# Patient Record
Sex: Female | Born: 1935 | Hispanic: No | Smoking: Former smoker
Health system: Southern US, Community
[De-identification: ages and names within clinical notes are randomized; demographics above are authoritative.]

## PROBLEM LIST (undated history)

## (undated) DIAGNOSIS — R109 Unspecified abdominal pain: Secondary | ICD-10-CM

## (undated) DIAGNOSIS — R51 Headache: Secondary | ICD-10-CM

## (undated) DIAGNOSIS — J301 Allergic rhinitis due to pollen: Secondary | ICD-10-CM

## (undated) DIAGNOSIS — I714 Abdominal aortic aneurysm, without rupture, unspecified: Secondary | ICD-10-CM

## (undated) DIAGNOSIS — G4733 Obstructive sleep apnea (adult) (pediatric): Secondary | ICD-10-CM

## (undated) DIAGNOSIS — I2510 Atherosclerotic heart disease of native coronary artery without angina pectoris: Secondary | ICD-10-CM

## (undated) DIAGNOSIS — K219 Gastro-esophageal reflux disease without esophagitis: Secondary | ICD-10-CM

## (undated) DIAGNOSIS — J209 Acute bronchitis, unspecified: Secondary | ICD-10-CM

## (undated) DIAGNOSIS — I10 Essential (primary) hypertension: Secondary | ICD-10-CM

## (undated) DIAGNOSIS — M1990 Unspecified osteoarthritis, unspecified site: Secondary | ICD-10-CM

## (undated) DIAGNOSIS — E7800 Pure hypercholesterolemia, unspecified: Secondary | ICD-10-CM

## (undated) DIAGNOSIS — N182 Chronic kidney disease, stage 2 (mild): Secondary | ICD-10-CM

## (undated) DIAGNOSIS — I2699 Other pulmonary embolism without acute cor pulmonale: Secondary | ICD-10-CM

## (undated) DIAGNOSIS — R413 Other amnesia: Secondary | ICD-10-CM

## (undated) HISTORY — DX: Abdominal aortic aneurysm, without rupture: I71.4

## (undated) HISTORY — DX: Unspecified osteoarthritis, unspecified site: M19.90

## (undated) HISTORY — DX: Essential (primary) hypertension: I10

## (undated) HISTORY — DX: Pure hypercholesterolemia, unspecified: E78.00

## (undated) HISTORY — DX: Allergic rhinitis due to pollen: J30.1

## (undated) HISTORY — DX: Other pulmonary embolism without acute cor pulmonale: I26.99

## (undated) HISTORY — DX: Other amnesia: R41.3

## (undated) HISTORY — DX: Obstructive sleep apnea (adult) (pediatric): G47.33

## (undated) HISTORY — DX: Acute bronchitis, unspecified: J20.9

## (undated) HISTORY — DX: Unspecified abdominal pain: R10.9

## (undated) HISTORY — DX: Atherosclerotic heart disease of native coronary artery without angina pectoris: I25.10

## (undated) HISTORY — DX: Abdominal aortic aneurysm, without rupture, unspecified: I71.40

## (undated) HISTORY — DX: Chronic kidney disease, stage 2 (mild): N18.2

## (undated) HISTORY — PX: VASCULAR SURGERY: SHX849

---

## 1998-08-04 ENCOUNTER — Ambulatory Visit (HOSPITAL_COMMUNITY): Admission: RE | Admit: 1998-08-04 | Discharge: 1998-08-04 | Payer: Self-pay | Admitting: Internal Medicine

## 1999-03-15 ENCOUNTER — Ambulatory Visit (HOSPITAL_COMMUNITY): Admission: RE | Admit: 1999-03-15 | Discharge: 1999-03-15 | Payer: Self-pay | Admitting: Gastroenterology

## 1999-12-02 ENCOUNTER — Other Ambulatory Visit: Admission: RE | Admit: 1999-12-02 | Discharge: 1999-12-02 | Payer: Self-pay | Admitting: Internal Medicine

## 2001-01-31 ENCOUNTER — Encounter: Payer: Self-pay | Admitting: Emergency Medicine

## 2001-01-31 ENCOUNTER — Emergency Department (HOSPITAL_COMMUNITY): Admission: EM | Admit: 2001-01-31 | Discharge: 2001-01-31 | Payer: Self-pay | Admitting: Emergency Medicine

## 2001-06-01 ENCOUNTER — Encounter: Payer: Self-pay | Admitting: Internal Medicine

## 2001-06-01 ENCOUNTER — Encounter: Admission: RE | Admit: 2001-06-01 | Discharge: 2001-06-01 | Payer: Self-pay | Admitting: Internal Medicine

## 2002-01-08 ENCOUNTER — Encounter: Admission: RE | Admit: 2002-01-08 | Discharge: 2002-01-08 | Payer: Self-pay | Admitting: Internal Medicine

## 2002-01-08 ENCOUNTER — Encounter: Payer: Self-pay | Admitting: Internal Medicine

## 2003-05-05 ENCOUNTER — Ambulatory Visit (HOSPITAL_COMMUNITY): Admission: RE | Admit: 2003-05-05 | Discharge: 2003-05-05 | Payer: Self-pay | Admitting: Cardiology

## 2003-05-05 ENCOUNTER — Encounter: Payer: Self-pay | Admitting: Cardiology

## 2003-11-05 ENCOUNTER — Encounter: Admission: RE | Admit: 2003-11-05 | Discharge: 2003-11-05 | Payer: Self-pay | Admitting: Internal Medicine

## 2004-02-13 ENCOUNTER — Inpatient Hospital Stay (HOSPITAL_COMMUNITY): Admission: EM | Admit: 2004-02-13 | Discharge: 2004-02-23 | Payer: Self-pay | Admitting: Emergency Medicine

## 2004-03-31 ENCOUNTER — Encounter: Admission: RE | Admit: 2004-03-31 | Discharge: 2004-03-31 | Payer: Self-pay | Admitting: Internal Medicine

## 2004-05-03 ENCOUNTER — Encounter: Admission: RE | Admit: 2004-05-03 | Discharge: 2004-05-03 | Payer: Self-pay | Admitting: Internal Medicine

## 2004-10-06 ENCOUNTER — Ambulatory Visit (HOSPITAL_COMMUNITY): Admission: RE | Admit: 2004-10-06 | Discharge: 2004-10-06 | Payer: Self-pay | Admitting: Cardiology

## 2005-02-28 ENCOUNTER — Ambulatory Visit (HOSPITAL_COMMUNITY): Admission: RE | Admit: 2005-02-28 | Discharge: 2005-02-28 | Payer: Self-pay | Admitting: Gastroenterology

## 2005-09-03 ENCOUNTER — Emergency Department (HOSPITAL_COMMUNITY): Admission: EM | Admit: 2005-09-03 | Discharge: 2005-09-03 | Payer: Self-pay | Admitting: Emergency Medicine

## 2006-02-16 ENCOUNTER — Encounter: Admission: RE | Admit: 2006-02-16 | Discharge: 2006-02-16 | Payer: Self-pay | Admitting: Internal Medicine

## 2006-10-10 ENCOUNTER — Ambulatory Visit (HOSPITAL_BASED_OUTPATIENT_CLINIC_OR_DEPARTMENT_OTHER): Admission: RE | Admit: 2006-10-10 | Discharge: 2006-10-10 | Payer: Self-pay | Admitting: Internal Medicine

## 2006-10-15 ENCOUNTER — Ambulatory Visit: Payer: Self-pay | Admitting: Internal Medicine

## 2007-09-03 ENCOUNTER — Encounter: Admission: RE | Admit: 2007-09-03 | Discharge: 2007-09-03 | Payer: Self-pay | Admitting: Internal Medicine

## 2008-01-16 ENCOUNTER — Ambulatory Visit (HOSPITAL_COMMUNITY): Admission: RE | Admit: 2008-01-16 | Discharge: 2008-01-16 | Payer: Self-pay | Admitting: Cardiology

## 2008-01-17 ENCOUNTER — Emergency Department (HOSPITAL_COMMUNITY): Admission: EM | Admit: 2008-01-17 | Discharge: 2008-01-17 | Payer: Self-pay | Admitting: Emergency Medicine

## 2008-03-21 ENCOUNTER — Other Ambulatory Visit: Admission: RE | Admit: 2008-03-21 | Discharge: 2008-03-21 | Payer: Self-pay | Admitting: Internal Medicine

## 2008-11-07 HISTORY — PX: EYE SURGERY: SHX253

## 2009-05-05 ENCOUNTER — Encounter: Admission: RE | Admit: 2009-05-05 | Discharge: 2009-05-05 | Payer: Self-pay | Admitting: Internal Medicine

## 2009-06-01 ENCOUNTER — Encounter: Admission: RE | Admit: 2009-06-01 | Discharge: 2009-06-01 | Payer: Self-pay | Admitting: Internal Medicine

## 2009-07-03 ENCOUNTER — Ambulatory Visit (HOSPITAL_COMMUNITY): Admission: RE | Admit: 2009-07-03 | Discharge: 2009-07-03 | Payer: Self-pay | Admitting: Cardiology

## 2009-09-15 ENCOUNTER — Encounter: Admission: RE | Admit: 2009-09-15 | Discharge: 2009-09-15 | Payer: Self-pay | Admitting: Internal Medicine

## 2009-12-04 ENCOUNTER — Encounter: Admission: RE | Admit: 2009-12-04 | Discharge: 2009-12-04 | Payer: Self-pay | Admitting: Internal Medicine

## 2010-04-08 ENCOUNTER — Ambulatory Visit: Payer: Self-pay | Admitting: Cardiovascular Disease

## 2010-04-08 ENCOUNTER — Inpatient Hospital Stay (HOSPITAL_COMMUNITY): Admission: EM | Admit: 2010-04-08 | Discharge: 2010-04-14 | Payer: Self-pay | Admitting: Emergency Medicine

## 2010-04-09 ENCOUNTER — Encounter (INDEPENDENT_AMBULATORY_CARE_PROVIDER_SITE_OTHER): Payer: Self-pay | Admitting: Internal Medicine

## 2010-04-13 ENCOUNTER — Ambulatory Visit: Payer: Self-pay | Admitting: Thoracic Surgery (Cardiothoracic Vascular Surgery)

## 2010-06-25 ENCOUNTER — Encounter: Admission: RE | Admit: 2010-06-25 | Discharge: 2010-06-25 | Payer: Self-pay | Admitting: Internal Medicine

## 2010-11-04 ENCOUNTER — Encounter
Admission: RE | Admit: 2010-11-04 | Discharge: 2010-11-04 | Payer: Self-pay | Source: Home / Self Care | Attending: Internal Medicine | Admitting: Internal Medicine

## 2010-11-28 ENCOUNTER — Encounter: Payer: Self-pay | Admitting: Internal Medicine

## 2010-11-28 ENCOUNTER — Encounter: Payer: Self-pay | Admitting: Cardiology

## 2011-01-24 LAB — POCT I-STAT, CHEM 8
BUN: 26 mg/dL — ABNORMAL HIGH (ref 6–23)
Calcium, Ion: 1.25 mmol/L (ref 1.12–1.32)
Creatinine, Ser: 1.4 mg/dL — ABNORMAL HIGH (ref 0.4–1.2)
Glucose, Bld: 84 mg/dL (ref 70–99)
Hemoglobin: 12.6 g/dL (ref 12.0–15.0)
Sodium: 140 mEq/L (ref 135–145)
TCO2: 28 mmol/L (ref 0–100)

## 2011-01-24 LAB — LIPID PANEL
Cholesterol: 153 mg/dL (ref 0–200)
HDL: 75 mg/dL (ref >39)
LDL Cholesterol: 67 mg/dL (ref 0–99)
Triglycerides: 54 mg/dL (ref <150)

## 2011-01-24 LAB — DIFFERENTIAL
Basophils Relative: 0 % (ref 0–1)
Lymphocytes Relative: 14 % (ref 12–46)
Lymphs Abs: 0.9 10*3/uL (ref 0.7–4.0)
Monocytes Absolute: 0.2 10*3/uL (ref 0.1–1.0)
Monocytes Relative: 3 % (ref 3–12)
Neutro Abs: 5.4 10*3/uL (ref 1.7–7.7)
Neutrophils Relative %: 83 % — ABNORMAL HIGH (ref 43–77)

## 2011-01-24 LAB — CBC
Hemoglobin: 12 g/dL (ref 12.0–15.0)
MCHC: 34.4 g/dL (ref 30.0–36.0)
Platelets: 137 10*3/uL — ABNORMAL LOW (ref 150–400)
Platelets: 143 10*3/uL — ABNORMAL LOW (ref 150–400)
RBC: 3.67 MIL/uL — ABNORMAL LOW (ref 3.87–5.11)
RBC: 3.74 MIL/uL — ABNORMAL LOW (ref 3.87–5.11)
WBC: 4.6 10*3/uL (ref 4.0–10.5)
WBC: 5.1 10*3/uL (ref 4.0–10.5)
WBC: 6.5 10*3/uL (ref 4.0–10.5)

## 2011-01-24 LAB — PROTIME-INR
INR: 1.81 — ABNORMAL HIGH (ref 0.00–1.49)
INR: 2.4 — ABNORMAL HIGH (ref 0.00–1.49)
INR: 2.74 — ABNORMAL HIGH (ref 0.00–1.49)
INR: 2.86 — ABNORMAL HIGH (ref 0.00–1.49)
Prothrombin Time: 20.8 seconds — ABNORMAL HIGH (ref 11.6–15.2)
Prothrombin Time: 21.3 seconds — ABNORMAL HIGH (ref 11.6–15.2)
Prothrombin Time: 28.8 seconds — ABNORMAL HIGH (ref 11.6–15.2)

## 2011-01-24 LAB — COMPREHENSIVE METABOLIC PANEL
AST: 28 U/L (ref 0–37)
Albumin: 3.4 g/dL — ABNORMAL LOW (ref 3.5–5.2)
Alkaline Phosphatase: 42 U/L (ref 39–117)
Chloride: 108 mEq/L (ref 96–112)
GFR calc Af Amer: 49 mL/min — ABNORMAL LOW (ref >60)
Potassium: 3.8 mEq/L (ref 3.5–5.1)
Sodium: 138 mEq/L (ref 135–145)
Total Bilirubin: 0.6 mg/dL (ref 0.3–1.2)

## 2011-01-24 LAB — POCT CARDIAC MARKERS: Myoglobin, poc: 140 ng/mL (ref 12–200)

## 2011-01-24 LAB — HEPATIC FUNCTION PANEL
ALT: 14 U/L (ref 0–35)
AST: 20 U/L (ref 0–37)
Albumin: 3.7 g/dL (ref 3.5–5.2)
Alkaline Phosphatase: 44 U/L (ref 39–117)
Bilirubin, Direct: 0.1 mg/dL (ref 0.0–0.3)
Total Bilirubin: 0.7 mg/dL (ref 0.3–1.2)

## 2011-01-24 LAB — URINALYSIS, ROUTINE W REFLEX MICROSCOPIC
Glucose, UA: NEGATIVE mg/dL
Ketones, ur: NEGATIVE mg/dL
Nitrite: NEGATIVE
Protein, ur: NEGATIVE mg/dL
pH: 6 (ref 5.0–8.0)

## 2011-01-24 LAB — CULTURE, BLOOD (ROUTINE X 2): Culture: NO GROWTH

## 2011-01-24 LAB — D-DIMER, QUANTITATIVE: D-Dimer, Quant: 0.72 ug/mL-FEU — ABNORMAL HIGH (ref 0.00–0.48)

## 2011-01-24 LAB — BASIC METABOLIC PANEL
CO2: 28 mEq/L (ref 19–32)
Chloride: 106 mEq/L (ref 96–112)
GFR calc Af Amer: 53 mL/min — ABNORMAL LOW (ref >60)
Potassium: 4 mEq/L (ref 3.5–5.1)

## 2011-01-24 LAB — URINE MICROSCOPIC-ADD ON

## 2011-01-24 LAB — URINE CULTURE: Colony Count: 50000

## 2011-01-24 LAB — CARDIAC PANEL(CRET KIN+CKTOT+MB+TROPI)
CK, MB: 1.2 ng/mL (ref 0.3–4.0)
Relative Index: 1.1 (ref 0.0–2.5)
Total CK: 105 U/L (ref 7–177)
Troponin I: 0.06 ng/mL (ref 0.00–0.06)

## 2011-01-24 LAB — CK TOTAL AND CKMB (NOT AT ARMC)
CK, MB: 1.2 ng/mL (ref 0.3–4.0)
Total CK: 97 U/L (ref 7–177)

## 2011-01-24 LAB — TSH: TSH: 0.59 u[IU]/mL (ref 0.350–4.500)

## 2011-02-18 ENCOUNTER — Encounter: Payer: Self-pay | Admitting: Internal Medicine

## 2011-03-25 NOTE — Discharge Summary (Signed)
Alexandria Walton, Alexandria Walton                          ACCOUNT NO.:  [id number]   MEDICAL RECORD NO.:  [id number]                   PATIENT TYPE:  INP   LOCATION:  3727                                 FACILITY:  [facility]   PHYSICIAN:  Mohan N. Sharyn Lull, M.D.              DATE OF BIRTH:  [birth year]   DATE OF ADMISSION:  02/13/2004  DATE OF DISCHARGE:  02/23/2004                                 DISCHARGE SUMMARY   ADMISSION DIAGNOSES:  1. New onset angina, rule out myocardial infarction.  2. Hypertension.  3. Hypercholesterolemia.  4. Positive family history of coronary artery disease.   DISCHARGE DIAGNOSES:  1. Mild coronary artery disease.  2. Bilateral pulmonary embolism.  3. Hypertension.  4. Diffuse thoracic aortic aneurysm.  5. Hypercholesterolemia.  6. Hyperhomocysteinemia.  7. History of allergic rhinitis.   DISCHARGE MEDICATIONS:  1. Baby aspirin 81 mg one tablet daily.  2. Coumadin 4 mg one tablet daily.  3. Toprol-XL 50 mg one tablet daily.  4. Crestor 10 mg one tablet daily.  5. Protonix 40 mg one tablet daily.  6. Feosol 325 mg one tablet daily.  7. Allegra 60 mg p.o. b.i.d. as needed as before.   ACTIVITY:  As tolerated.   DIET:  Low salt, low cholesterol.   FOLLOW UP:  Blood work-CBC and protime in one week. Follow up with me in one  week and Dr. Willey Blade as scheduled.   CONDITION ON DISCHARGE:  Stable.   BRIEF HISTORY AND HOSPITAL COURSE:  [name] is a 77 year old black female  with past medical history significant for hypertension,  hypercholesterolemia, history of allergic rhinitis, positive family history  of coronary artery disease who came to the ER complaining of retrosternal  chest heaviness associated with diaphoresis, __________ 2/10, radiating to  both arms. She took baby aspirin and Norvasc with relief of chest heaviness.  States she had heaviness twice, each time lasting a few minutes. Denies any  nausea or vomiting, shortness of breath. Denies  palpitations,  lightheadedness, or syncope. Denies PND, orthopnea, leg swelling. The  patient was noted to have mildly elevated troponin I. EKG showed normal  sinus rhythm with no acute ST-T wave changes. The patient presently denies  any chest pain when seen in her ER.   PAST MEDICAL HISTORY:  As above.   PAST SURGICAL HISTORY:  1. She had left inguinal hernia repair many years ago.  2. Had right wrist surgery secondary to fracture 25 years ago.  3. Also had sternal fracture approximately 10 years ago.   ALLERGIES:  She has intolerance to CODEINE and LIPITOR.   MEDICATIONS:  1. Crestor 10 mg p.o. q.d.  2. Baby aspirin 81 mg p.o. q.d.  3. Norvasc 5 mg p.o. q.d.  4. Allegra 60 mg p.o. b.i.d. p.r.n.   SOCIAL HISTORY:  She is widowed. Has six children. Smokes half pack per day  for 35 years, quit  in 1993. No history of alcohol abuse. She is retired.  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   FAMILY HISTORY:  Father died at the age of 47, cause not known. Mother died  of complications of congestive heart failure at the age of 63. One sister is  hypertensive. Grandparents, her mother's side had coronary artery disease at  young age.   PHYSICAL EXAMINATION:  GENERAL:  She was alert, awake, oriented x3 in no  acute distress.  VITAL SIGNS:  Blood pressure was 120/82, pulse was 90.  HEENT:  Conjunctivae was pink.  NECK:  Supple. No JVD. No bruit.  LUNGS:  Clear to auscultation without rhonchi or rales.  CARDIOVASCULAR:  S1 and S2 was normal. There was soft S4 gallop and soft  systolic murmur at the apex.  ABDOMEN:  Soft, bowel sounds are present, nontender.  EXTREMITIES:  There is no clubbing, cyanosis, or edema.   LABORATORY DATA:  EKG showed normal sinus rhythm with no acute ischemic  changes. Her point of care myoglobins were normal, 86, 112, and 95. CPK-MBs  were normal also, 1.0, 1.3, 2.6; however, troponin I was slightly elevated  0.05, 0.13, and 0.29. C-reactive protein was  slightly elevated. It was 0.8.  Cholesterol was 248. LDL was 178 which was very elevated. HDL was 60. Three  sets of cardiac enzymes:  CK was 125, MB 4.1; second set CK 111, MB 3.3;  third set CK 91, MB 2.0 which were all negative; however, troponin I was  elevated 1.28. Her homocysteine level was also elevated, 15.5. Sodium was  141, potassium 4.2, chloride 112, CO2 23, glucose 67, BUN 12, creatinine  1.4. Her hemoglobin was 11.8, hematocrit 34.8, white count of 6.2.   BRIEF HOSPITAL COURSE:  The patient was admitted to telemetry unit.  Discussed with patient regarding left catheterization, possible PTCA  stenting, as patient had mildly elevated troponin I and typical anginal  chest pain. The patient consented for the procedure. The patient  subsequently underwent left catheterization on April 11 which showed mild  coronary artery disease as per procedure report. Aortography was also done  which showed diffuse enlargement of the aorta from ascending aorta through  the descending thoracic aorta and also focal abdominal aortic aneurysm.  Bilateral renal arteries were patent. The patient subsequently underwent CT  of the chest to evaluate the significance of thoracic and abdominal aneurysm  which suggested the aneurysm size was approximately 4.5 cm. CT of the chest  also suggested patient had bilateral pulmonary emboli with small multiple  nodules in the lung which were felt to be due to granulomatous disease.  Critical care medicine consultation was also obtained by Dr. Sung Amabile who  felt that no further workup except for repeat follow-up chest x-ray in three  to six months and then every year is warranted. The patient was started on  heparin post catheterization and Coumadin. Her INR today is 2.2 in the  therapeutic range. The patient also had Duplex ultrasound of the lower  extremities which showed no evidence of deep vein thrombosis. The patient will be discharged home on the above  medications and will be followed in my  office in one week and Dr. Willey Blade as scheduled as outpatient.  Eduardo Osier. Sharyn Lull, M.D.    MNH/MEDQ  D:  02/23/2004  T:  02/24/2004  Job:  [id number]   cc:   Minerva Areola L. August Saucer, M.D.  [address] [address]  [place]  [place] [address]  Fax: [phone]

## 2011-03-25 NOTE — Procedures (Signed)
NAMESHERAE, Alexandria Walton                ACCOUNT NO.:  [id number]   MEDICAL RECORD NO.:  [id number]          PATIENT TYPE:  OUT   LOCATION:  SLEEP CENTER                 FACILITY:  [facility]   PHYSICIAN:  Clinton D. Maple Hudson, MD, FCCP, FACPDATE OF BIRTH:  June 20, 1931   DATE OF STUDY:  10/10/2006                            NOCTURNAL POLYSOMNOGRAM   REFERRING PHYSICIAN:  Eric L. August Saucer, M.D.   INDICATIONS FOR STUDY:  Hypersomnia with sleep apnea.  Epworth  sleepiness score 11/24.  BMI 24.1.  Weight 169 pounds.   HOME MEDICATIONS:  Crestor, aspirin, metoprolol, warfarin, Aricept.   SLEEP ARCHITECTURE:  Total sleep time 293 minutes with a sleep  efficiency of 69%. Stage I was 4%, stage II 79%, stages III and IV  absent. REM 17% of total sleep time. Sleep latency 19 minutes. REM  latency 120 minutes. Awake after sleep onset 112 minutes reflecting  frequent awakenings.  Arousal index 8.2.  Sleep onset at 10:18 p.m.,  awakening 5:05 a.m.  There was prolonged waking between 1:45 and 3:00  a.m.   RESPIRATORY DATA:  Apnea/hypopnea index (AHI, RDI): 25.8 obstructive  events per hour indicating moderate obstructive sleep apnea/hypopnea  syndrome. There were 113 obstructive apneas, and 13 hypopneas. Events  were strongly positional with very few recorded once she shifted off of  her back. There were insufficient early events to meet criteria for CPAP  titration by split protocol on this study night.   OXYGEN DATA:  Mild to moderate snoring with oxygen desaturation to a  nadir of 82%. Mean oxygen saturation through the study was 92% on room  air.   CARDIAC DATA:  Normal sinus rhythm.   MOVEMENT/PARASOMNIA:  None significant.   IMPRESSION/RECOMMENDATIONS:  1. Moderate obstructive sleep apnea/hypopnea syndrome, AHI 25.8 per      hour with strongly positional events, almost exclusively while      sleeping supine.  Mild to moderate snoring with oxygen desaturation      to a nadir of 82%.  2. CPAP can be a  therapeutic option, and it may be appropriate for her      to return for CPAP titration.  Respecting      her age and the clear positional association of her events, it      would be appropriate first to try encouraging her to sleep off the      flat of her back at home.      Clinton D. Maple Hudson, MD, [facility], FACP  Diplomate, Biomedical engineer of Sleep Medicine  Electronically Signed     CDY/MEDQ  D:  10/15/2006 11:40:43  T:  10/15/2006 18:41:18  Job:  [id number]   cc:   Minerva Areola L. August Saucer, M.D.  Fax: [phone]

## 2011-03-25 NOTE — Op Note (Signed)
Alexandria Walton, Alexandria Walton                ACCOUNT NO.:  [id number]   MEDICAL RECORD NO.:  [id number]          PATIENT TYPE:  AMB   LOCATION:  ENDO                         FACILITY:  [facility]   PHYSICIAN:  Bernette Redbird, M.D.   DATE OF BIRTH:  December 09, 1928   DATE OF PROCEDURE:  02/28/2005  DATE OF DISCHARGE:                                 OPERATIVE REPORT   PROCEDURE:  Upper endoscopy.   INDICATIONS FOR PROCEDURE:  Reflux symptoms and mild anemia (hemoglobin  11.4) in this 77 year old female.   FINDINGS:  Moderate sized hiatal hernia with esophageal ring.   PROCEDURE:  The nature, purpose and risks of the procedure had been  discussed with the patient who provided written consent.  Sedation for this  procedure and the colonoscopy which followed it, total Fentanyl 50 mcg and  Versed 6 mg IV (for this procedure she received Fentanyl 30 mcg and Versed 4  mg IV).   The Olympus video endoscope was passed under direct vision.  The vocal cords  looked normal and the esophagus was readily entered.   Note that this procedure was done with the patient off Coumadin for  approximately four days prior to the procedure with a normal INR of 1.2 at  the time of the procedure.  Dr. Sharyn Lull had okayed her going off the  Coumadin, which she takes because of a prior history of a pulmonary  embolism.   The esophagus was endoscopically normal.  There was no evidence of reflux  esophagitis, Barrett's esophagus, varices, infection, or neoplasia and no  free reflux was seen.   However, there was a prominent esophageal mucosal ring at the squamocolumnar  junction at 35 cm and below this was a 5 cm hiatal hernia with a  diaphragmatic hiatus at 40 cm.   The stomach contained no significant residual and had normal mucosa without  evidence of gastritis, erosions, ulcers, polyps or masses, despite the fact  that the patient takes a daily 81 mg aspirin (note that she is also on  Protonix, however).   Retroflexed  viewing of the proximal stomach showed that the diaphragmatic  hiatus was fairly patulous, probably about 6 cm across.   The pylorus, duodenal bulb and second duodenum looked normal.   The scope was removed from the patient.  No biopsies were obtained.  She  tolerated the procedure well and there were no apparent complications.   IMPRESSION:  1.  Reflux symptoms without esophagitis on current examination (530.81).  2.  Moderately large hiatal hernia which might account for the patient's      anemia, and which probably contributes to her reflux symptomatology.  3.  Prominent esophageal ring.  Retrospectively, the patient does note some      intermittent dysphagia symptoms which are probably resulting from this      ring.   PLAN:  Continue Protonix.  Consider esophageal dilatation if the dysphagia  symptoms progress.  No endoscopic contraindication continued use of aspirin  and Coumadin based on the appearance of her stomach on this exam.   Proceed to colonoscopic evaluation.  RB/MEDQ  D:  02/28/2005  T:  02/28/2005  Job:  [id number]   cc:   Minerva Areola L. August Saucer, M.D.  [address] [address]  [place]  [place] [address]  Fax: [phone]   Eduardo Osier. Harwani, M.D.  [address]     [address]  [place]  [place] [address]  Fax: [phone]

## 2011-03-25 NOTE — Op Note (Signed)
Alexandria Walton, Alexandria Walton                ACCOUNT NO.:  [id number]   MEDICAL RECORD NO.:  [id number]          PATIENT TYPE:  AMB   LOCATION:  ENDO                         FACILITY:  [facility]   PHYSICIAN:  Bernette Redbird, M.D.   DATE OF BIRTH:  13-Dec-1928   DATE OF PROCEDURE:  02/28/2005  DATE OF DISCHARGE:                                 OPERATIVE REPORT   PROCEDURE:  Colonoscopy.   INDICATION:  Prior history of apparent colon polyps in a 77 year old female  with surveillance colonoscopy by me five years ago having been negative.  Also,  mild anemia (hemoglobin 11.4).  Stools were heme negative as an  outpatient on home Hemoccults.   FINDINGS:  Scattered diverticulosis.   DESCRIPTION OF PROCEDURE:  The nature, purpose and risks of the procedure  were familiar to the patient, who provided written consent.  Total sedation  for this procedure and the upper endoscopy which preceded it was fentanyl 50  mcg and Versed 6 mg IV without arrhythmias or desaturation.   The Olympus adult adjustable tension video colonoscope was readily advanced  to the cecum and, I believe, for a short distance into a normal-appearing  terminal ileum after which pullback was performed.  The quality of the prep  was excellent and it was felt that all areas were well seen.   There was some scattered diverticulosis on the exam in both the proximal and  the distal colon, but no polyps, cancer, colitis or vascular malformations  were observed and retroflexion in the rectum and reinspection of the rectum  were unremarkable.   No biopsies were obtained.  Note that this procedure was done with the  patient off Coumadin, which she takes because of a prior history of a  pulmonary embolism.  The patient's cardiologist had said it was okay for her  to go off for several days prior to the procedure.   IMPRESSION:  1.  Prior history of colon polyps without worrisome findings on current exam      (B12.72).  2.  Mild anemia  without evident source on current examination (hemoglobin      11.4).   PLAN:  Followup colonoscopy in five years because of the apparent prior  history of colon polyps (details not available).      RB/MEDQ  D:  02/28/2005  T:  02/28/2005  Job:  [id number]   cc:   Minerva Areola L. August Saucer, M.D.  [address] [address]  [place]  [place] [address]  Fax: [phone]   Eduardo Osier. Harwani, M.D.  [address]     [address]  [place]  [place] [address]  Fax: [phone]

## 2011-03-25 NOTE — Cardiovascular Report (Signed)
Alexandria Walton, Alexandria Walton                          ACCOUNT NO.:  [id number]   MEDICAL RECORD NO.:  [id number]                   PATIENT TYPE:  INP   LOCATION:  3727                                 FACILITY:  [facility]   PHYSICIAN:  Mohan N. Sharyn Lull, M.D.              DATE OF BIRTH:  1929-10-16   DATE OF PROCEDURE:  02/17/2004  DATE OF DISCHARGE:                              CARDIAC CATHETERIZATION   PROCEDURE:  Left cardiac catheterization with selective left and right  coronary angiography and left ventriculography, aortography via right groin  using Judkins technique.   INDICATIONS:  [name] is a 77 year old black female with past medical  history significant for hypertension, hypercholesterolemia, history of  allergic rhinitis, positive family history of coronary artery disease.  She  came to the emergency room complaining of retrosternal chest heaviness  associated with diaphoresis grade 8/10 radiating to both arms.  She took  baby aspirin and Norvasc with relief.  States chest heaviness x2, each time  lasted a few minutes.  Denies any nausea, vomiting, diaphoresis.  Denies  palpitation, lightheadedness or syncope.  Denies PND, orthopnea, leg  swelling.  The patient was noted to have mildly elevated troponin I.  EKG  showed no acute ischemic changes.  The patient was presently chest pain free  when seen in the emergency room.   PAST MEDICAL HISTORY:  She had left inguinal repair in the past, had right  wrist surgery secondary to fracture about 25 years ago.  Also, history of  sternal fracture approximately 10 years ago.   ALLERGIES:  She has intolerance to LIPITOR AND CODEINE.   MEDICATIONS AT HOME:  1. Crestor 10 mg p.o. daily.  2. Baby aspirin 81 mg p.o. daily.  3. Norvasc 5 mg p.o. daily.  4. Allegra 60 mg p.o. b.i.d. on p.r.n. basis.   SOCIAL HISTORY:  She is widowed and has six children.  Smoked half pack per  day for 35 years, quit in 1993.  No history of alcohol abuse.   She is  retired.  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   FAMILY HISTORY:  Mother died of complications of congestive heart failure at  the age of 80.  Father died at the age of 62 the cause not known.  One  sister is hypertensive.  Grandparents had coronary artery disease from  mother's side.   PHYSICAL EXAMINATION:  GENERAL:  She was alert, awake, oriented x3 in no  acute distress.  VITAL SIGNS:  Blood pressure 120/82, pulse 90 and regular.  HEENT:  Conjunctivae pink.  NECK:  Supple.  No jugular venous distention.  No bruit.  LUNGS:  Clear to auscultation without rhonchi or rales.  CARDIOVASCULAR:  S1, S2 was normal.  There was soft S4 gallop and systolic  murmur at the apex.  ABDOMEN:  Soft.  Bowel sounds are present.  Nontender.  EXTREMITIES:  No clubbing,  cyanosis or edema.   LABORATORY DATA:  EKG showed normal sinus rhythm with no acute ischemic  changes.  Her troponin I was slightly elevated at 0.12 and 0.29.  Her three  sets of CPK-MBs were negative.  Due to typical anginal chest pain, multiple  risk factors and minimally elevated troponin I, discussed with patient  regarding left cath, possible percutaneous transluminal coronary  angioplasty, stenting, its risks, i.e., death, myocardial infarction,  stroke, need for emergency coronary artery bypass graft, risk of restenosis,  local vascular complications, etc., and consented for the procedure.   PROCEDURE:  After obtaining informed consent, the patient was brought to the  cath lab and was placed on fluoroscopy table. Right groin was prepped and  draped in usual fashion.  2% Xylocaine was used for local anesthesia in the  right groin.  With the help of thin-wall needle, a 6-French arterial sheath  was placed.  The sheath was aspirated and flushed.  Next, 6-French left  Judkins catheter was advanced over the wire under fluoroscopic guidance up  to the ascending aorta.  Wire was pulled out and the catheter was  aspirated  and connected to the manifold. Catheter was further advanced and engaged  into left coronary ostium.  Multiple views of the left system were taken.  Next, the catheter was disengaged and was pulled out over the wire and was  replaced with 6-French right Judkins catheter which was advanced over the  wire under fluoroscopic guidance to the ascending aorta.  Wire was pulled  out, the catheter was aspirated and connected to the manifold.  Catheter was  further advanced and engaged into right coronary ostium.  Multiple views of  the right system were taken.  Next, the catheter was disengaged and was  pulled out over the wire and was replaced with 6-French pigtail catheter  which was advanced over wire under fluoroscopic guidance up to the ascending  aorta.  Catheter was further advanced across the aortic valve into the left  ventricle.  Left ventricular pressures were recorded.  Next, left  ventriculography was done in 30-degree RAO position.  Post angiographic  pressures were recorded from LV and then pullback pressures were recorded  from the aorta.  There was no gradient across the aortic valve.  Next, the  pigtail catheter was pulled down into descending thoracic aorta.  Aortography was done in PA position.  Next, the pigtail catheter was pulled  out over the wire, sheaths aspirated and flushed.   FINDINGS:  The LV showed good LV systolic function, EF of 55-60%.  Left main  was patent.  LAD has 10-20% proximal and mid stenosis.  Diagonal one has 40-  50% ostial stenosis.  Ramus was small which was patent.  Left circumflex was  large which was patent.  OM-1 to OM-3 were very small which were patent.  RCA has 10-15% proximal stenosis.  Aortography showed diffuse enlargement of  aorta from ascending aorta to descending thoracic aorta and there was focal  abdominal aortic aneurysm.  Bilateral renal arteries are patent.  The patient tolerated the procedure well.  There were no  complications.  Plan is  to increase the beta blockers.  Will get CVTS consult and MRA of the  thoracic and abdominal aorta to further evaluate the size of the aneurysm  and further interventions.  The patient was transferred to recovery room in  stable condition.  Eduardo Osier. Sharyn Lull, M.D.    MNH/MEDQ  D:  02/17/2004  T:  02/18/2004  Job:  [id number]   cc:   Minerva Areola L. August Saucer, M.D.  [address] [address]  [place]  [place] [address]  Fax: [phone]

## 2011-03-25 NOTE — Consult Note (Signed)
Alexandria Walton, Alexandria Walton                          ACCOUNT NO.:  [id number]   MEDICAL RECORD NO.:  [id number]                   PATIENT TYPE:  INP   LOCATION:  3727                                 FACILITY:  [facility]   PHYSICIAN:  Oley Balm. Sung Amabile, M.D. [facility]          DATE OF BIRTH:  Mar 10, 1929   DATE OF CONSULTATION:  02/18/2004  DATE OF DISCHARGE:                                   CONSULTATION   PROBLEM LIST:  1. Pulmonary embolism.  2. Pulmonary nodules.   HISTORY OF PRESENT ILLNESS:  [name] is a 77 year old woman  with a past  medical history of hypertension and remote smoking. She presented on February 13, 2004 with a four hour history of substernal chest discomfort radiating to  both arms. She described it as burning. She was admitted with the  diagnosis of unstable angina, rule out myocardial infarction. She did rule  out by cardiac enzymes. She underwent cardiac catheterization which  demonstrated non-obstructive coronary disease. Aortogram demonstrated some  dilatation of the ascending aorta. She underwent CT angiogram, which  demonstrated an unexpected diagnosis of pulmonary embolism. Also  demonstrated were subtle pulmonary nodules as described below. Consequently  I am asked to evaluate her further. At the present time she feels markedly  better with no further chest pain or discomfort. She denies cough,  hemoptysis, pleurodynia. She has had no recent leg trauma. She does have  chronic venous stasis disease in her left lower extremity with severe  varicosities.   PAST MEDICAL HISTORY:  Notable predominantly for hypertension, otherwise as  documented in the history of present illness.   SOCIAL HISTORY:  She smoked a half a pack of cigarettes a day for 35 years,  but quit 12 years ago. She has no history of prior pulmonary disease related  to this.   FAMILY HISTORY:  No history of thrombophilia. Otherwise noncontributory.   REVIEW OF SYSTEMS:  As per the history of present  illness.   PHYSICAL EXAMINATION:  VITAL SIGNS:  She is afebrile with normal vital signs  and appears in no acute cardiac or respiratory distress.  HEENT:  No acute abnormalities.  NECK:  Supple without adenopathy or jugular vein distention.  CHEST:  Normal percussion throughout. Breath sounds are full without wheezes  or other adventitious sounds.  CARDIAC:  Regular rate and rhythm with no murmurs.  ABDOMEN:  Moderately obese, soft, nontender, with normal bowel sounds.  EXTREMITIES:  Without clubbing or cyanosis. She has extensive varicosities  in the left lower extremity, but no pitting edema.  NEUROLOGIC:  No focal deficits.   LABORATORY DATA:  CT scan of the chest demonstrate pulmonary embolism  predominantly in the right upper lobe, but also a clot extending down into  the right lower lobe artery. There is a smaller fragment of clot on the left  side. Lung parenchyma reveals some dependent atelectasis. There is a  definite nodule, which I  believe is in the right upper lobe of approximately  1 cm in diameter. It is well-circumscribed. The radiologist also makes note  of several small nodules, predominantly in the lower lobes. These are  approximately 4 mm in diameter at lowest, on my interpretation they are very  unimpressive.   IMPRESSION:  1. Pulmonary embolism - Most likely this is related to chronic venous stasis     disease.  2. Pulmonary nodules - They have a very unimpressive appearance to me and I     think malignancy is unlikely. I do not think any further evaluation is     indicated at this time.  3. Chronic venous stasis disease.   PLAN/RECOMMENDATION:  I agree with her current management including  anticoagulation with heparin. Coumadin can be started at any time from my  perspective. The usual overlap of two days of therapeutic prothrombin time  is recommended. I believe that she needs life long anticoagulation given the  fact that her risk factor (venous stasis)  is irreversible. Target INR would  be 2-3 for the next six months, would make the target range 2.0-2.5 after  six months. With regard to the pulmonary nodules, I recommend nothing more  than serial chest x-rays. I suggest a chest x-ray every six months for the  next two years and annually thereafter if none of the nodules become evident  on chest x-ray, no further evaluation will be indicated.                                               Oley Balm Sung Amabile, M.D. [facility]    DBS/MEDQ  D:  02/18/2004  T:  02/19/2004  Job:  [id number]   cc:   Minerva Areola L. August Saucer, M.D.  [address] [address]  [place]  [place] [address]  Fax: [phone]   Eduardo Osier. Sharyn Lull, M.D.  [address]  [place]  [place] [address]  Fax: [phone]

## 2011-04-26 ENCOUNTER — Other Ambulatory Visit: Payer: Self-pay | Admitting: Internal Medicine

## 2011-04-26 ENCOUNTER — Ambulatory Visit
Admission: RE | Admit: 2011-04-26 | Discharge: 2011-04-26 | Disposition: A | Discharge status: Home / Self Care | Payer: Medicare Other | Source: Ambulatory Visit | Attending: Internal Medicine | Admitting: Internal Medicine

## 2011-04-26 DIAGNOSIS — R634 Abnormal weight loss: Secondary | ICD-10-CM

## 2011-08-01 LAB — COMPREHENSIVE METABOLIC PANEL
ALT: 14
AST: 22
Alkaline Phosphatase: 52
GFR calc Af Amer: 53 — ABNORMAL LOW
Glucose, Bld: 95
Potassium: 4.1
Sodium: 141
Total Protein: 7.7

## 2011-08-01 LAB — URINALYSIS, ROUTINE W REFLEX MICROSCOPIC
Ketones, ur: NEGATIVE
Nitrite: NEGATIVE
Specific Gravity, Urine: 1.014
pH: 7.5

## 2011-08-01 LAB — CBC
Hemoglobin: 12.3
RBC: 3.9
RDW: 14.1

## 2011-08-01 LAB — DIFFERENTIAL
Basophils Relative: 0
Eosinophils Absolute: 0
Eosinophils Relative: 1
Monocytes Absolute: 0.4
Monocytes Relative: 7
Neutrophils Relative %: 78 — ABNORMAL HIGH

## 2011-09-20 ENCOUNTER — Other Ambulatory Visit: Payer: Self-pay | Admitting: Internal Medicine

## 2011-09-20 DIAGNOSIS — R413 Other amnesia: Secondary | ICD-10-CM

## 2011-09-22 ENCOUNTER — Other Ambulatory Visit: Payer: Medicare Other

## 2011-09-26 ENCOUNTER — Ambulatory Visit
Admission: RE | Admit: 2011-09-26 | Discharge: 2011-09-26 | Disposition: A | Discharge status: Home / Self Care | Payer: Medicare Other | Source: Ambulatory Visit | Attending: Internal Medicine | Admitting: Internal Medicine

## 2011-09-26 DIAGNOSIS — R413 Other amnesia: Secondary | ICD-10-CM

## 2012-02-13 ENCOUNTER — Other Ambulatory Visit: Payer: Self-pay | Admitting: Ophthalmology

## 2012-02-21 ENCOUNTER — Encounter (HOSPITAL_COMMUNITY): Payer: Self-pay | Admitting: Pharmacy Technician

## 2012-02-29 ENCOUNTER — Encounter (HOSPITAL_COMMUNITY)
Admission: RE | Admit: 2012-02-29 | Discharge: 2012-02-29 | Disposition: A | Discharge status: Home / Self Care | Payer: Medicare Other | Source: Ambulatory Visit | Attending: Ophthalmology | Admitting: Ophthalmology

## 2012-02-29 ENCOUNTER — Encounter (HOSPITAL_COMMUNITY): Payer: Self-pay

## 2012-02-29 HISTORY — DX: Headache: R51

## 2012-02-29 HISTORY — DX: Gastro-esophageal reflux disease without esophagitis: K21.9

## 2012-02-29 LAB — BASIC METABOLIC PANEL
GFR calc Af Amer: 43 mL/min — ABNORMAL LOW (ref >90)
GFR calc non Af Amer: 37 mL/min — ABNORMAL LOW (ref >90)
Potassium: 4.7 mEq/L (ref 3.5–5.1)
Sodium: 140 mEq/L (ref 135–145)

## 2012-02-29 LAB — CBC
Hemoglobin: 11.2 g/dL — ABNORMAL LOW (ref 12.0–15.0)
MCHC: 32 g/dL (ref 30.0–36.0)
RBC: 3.7 MIL/uL — ABNORMAL LOW (ref 3.87–5.11)

## 2012-02-29 NOTE — Progress Notes (Signed)
Spoke to Robin at Dr Linton Flemings office... They will call scheduling at [facility] to change dos to May 8th  Because pts family cannot be here on May 1st..  Robin states Gerlene Burdock will change this  Surgery to the 8th  .Marland KitchenMarland Kitchen

## 2012-02-29 NOTE — Pre-Procedure Instructions (Signed)
Alexandria Walton  02/29/2012   Your procedure is scheduled on:  YOUR SURGERY WILL BE RESCHEDULED TO MAY 8TH  .. DR Linton Flemings OFFICE WILL CALL YOU TO CONFIRM THE TIME AND DATE.  Report to [facility] at  AM.  Call this number if you have problems the morning of surgery: [phone]   Remember:   Do not eat food:After Midnight.  May have clear liquids: up to 4 Hours before arrival.  Clear liquids include soda, tea, black coffee, apple or grape juice, broth.  Take these medicines the morning of surgery with A SIP OF WATER: METOPROLOL (LOPRESSOR)   Do not wear jewelry, make-up or nail polish.  Do not wear lotions, powders, or perfumes. You may wear deodorant.  Do not shave 48 hours prior to surgery.  Do not bring valuables to the hospital.  Contacts, dentures or bridgework may not be worn into surgery.  Leave suitcase in the car. After surgery it may be brought to your room.  For patients admitted to the hospital, checkout time is 11:00 AM the day of discharge.   Patients discharged the day of surgery will not be allowed to drive home.  Name and phone number of your driver: Alexandria Walton - DAUGHTER  Special Instructions: CHG Shower Use Special Wash: 1/2 bottle night before surgery and 1/2 bottle morning of surgery.   Please read over the following fact sheets that you were given: Pain Booklet, Coughing and Deep Breathing and Surgical Site Infection Prevention

## 2012-02-29 NOTE — Progress Notes (Signed)
SPOKE WITH ROBIN AT DR [facility] OFFICE AGAIN ... THEY WILL CALL PT WITH NEW TIME AND DATE FOR HER SURGERY .

## 2012-03-01 ENCOUNTER — Encounter (HOSPITAL_COMMUNITY): Payer: Self-pay | Admitting: Vascular Surgery

## 2012-03-01 NOTE — Consult Note (Addendum)
Anesthesia Chart Review:  Patient is an 78 year old female scheduled for left eye cataract extraction with intraocular lens placement on 03/14/12.  History includes HTN, PE '05 non-smoker, CKD Stage II, headaches, GERD, moderate OSA by sleep study in '07, hypercholesterolemia, thoracic ascending aorta.  Dr. Willey Blade is her PCP.  EKG on 02/29/12 showed NSR, moderate LVH.  Not significantly changed since 04/13/10.  Echo on 04/09/10 showed LV: distal septal hypokinesis, inferobasal akinesis, moderate LVH, EF 50-55%, mild AR, aorta moderately dilated, mild MR, LA mildly dilated, PA peak pressure 34 mm Hg.  Nuclear stress test on 01/16/08 showed: Normal myocardial perfusion exam. Ejection fraction is 67%.   Cardiac cath on 02/17/04 showed (Dr. Sharyn Lull): The LV showed good LV systolic function, EF of 55-60%. Left main was patent. LAD has 10-20% proximal and mid stenosis. Diagonal one has 40- 50% ostial stenosis. Ramus was small which was patent. Left circumflex was  large which was patent. OM-1 to OM-3 were very small which were patent. RCA has 10-15% proximal stenosis. Aortography showed diffuse enlargement of  aorta from ascending aorta to descending thoracic aorta and there was focal abdominal aortic aneurysm. Bilateral renal arteries are patent.   Labs noted.  Cr 1.31.  H/H 11.2/35.0.  CXR on 04/26/11 showed: 1. COPD and bronchitic changes.  2. Markedly tortuous aorta.  3. Chronic wedge compression fracture of a mid thoracic vertebra.  CTA of the chest on 04/08/10 showed: No evidence pulmonary embolism.  Aneurysmal dilatation of the ascending thoracic aorta, minimally larger than on previous study. Minimal atelectasis in both lungs. (Aneurysmal dilatation of ascending thoracic aorta, 4.9 x 4.7 cm, previously measured 4.7 x 4.4 cm.)  She was evaluated by CT surgeon Dr. Cornelius Moras at that time who disagreed with the Radiologist's measurements and felt the aneurysm was >4.5 but well below 4.9 cm and overall with  only slight growth.  He felt there was no indication for surgical repair at that time.    I reviewed her TAA history with Anesthesiologist Dr. Jean Rosenthal, who agrees that since it has not been studied in ~ 2 years, it would be best to have this re-evaluated preoperatively.  I updated Dr. Hosie Poisson front office staff and was deferred to her PCP.  I spoke with Dr. Willey Blade who plans to arrange a follow-up study of her TAA next week.  Addendum: 03/08/12 1245  Patient had an abdominal ultrasound done on 03/06/12 that did not show any evidence of a AAA.  Her thoracic aneurysm was not studied.  I spoke again with Dr. Jean Rosenthal and Dr. August Saucer.  He will arrange appropriate testing to evaluate her TAA.  Addendum: 03/12/12 1430  Patient did undergo a CTA of the Chest on 03/09/12 that showed no significant interval change in ectasia of the ascending thoracic aorta which measured 4.2 cm in diameter.  There were also numerous small pulmonary nodules (some new, some unchanged) with CT follow-up recommended in 3-12 months depending on associated risks (defer to Dr. August Saucer).  There were also some coronary and aortic valve calcifications that were also noted on her CTA in June 2011.  Reviewed findings with Anesthesiologist Dr. Chaney Malling who agrees patient should be okay to proceed if she remains asymptomatic from a CV standpoint since her TAA and EKG appear stable after recent evaluation.   Shonna Chock, PA-C

## 2012-03-02 ENCOUNTER — Other Ambulatory Visit: Payer: Self-pay | Admitting: Internal Medicine

## 2012-03-02 DIAGNOSIS — I712 Thoracic aortic aneurysm, without rupture: Secondary | ICD-10-CM

## 2012-03-02 NOTE — Progress Notes (Signed)
MRS Hennigan WAS CALLED AT HOME .Marland KitchenMarland Kitchen [phone].Marland KitchenADVISED THAT SURGERY WAS RESCHEDULED TO MAY 8,2013 AND NEW ARRIVAL TIME  IS 0630 AM THAT  MORNING .Marland KitchenMarland Kitchen

## 2012-03-06 ENCOUNTER — Ambulatory Visit (HOSPITAL_COMMUNITY)
Admission: RE | Admit: 2012-03-06 | Discharge: 2012-03-06 | Disposition: A | Discharge status: Home / Self Care | Payer: Medicare Other | Source: Ambulatory Visit | Attending: Internal Medicine | Admitting: Internal Medicine

## 2012-03-06 ENCOUNTER — Other Ambulatory Visit: Payer: Self-pay | Admitting: Internal Medicine

## 2012-03-06 DIAGNOSIS — H268 Other specified cataract: Secondary | ICD-10-CM | POA: Insufficient documentation

## 2012-03-06 DIAGNOSIS — I10 Essential (primary) hypertension: Secondary | ICD-10-CM | POA: Insufficient documentation

## 2012-03-06 DIAGNOSIS — R109 Unspecified abdominal pain: Secondary | ICD-10-CM | POA: Insufficient documentation

## 2012-03-06 DIAGNOSIS — Z79899 Other long term (current) drug therapy: Secondary | ICD-10-CM | POA: Insufficient documentation

## 2012-03-06 DIAGNOSIS — K219 Gastro-esophageal reflux disease without esophagitis: Secondary | ICD-10-CM | POA: Insufficient documentation

## 2012-03-06 DIAGNOSIS — I712 Thoracic aortic aneurysm, without rupture, unspecified: Secondary | ICD-10-CM | POA: Insufficient documentation

## 2012-03-06 DIAGNOSIS — K5730 Diverticulosis of large intestine without perforation or abscess without bleeding: Secondary | ICD-10-CM | POA: Insufficient documentation

## 2012-03-06 DIAGNOSIS — Z01818 Encounter for other preprocedural examination: Secondary | ICD-10-CM | POA: Insufficient documentation

## 2012-03-06 DIAGNOSIS — I77811 Abdominal aortic ectasia: Secondary | ICD-10-CM | POA: Insufficient documentation

## 2012-03-06 DIAGNOSIS — Z01812 Encounter for preprocedural laboratory examination: Secondary | ICD-10-CM | POA: Insufficient documentation

## 2012-03-06 DIAGNOSIS — I700 Atherosclerosis of aorta: Secondary | ICD-10-CM | POA: Insufficient documentation

## 2012-03-06 DIAGNOSIS — G4730 Sleep apnea, unspecified: Secondary | ICD-10-CM | POA: Insufficient documentation

## 2012-03-06 DIAGNOSIS — Z01810 Encounter for preprocedural cardiovascular examination: Secondary | ICD-10-CM | POA: Insufficient documentation

## 2012-03-08 ENCOUNTER — Other Ambulatory Visit: Payer: Self-pay | Admitting: Internal Medicine

## 2012-03-08 DIAGNOSIS — I712 Thoracic aortic aneurysm, without rupture: Secondary | ICD-10-CM

## 2012-03-09 ENCOUNTER — Encounter: Payer: Self-pay | Admitting: Vascular Surgery

## 2012-03-09 ENCOUNTER — Ambulatory Visit
Admission: RE | Admit: 2012-03-09 | Discharge: 2012-03-09 | Disposition: A | Discharge status: Home / Self Care | Payer: Medicare Other | Source: Ambulatory Visit | Attending: Internal Medicine | Admitting: Internal Medicine

## 2012-03-09 DIAGNOSIS — I712 Thoracic aortic aneurysm, without rupture: Secondary | ICD-10-CM

## 2012-03-09 MED ORDER — IOHEXOL 300 MG/ML  SOLN
125.0000 mL | Freq: Once | INTRAMUSCULAR | Status: AC | PRN
  Administered 2012-03-09: 125 mL via INTRAVENOUS

## 2012-03-09 NOTE — Progress Notes (Signed)
Called Mrs. Caetano ... She had ct chest 03/08/12  Report in epic... abd [place]  In epic cancelled.

## 2012-03-13 ENCOUNTER — Other Ambulatory Visit: Payer: Self-pay | Admitting: Ophthalmology

## 2012-03-14 ENCOUNTER — Ambulatory Visit (HOSPITAL_COMMUNITY)
Admission: RE | Admit: 2012-03-14 | Discharge: 2012-03-14 | Disposition: A | Discharge status: Home / Self Care | Payer: Medicare Other | Source: Ambulatory Visit | Attending: Ophthalmology | Admitting: Ophthalmology

## 2012-03-14 ENCOUNTER — Encounter (HOSPITAL_COMMUNITY): Payer: Self-pay | Admitting: Vascular Surgery

## 2012-03-14 ENCOUNTER — Ambulatory Visit (HOSPITAL_COMMUNITY): Payer: Medicare Other | Admitting: Vascular Surgery

## 2012-03-14 ENCOUNTER — Encounter (HOSPITAL_COMMUNITY)
Admission: RE | Disposition: A | Discharge status: Home / Self Care | Payer: Self-pay | Source: Ambulatory Visit | Attending: Ophthalmology

## 2012-03-14 DIAGNOSIS — H2510 Age-related nuclear cataract, unspecified eye: Secondary | ICD-10-CM

## 2012-03-14 DIAGNOSIS — I10 Essential (primary) hypertension: Secondary | ICD-10-CM | POA: Insufficient documentation

## 2012-03-14 DIAGNOSIS — H269 Unspecified cataract: Secondary | ICD-10-CM | POA: Insufficient documentation

## 2012-03-14 HISTORY — PX: CATARACT EXTRACTION W/PHACO: SHX586

## 2012-03-14 SURGERY — PHACOEMULSIFICATION, CATARACT, WITH IOL INSERTION
Anesthesia: Monitor Anesthesia Care | Site: Eye | Laterality: Left | Wound class: Clean

## 2012-03-14 MED ORDER — ACETYLCHOLINE CHLORIDE 1:100 IO SOLR
INTRAOCULAR | Status: DC | PRN
  Administered 2012-03-14: 20 mg via INTRAOCULAR

## 2012-03-14 MED ORDER — LIDOCAINE HCL 2 % IJ SOLN
INTRAMUSCULAR | Status: DC | PRN
  Administered 2012-03-14: 10 mL

## 2012-03-14 MED ORDER — CYCLOPENTOLATE-PHENYLEPHRINE 0.2-1 % OP SOLN
OPHTHALMIC | Status: AC
  Filled 2012-03-14: qty 2

## 2012-03-14 MED ORDER — PHENYLEPHRINE HCL 2.5 % OP SOLN
1.0000 [drp] | OPHTHALMIC | Status: AC
  Administered 2012-03-14 (×2): 1 [drp] via OPHTHALMIC
  Administered 2012-03-14: 12:00:00 via OPHTHALMIC

## 2012-03-14 MED ORDER — BUPIVACAINE HCL 0.75 % IJ SOLN
INTRAMUSCULAR | Status: DC | PRN
  Administered 2012-03-14: 10 mL

## 2012-03-14 MED ORDER — SODIUM CHLORIDE 0.9 % IV SOLN
INTRAVENOUS | Status: DC | PRN
  Administered 2012-03-14: 13:00:00 via INTRAVENOUS

## 2012-03-14 MED ORDER — STERILE WATER FOR IRRIGATION IR SOLN
Status: DC | PRN
  Administered 2012-03-14: 500 mL

## 2012-03-14 MED ORDER — TOBRAMYCIN-DEXAMETHASONE 0.3-0.1 % OP SUSP
OPHTHALMIC | Status: DC | PRN
  Administered 2012-03-14: 1 [drp] via OPHTHALMIC

## 2012-03-14 MED ORDER — TROPICAMIDE 1 % OP SOLN
OPHTHALMIC | Status: AC
  Filled 2012-03-14: qty 3

## 2012-03-14 MED ORDER — LIDOCAINE HCL (CARDIAC) 20 MG/ML IV SOLN
INTRAVENOUS | Status: DC | PRN
  Administered 2012-03-14: 30 mg via INTRAVENOUS

## 2012-03-14 MED ORDER — KETOROLAC TROMETHAMINE 0.5 % OP SOLN
OPHTHALMIC | Status: AC
  Filled 2012-03-14: qty 5

## 2012-03-14 MED ORDER — KETOROLAC TROMETHAMINE 0.5 % OP SOLN
1.0000 [drp] | OPHTHALMIC | Status: AC
  Administered 2012-03-14: 1 [drp] via OPHTHALMIC
  Administered 2012-03-14: 12:00:00 via OPHTHALMIC
  Administered 2012-03-14: 1 [drp] via OPHTHALMIC

## 2012-03-14 MED ORDER — HYALURONIDASE HUMAN 150 UNIT/ML IJ SOLN
INTRAMUSCULAR | Status: DC | PRN
  Administered 2012-03-14: 150 [IU]

## 2012-03-14 MED ORDER — TROPICAMIDE 1 % OP SOLN
1.0000 [drp] | OPHTHALMIC | Status: AC | PRN
  Administered 2012-03-14: 1 [drp] via OPHTHALMIC
  Administered 2012-03-14: 12:00:00 via OPHTHALMIC
  Administered 2012-03-14: 1 [drp] via OPHTHALMIC

## 2012-03-14 MED ORDER — FENTANYL CITRATE 0.05 MG/ML IJ SOLN: 25.0000 ug | INTRAMUSCULAR | Status: DC | PRN

## 2012-03-14 MED ORDER — BSS IO SOLN
INTRAOCULAR | Status: DC | PRN
  Administered 2012-03-14: 15 mL via INTRAOCULAR

## 2012-03-14 MED ORDER — FENTANYL CITRATE 0.05 MG/ML IJ SOLN
INTRAMUSCULAR | Status: DC | PRN
  Administered 2012-03-14 (×2): 25 ug via INTRAVENOUS

## 2012-03-14 MED ORDER — BSS IO SOLN
INTRAOCULAR | Status: DC | PRN
  Administered 2012-03-14: 500 mL via INTRAOCULAR

## 2012-03-14 MED ORDER — CYCLOPENTOLATE HCL 1 % OP SOLN
1.0000 [drp] | OPHTHALMIC | Status: AC | PRN
  Administered 2012-03-14 (×2): 1 [drp] via OPHTHALMIC
  Administered 2012-03-14: 12:00:00 via OPHTHALMIC

## 2012-03-14 MED ORDER — PROPOFOL 10 MG/ML IV EMUL
INTRAVENOUS | Status: DC | PRN
  Administered 2012-03-14: 30 mg via INTRAVENOUS
  Administered 2012-03-14: 10 mg via INTRAVENOUS

## 2012-03-14 MED ORDER — PHENYLEPHRINE HCL 2.5 % OP SOLN
OPHTHALMIC | Status: AC
  Filled 2012-03-14: qty 3

## 2012-03-14 MED ORDER — SODIUM CHLORIDE 0.9 % IR SOLN
Status: DC | PRN
  Administered 2012-03-14: 200 mL

## 2012-03-14 MED ORDER — NA CHONDROIT SULF-NA HYALURON 40-30 MG/ML IO SOLN
INTRAOCULAR | Status: DC | PRN
  Administered 2012-03-14 (×2): 0.5 mL via INTRAOCULAR

## 2012-03-14 MED ORDER — CYCLOPENTOLATE HCL 1 % OP SOLN
OPHTHALMIC | Status: AC
  Filled 2012-03-14: qty 2

## 2012-03-14 MED ORDER — SODIUM CHLORIDE 0.9 % IV SOLN: INTRAVENOUS | Status: DC

## 2012-03-14 MED ORDER — PROVISC 10 MG/ML IO SOLN
INTRAOCULAR | Status: DC | PRN
  Administered 2012-03-14: .85 mL via INTRAOCULAR

## 2012-03-14 MED ORDER — EPINEPHRINE HCL 1 MG/ML IJ SOLN
INTRAMUSCULAR | Status: DC | PRN
  Administered 2012-03-14: .3 mg

## 2012-03-14 SURGICAL SUPPLY — 41 items
APPLICATOR COTTON TIP 6IN STRL (MISCELLANEOUS) ×2 IMPLANT
APPLICATOR DR MATTHEWS STRL (MISCELLANEOUS) IMPLANT
BLADE KERATOME 2.75 (BLADE) ×2 IMPLANT
BLADE MINI RND TIP GREEN BEAV (BLADE) IMPLANT
BLADE STAB KNIFE 45DEG (BLADE) IMPLANT
CANNULA ANTERIOR CHAMBER 27GA (MISCELLANEOUS) ×2 IMPLANT
CLOTH BEACON ORANGE TIMEOUT ST (SAFETY) ×2 IMPLANT
CORDS BIPOLAR (ELECTRODE) IMPLANT
DRAPE OPHTHALMIC 40X48 W POUCH (DRAPES) ×2 IMPLANT
DRAPE RETRACTOR (MISCELLANEOUS) ×2 IMPLANT
FILTER BLUE MILLIPORE (MISCELLANEOUS) IMPLANT
GLOVE BIO SURGEON STRL SZ8 (GLOVE) ×2 IMPLANT
GLOVE ECLIPSE 6.5 STRL STRAW (GLOVE) ×6 IMPLANT
GLOVE SURG SS PI 6.5 STRL IVOR (GLOVE) ×2 IMPLANT
GOWN STRL NON-REIN LRG LVL3 (GOWN DISPOSABLE) ×8 IMPLANT
KIT ROOM TURNOVER OR (KITS) ×2 IMPLANT
KNIFE CRESCENT 2.5 55 ANG (BLADE) ×2 IMPLANT
LENS IOL ACRSF IQ PC 22.5 (Intraocular Lens) ×1 IMPLANT
LENS IOL ACRYSOF IQ POST 22.5 (Intraocular Lens) ×2 IMPLANT
MARKER SKIN DUAL TIP RULER LAB (MISCELLANEOUS) ×2 IMPLANT
MASK EYE SHIELD (GAUZE/BANDAGES/DRESSINGS) ×2 IMPLANT
NEEDLE 25GX 5/8IN NON SAFETY (NEEDLE) ×4 IMPLANT
NEEDLE FILTER BLUNT 18X 1/2SAF (NEEDLE) ×1
NEEDLE FILTER BLUNT 18X1 1/2 (NEEDLE) ×1 IMPLANT
NS IRRIG 1000ML POUR BTL (IV SOLUTION) ×2 IMPLANT
PACK CATARACT CUSTOM (CUSTOM PROCEDURE TRAY) ×2 IMPLANT
PACK CATARACT MCHSCP (PACKS) ×2 IMPLANT
PAD ARMBOARD 7.5X6 YLW CONV (MISCELLANEOUS) ×4 IMPLANT
PAD EYE OVAL STERILE LF (GAUZE/BANDAGES/DRESSINGS) ×2 IMPLANT
PROBE ANTERIOR 20G W/INFUS NDL (MISCELLANEOUS) IMPLANT
SPEAR EYE SURG WECK-CEL (MISCELLANEOUS) IMPLANT
SUT ETHILON 10 0 CS140 6 (SUTURE) ×2 IMPLANT
SUT SILK 4 0 C 3 735G (SUTURE) IMPLANT
SUT SILK 6 0 G 6 (SUTURE) IMPLANT
SUT VICRYL 8 0 TG140 8 (SUTURE) IMPLANT
SYR 3ML LL SCALE MARK (SYRINGE) IMPLANT
TAPE SURG TRANSPORE 1 IN (GAUZE/BANDAGES/DRESSINGS) ×1 IMPLANT
TAPE SURGICAL TRANSPORE 1 IN (GAUZE/BANDAGES/DRESSINGS) ×1
TOWEL OR 17X24 6PK STRL BLUE (TOWEL DISPOSABLE) ×4 IMPLANT
WATER STERILE IRR 1000ML POUR (IV SOLUTION) ×2 IMPLANT
WIPE INSTRUMENT VISIWIPE 73X73 (MISCELLANEOUS) ×2 IMPLANT

## 2012-03-14 NOTE — H&P (Signed)
  History and physical Patient complains of blurry vision left eye difficulty driving and sitting it might  History of present illness patient had previous cataract surgery right eye 2 years ago  Review of systems is negative except as noted positive for sinus problems patient heart: R. confirm tablet pressure aortic ectasia  Active problems 1 headache left eye #2 retinal drusen 3 but just attachment 4 cataract left eye  Physical examination blood pressure 122/78 pulse 72 respirations 20 Gen. no acute distress HEENT visual acuity best corrected right eye 20/25 left eye 20/40  Anterior chamber deep and quiet each eye Lens OD posterior chamber intraocular lens, OS +3 nuclear sclerotic cataract Vitreous posterior vitreous attachment Intraocular pressure: 14 and 12 mm mercury right and left respectively Optic discs 35-40% cupping of the optic nerves both eyes with sharp this margin Maculo-with multiple drusen both eyes Retina and vessels normal Neck old full range of motion Lungs clear to auscultation and percussion Heart: Regular rate with split S1 Abdomen soft nontender Genitourinary deferred Only joints normal colon  Neuro cranial nerves II through XII intact Skin normal  Assessment: Is significant cataract left eye into 0 patient's lifestyle Retinal drusen Vitreous attachment Headache Plan: Phacoemulsification with intraocular lens implant left eye the wrist and benefits of the procedure have been reviewed with the patient she agrees to proceed

## 2012-03-14 NOTE — Anesthesia Preprocedure Evaluation (Addendum)
Anesthesia Evaluation  Patient identified by MRN, date of birth, ID band Patient awake    Reviewed: Allergy & Precautions, H&P , NPO status   Airway Mallampati: II TM Distance: <3 FB Neck ROM: Full    Dental  (+) Edentulous Upper and Dental Advisory Given   Pulmonary sleep apnea ,  breath sounds clear to auscultation        Cardiovascular hypertension, Pt. on medications Rhythm:Regular Rate:Normal     Neuro/Psych  Headaches,    GI/Hepatic Neg liver ROS, GERD-  Controlled,  Endo/Other  negative endocrine ROS  Renal/GU negative Renal ROS     Musculoskeletal  (+) Arthritis -,   Abdominal   Peds  Hematology negative hematology ROS (+)   Anesthesia Other Findings   Reproductive/Obstetrics                          Anesthesia Physical Anesthesia Plan  ASA: III  Anesthesia Plan: MAC   Post-op Pain Management:    Induction: Intravenous  Airway Management Planned: Mask  Additional Equipment:   Intra-op Plan:   Post-operative Plan:   Informed Consent: I have reviewed the patients History and Physical, chart, labs and discussed the procedure including the risks, benefits and alternatives for the proposed anesthesia with the patient or authorized representative who has indicated his/her understanding and acceptance.     Plan Discussed with: CRNA  Anesthesia Plan Comments:        Anesthesia Quick Evaluation

## 2012-03-14 NOTE — Transfer of Care (Signed)
Immediate Anesthesia Transfer of Care Note  Patient: Alexandria Walton  Procedure(s) Performed: Procedure(s) (LRB): CATARACT EXTRACTION PHACO AND INTRAOCULAR LENS PLACEMENT (IOC) (Left)  Patient Location: PACU  Anesthesia Type: MAC  Level of Consciousness: awake, alert  and oriented  Airway & Oxygen Therapy: Patient Spontanous Breathing  Post-op Assessment: Report given to PACU RN  Post vital signs: Reviewed and stable  Complications: No apparent anesthesia complications

## 2012-03-14 NOTE — Op Note (Signed)
Preoperative diagnoses is a significant cataract left eye Postoperative diagnosis: Same Procedure: Phacoemulsification with intraocular lens implant left eye Complications: None Anesthesia: 2% Xylocaine in a 50-50 mixture of 0.75% Marcaine with ampule of Wydase Procedure: The patient was taken to the operating room where a peribulbar block was given with the aforementioned local anesthetic agent. The patient's face was then prepped and draped in the usual sterile fashion. Following this with a surgeon sitting temporally a Weck-Cel sponge was used to fixate the globe and a 15 blade was used to enter through clear cornea Viscoat was injected into the eye. Following this a Weck-Cel was used to fixate the globe and a 2.75 mm keratome blade was used in a stepwise fashion through temporal clear cornea to into the eye. A bent 25-gauge needle was used to incise the anterior capsule and a continuous tear curvilinear capsulorrhexis was formed, BSS was used to hydrodissect and hydrodelineate the nucleus additional Viscoat was injected in the eye following this the phacoemulsification unit was then used to sculpt the nucleus the epinucleus was removed the nucleus was snapped into 4 quadrants all nuclear fragments were removed from the eye the posterior capsule remained intact. At this point the irrigation aspiration device was then used to strip cortical fibers from the posterior capsule the posterior capsule remained intact. Provisc was injected into the eye the intraocular lens implant which was an Alcon AcrySof IQ SN 60 WF 22.5 diopter lens SN #[id number] was injected into the capsular bag and positioned with a Kuglen hook following this the eye a was used to remove viscoelastic from the eye. At this point the eye was pressurized a single 10-0 nylon suture was placed to secure the incision Miostat was injected into the eye. There being no leakage all instrumentation were removed from the eye topical TobraDex  ointment was applied to the eye a patch and Fox shield were placed and the patient returned to recovery area in stable condition Complications none

## 2012-03-14 NOTE — Preoperative (Signed)
Beta Blockers   Reason not to administer Beta Blockers:Not Applicable, last dose 03/14/12 at 07:30

## 2012-03-15 NOTE — Anesthesia Postprocedure Evaluation (Signed)
  Anesthesia Post-op Note  Patient: Alexandria Walton  Procedure(s) Performed: Procedure(s) (LRB): CATARACT EXTRACTION PHACO AND INTRAOCULAR LENS PLACEMENT (IOC) (Left)  Patient Location: PACU  Anesthesia Type: General  Level of Consciousness: awake  Airway and Oxygen Therapy: Patient Spontanous Breathing  Post-op Pain: mild  Post-op Assessment: Post-op Vital signs reviewed  Post-op Vital Signs: Reviewed  Complications: No apparent anesthesia complications

## 2012-03-16 ENCOUNTER — Encounter (HOSPITAL_COMMUNITY): Payer: Self-pay | Admitting: Ophthalmology

## 2012-03-16 ENCOUNTER — Other Ambulatory Visit: Payer: Self-pay | Admitting: Ophthalmology

## 2012-10-25 ENCOUNTER — Emergency Department (HOSPITAL_COMMUNITY): Payer: Medicare Other

## 2012-10-25 ENCOUNTER — Encounter (HOSPITAL_COMMUNITY): Payer: Self-pay | Admitting: *Deleted

## 2012-10-25 ENCOUNTER — Emergency Department (HOSPITAL_COMMUNITY)
Admission: EM | Admit: 2012-10-25 | Discharge: 2012-10-25 | Disposition: A | Discharge status: Home / Self Care | Payer: Medicare Other | Attending: Emergency Medicine | Admitting: Emergency Medicine

## 2012-10-25 DIAGNOSIS — N182 Chronic kidney disease, stage 2 (mild): Secondary | ICD-10-CM | POA: Insufficient documentation

## 2012-10-25 DIAGNOSIS — Z86711 Personal history of pulmonary embolism: Secondary | ICD-10-CM | POA: Insufficient documentation

## 2012-10-25 DIAGNOSIS — R51 Headache: Secondary | ICD-10-CM

## 2012-10-25 DIAGNOSIS — I129 Hypertensive chronic kidney disease with stage 1 through stage 4 chronic kidney disease, or unspecified chronic kidney disease: Secondary | ICD-10-CM | POA: Insufficient documentation

## 2012-10-25 DIAGNOSIS — R112 Nausea with vomiting, unspecified: Secondary | ICD-10-CM | POA: Insufficient documentation

## 2012-10-25 DIAGNOSIS — M47812 Spondylosis without myelopathy or radiculopathy, cervical region: Secondary | ICD-10-CM

## 2012-10-25 DIAGNOSIS — G4733 Obstructive sleep apnea (adult) (pediatric): Secondary | ICD-10-CM | POA: Insufficient documentation

## 2012-10-25 DIAGNOSIS — M1990 Unspecified osteoarthritis, unspecified site: Secondary | ICD-10-CM | POA: Insufficient documentation

## 2012-10-25 DIAGNOSIS — Z8709 Personal history of other diseases of the respiratory system: Secondary | ICD-10-CM | POA: Insufficient documentation

## 2012-10-25 DIAGNOSIS — Z8679 Personal history of other diseases of the circulatory system: Secondary | ICD-10-CM | POA: Insufficient documentation

## 2012-10-25 DIAGNOSIS — Z79899 Other long term (current) drug therapy: Secondary | ICD-10-CM | POA: Insufficient documentation

## 2012-10-25 DIAGNOSIS — E7800 Pure hypercholesterolemia, unspecified: Secondary | ICD-10-CM | POA: Insufficient documentation

## 2012-10-25 DIAGNOSIS — Z7982 Long term (current) use of aspirin: Secondary | ICD-10-CM | POA: Insufficient documentation

## 2012-10-25 DIAGNOSIS — I10 Essential (primary) hypertension: Secondary | ICD-10-CM

## 2012-10-25 LAB — URINALYSIS, ROUTINE W REFLEX MICROSCOPIC
Glucose, UA: 100 mg/dL — AB
Leukocytes, UA: NEGATIVE
Protein, ur: NEGATIVE mg/dL

## 2012-10-25 LAB — BASIC METABOLIC PANEL
BUN: 13 mg/dL (ref 6–23)
Calcium: 9.9 mg/dL (ref 8.4–10.5)
Creatinine, Ser: 0.97 mg/dL (ref 0.50–1.10)
GFR calc non Af Amer: 53 mL/min — ABNORMAL LOW (ref >90)
Glucose, Bld: 101 mg/dL — ABNORMAL HIGH (ref 70–99)

## 2012-10-25 LAB — CBC WITH DIFFERENTIAL/PLATELET
Eosinophils Absolute: 0.1 10*3/uL (ref 0.0–0.7)
Eosinophils Relative: 2 % (ref 0–5)
HCT: 34 % — ABNORMAL LOW (ref 36.0–46.0)
Hemoglobin: 11 g/dL — ABNORMAL LOW (ref 12.0–15.0)
Lymphs Abs: 0.6 10*3/uL — ABNORMAL LOW (ref 0.7–4.0)
MCH: 29.7 pg (ref 26.0–34.0)
MCV: 91.9 fL (ref 78.0–100.0)
Monocytes Absolute: 0.2 10*3/uL (ref 0.1–1.0)
Monocytes Relative: 2 % — ABNORMAL LOW (ref 3–12)
Platelets: 168 10*3/uL (ref 150–400)
RBC: 3.7 MIL/uL — ABNORMAL LOW (ref 3.87–5.11)

## 2012-10-25 MED ORDER — PREDNISONE 20 MG PO TABS: 20.0000 mg | ORAL_TABLET | Freq: Two times a day (BID) | ORAL | Status: DC

## 2012-10-25 MED ORDER — HYDROCODONE-ACETAMINOPHEN 5-325 MG PO TABS: 1.0000 | ORAL_TABLET | Freq: Four times a day (QID) | ORAL | Status: DC | PRN

## 2012-10-25 MED ORDER — TRAMADOL HCL 50 MG PO TABS
50.0000 mg | ORAL_TABLET | Freq: Once | ORAL | Status: AC
  Administered 2012-10-25: 50 mg via ORAL
  Filled 2012-10-25: qty 1

## 2012-10-25 NOTE — ED Notes (Signed)
Pt sts headache x 2 yrs, but increasing x 2 days.  Sts n/v x 2 days and diarrhea last night only. Denies abdominal pain.  Sts she has started seeing a Neurologist and he prescribed her 2 medications, but she hasnt taken them.  Pain score 10/10.  Sts neurologist did not give her a reason for the headaches.

## 2012-10-25 NOTE — ED Provider Notes (Signed)
History     CSN: [id number]  Arrival date & time 10/25/12  1032   First MD Initiated Contact with Patient 10/25/12 1128      Chief Complaint  Patient presents with  . Headache  . Hypertension  . Nausea  . Emesis    (Consider location/radiation/quality/duration/timing/severity/associated sxs/prior treatment) HPI Comments: Alexandria Walton is a 78 y.o. Female who is here for evaluation of headache, and hypertension. She has had a chronically for 2 years, occurring daily, while asleep, and typically lasting until 11 AM. She usually takes ibuprofen with partial relief of the headache. This morning she saw her doctor, who was concerned, because her blood pressure, was elevated at 220/110. At that point, he sent her here for evaluation. She denies nausea, vomiting, weakness, or dizziness. She took ibuprofen this morning, without relief. She's taking her other medicines, as prescribed. She has been referred to ENT and neurology in the past to evaluate her headaches. No clear etiology was found. She was prescribed medication by neurology, but decided not to take it because she was concerned that it could worsen her kidney function. She has not had fever, or blurred vision, sinus symptoms, or cough. She does have intermittent, ongoing, neck pain, for months.  The history is provided by the patient.    Past Medical History  Diagnosis Date  . Allergic rhinitis due to pollen   . Abdominal pain, unspecified site   . Essential hypertension, malignant   . Acute bronchitis   . Osteoarthrosis, unspecified whether generalized or localized, unspecified site   . Abdominal aneurysm without mention of rupture   . Pure hypercholesterolemia   . Chronic kidney disease, stage II (mild)   . GERD (gastroesophageal reflux disease)   . Headache   . PE (pulmonary embolism)     2005  . OSA (obstructive sleep apnea)     mod OSA '07    Past Surgical History  Procedure Date  . Vascular surgery 95? 97?    AAA   . Eye surgery 2010    cat ext right  . Cataract extraction w/phaco 03/14/2012    Procedure: CATARACT EXTRACTION PHACO AND INTRAOCULAR LENS PLACEMENT (IOC);  Surgeon: Chalmers Guest, MD;  Location: [facility] OR;  Service: Ophthalmology;  Laterality: Left;    Family History  Problem Relation Age of Onset  . Anesthesia problems Neg Hx     History  Substance Use Topics  . Smoking status: Not on file  . Smokeless tobacco: Not on file  . Alcohol Use: No    OB History    Grav Para Term Preterm Abortions TAB SAB Ect Mult Living                  Review of Systems  All other systems reviewed and are negative.    Allergies  Codeine and Morphine and related  Home Medications   Current Outpatient Rx  Name  Route  Sig  Dispense  Refill  . ASPIRIN EC 81 MG PO TBEC   Oral   Take 81 mg by mouth at bedtime.         Marland Kitchen METOPROLOL TARTRATE 50 MG PO TABS   Oral   Take 25 mg by mouth 2 (two) times daily.         . CENTRUM SILVER PO   Oral   Take 1 tablet by mouth daily.         Marland Kitchen SIMVASTATIN 40 MG PO TABS   Oral   Take 40  mg by mouth at bedtime.           . DONEPEZIL HCL 10 MG PO TABS   Oral   Take 10 mg by mouth at bedtime.         Marland Kitchen HYDROCODONE-ACETAMINOPHEN 5-325 MG PO TABS   Oral   Take 1 tablet by mouth every 6 (six) hours as needed for pain.   30 tablet   0   . PREDNISONE 20 MG PO TABS   Oral   Take 1 tablet (20 mg total) by mouth 2 (two) times daily.   10 tablet   0     BP 180/93  Pulse 85  Temp 98.6 F (37 C) (Oral)  Resp 19  SpO2 99%  Physical Exam  Nursing note and vitals reviewed. Constitutional: She is oriented to person, place, and time. She appears well-developed and well-nourished.  HENT:  Head: Normocephalic and atraumatic.  Eyes: Conjunctivae normal and EOM are normal. Pupils are equal, round, and reactive to light.  Neck: Normal range of motion and phonation normal. Neck supple. No tracheal deviation present.  Cardiovascular: Normal  rate, regular rhythm and intact distal pulses.   Pulmonary/Chest: Effort normal and breath sounds normal. She exhibits no tenderness.  Abdominal: Soft. She exhibits no distension. There is no tenderness. There is no guarding.  Musculoskeletal:       Neck is lordotic. She has moderate upper cervical tenderness in the midline. There is no thoracic or lumbar spine tenderness. No paravertebral muscular tenderness.  Neurological: She is alert and oriented to person, place, and time. She has normal strength. No cranial nerve deficit. She exhibits normal muscle tone.  Skin: Skin is warm and dry.  Psychiatric: Her behavior is normal. Judgment and thought content normal.       She appears, depressed.    ED Course  Procedures (including critical care time)  ED treatment, Ultram.  Serial blood pressures are reassuring   Reevaluation: 15:10- her daughter, is now here. The patient was previously prescribed, valproic acid, and indomethacin by the neurologist. She did not take either. Patient reports no improvement with the Ultram. She has no other complaints.    Date: 08/24/2012  Rate: 77  Rhythm: normal sinus rhythm  QRS Axis: normal  PR and QT Intervals: normal  ST/T Wave abnormalities: normal  PR and QRS Conduction Disutrbances:Early precordial R. to S. transition  Narrative Interpretation:   Old EKG Reviewed: unchanged   Labs Reviewed  URINALYSIS, ROUTINE W REFLEX MICROSCOPIC - Abnormal; Notable for the following:    Glucose, UA 100 (*)     Hgb urine dipstick TRACE (*)     All other components within normal limits  CBC WITH DIFFERENTIAL - Abnormal; Notable for the following:    RBC 3.70 (*)     Hemoglobin 11.0 (*)     HCT 34.0 (*)     Neutrophils Relative 86 (*)     Lymphocytes Relative 10 (*)     Lymphs Abs 0.6 (*)     Monocytes Relative 2 (*)     All other components within normal limits  BASIC METABOLIC PANEL - Abnormal; Notable for the following:    Sodium 133 (*)      Glucose, Bld 101 (*)     GFR calc non Af Amer 53 (*)     GFR calc Af Amer 62 (*)     All other components within normal limits  URINE MICROSCOPIC-ADD ON  URINE CULTURE   Dg Chest 2  View  10/25/2012  *RADIOLOGY REPORT*  Clinical Data: Hypertension  CHEST - 2 VIEW  Comparison: 03/09/2012  Findings: The thoracic aorta is somewhat tortuous but stable from previous examination.  The cardiac shadow is unremarkable.  The lungs are clear bilaterally.  No acute bony abnormality is seen.  IMPRESSION: No acute abnormality noted.   Original Report Authenticated By: Alcide Clever, M.D.    Ct Head Wo Contrast  10/25/2012  *RADIOLOGY REPORT*  Clinical Data:  Headache, neck pain, nausea and vomiting.  CT HEAD WITHOUT CONTRAST CT CERVICAL SPINE WITHOUT CONTRAST  Technique:  Multidetector CT imaging of the head and cervical spine was performed following the standard protocol without intravenous contrast.  Multiplanar CT image reconstructions of the cervical spine were also generated.  Comparison:  MRI brain 09/26/2011.  CT HEAD  Findings: Stable extensive periventricular white matter disease and remote lacunar type basal ganglia infarcts.  The ventricles are in the midline without mass effect or shift and they are normal in size and configuration.  No extra-axial fluid collections are identified.  No findings for acute hemispheric infarction and/or intracranial hemorrhage.  No mass lesions.  The brainstem and cerebellum grossly normal and stable.  White matter changes are noted in the pons.  The bony structures are intact.  No acute fracture or bone lesion. The paranasal sinuses and mastoid air cells are clear.  IMPRESSION:  1.  No acute intracranial findings. 2.  Stable advanced periventricular white matter disease and remote lacunar infarcts.  CT CERVICAL SPINE  Findings: The sagittal reformatted images demonstrate advanced degenerative cervical spondylosis with multilevel disc disease and facet disease.  No acute  fracture or abnormal prevertebral soft tissue swelling.  Significant facet disease bilaterally with multilevel foraminal stenosis.  There are markedly tortuous ectatic vertebral arteries with impressions on the C4 vertebral body.  The lung apices are clear.  The skull base C1 and C1-2 articulations are maintained.  IMPRESSION:  1.  Normal alignment and no acute bony findings. 2.  Advanced degenerative cervical spondylosis with multilevel disc disease and facet disease.   Original Report Authenticated By: Rudie Meyer, M.D.    Ct Cervical Spine Wo Contrast  10/25/2012  *RADIOLOGY REPORT*  Clinical Data:  Headache, neck pain, nausea and vomiting.  CT HEAD WITHOUT CONTRAST CT CERVICAL SPINE WITHOUT CONTRAST  Technique:  Multidetector CT imaging of the head and cervical spine was performed following the standard protocol without intravenous contrast.  Multiplanar CT image reconstructions of the cervical spine were also generated.  Comparison:  MRI brain 09/26/2011.  CT HEAD  Findings: Stable extensive periventricular white matter disease and remote lacunar type basal ganglia infarcts.  The ventricles are in the midline without mass effect or shift and they are normal in size and configuration.  No extra-axial fluid collections are identified.  No findings for acute hemispheric infarction and/or intracranial hemorrhage.  No mass lesions.  The brainstem and cerebellum grossly normal and stable.  White matter changes are noted in the pons.  The bony structures are intact.  No acute fracture or bone lesion. The paranasal sinuses and mastoid air cells are clear.  IMPRESSION:  1.  No acute intracranial findings. 2.  Stable advanced periventricular white matter disease and remote lacunar infarcts.  CT CERVICAL SPINE  Findings: The sagittal reformatted images demonstrate advanced degenerative cervical spondylosis with multilevel disc disease and facet disease.  No acute fracture or abnormal prevertebral soft tissue  swelling.  Significant facet disease bilaterally with multilevel foraminal stenosis.  There are  markedly tortuous ectatic vertebral arteries with impressions on the C4 vertebral body.  The lung apices are clear.  The skull base C1 and C1-2 articulations are maintained.  IMPRESSION:  1.  Normal alignment and no acute bony findings. 2.  Advanced degenerative cervical spondylosis with multilevel disc disease and facet disease.   Original Report Authenticated By: Rudie Meyer, M.D.    Nursing notes, applicable records and vitals reviewed.  Radiologic Images/Reports reviewed.   1. Headache   2. Hypertension   3. Degenerative joint disease of cervical spine       MDM  Mild, hypertension, and headache. The headache is likely secondary to the marked degenerative disc/joint disease of the neck appear. Doubt CVA, meningitis, complicated, hypertension syndrome. Doubt metabolic instability, serious bacterial infection or impending vascular collapse; the patient is stable for discharge.    Plan: Home Medications- Prednisone, Norco; Home Treatments- heat; Recommended follow up- PCP and Ortho 1-2 weeks.      Flint Melter, MD 10/25/12 [phone]

## 2012-10-25 NOTE — ED Notes (Signed)
Pt's family reports pt c/o h/a and n/v.  She reports that pt has had a constant h/a x 2 years, went to see Dr. August Saucer PMD today and was instructed to come to the ED. Pt is A&Ox 4.

## 2012-10-26 LAB — URINE CULTURE: Colony Count: 8000

## 2012-11-27 LAB — BASIC METABOLIC PANEL
BUN: 22 mg/dL — AB (ref 4–21)
Creatinine: 1.2 mg/dL — AB (ref 0.5–1.1)
Glucose: 77 mg/dL
Potassium: 4.4 mmol/L (ref 3.4–5.3)
Sodium: 142 mmol/L (ref 137–147)

## 2012-11-27 LAB — HEPATIC FUNCTION PANEL: Alkaline Phosphatase: 43 U/L (ref 25–125)

## 2012-12-01 ENCOUNTER — Emergency Department (HOSPITAL_COMMUNITY)
Admission: EM | Admit: 2012-12-01 | Discharge: 2012-12-01 | Disposition: A | Discharge status: Home / Self Care | Payer: Medicare Other | Attending: Emergency Medicine | Admitting: Emergency Medicine

## 2012-12-01 DIAGNOSIS — Z8669 Personal history of other diseases of the nervous system and sense organs: Secondary | ICD-10-CM | POA: Insufficient documentation

## 2012-12-01 DIAGNOSIS — I129 Hypertensive chronic kidney disease with stage 1 through stage 4 chronic kidney disease, or unspecified chronic kidney disease: Secondary | ICD-10-CM | POA: Insufficient documentation

## 2012-12-01 DIAGNOSIS — N182 Chronic kidney disease, stage 2 (mild): Secondary | ICD-10-CM | POA: Insufficient documentation

## 2012-12-01 DIAGNOSIS — R110 Nausea: Secondary | ICD-10-CM | POA: Insufficient documentation

## 2012-12-01 DIAGNOSIS — Z8739 Personal history of other diseases of the musculoskeletal system and connective tissue: Secondary | ICD-10-CM | POA: Insufficient documentation

## 2012-12-01 DIAGNOSIS — Z86711 Personal history of pulmonary embolism: Secondary | ICD-10-CM | POA: Insufficient documentation

## 2012-12-01 DIAGNOSIS — Z79899 Other long term (current) drug therapy: Secondary | ICD-10-CM | POA: Insufficient documentation

## 2012-12-01 DIAGNOSIS — Z7982 Long term (current) use of aspirin: Secondary | ICD-10-CM | POA: Insufficient documentation

## 2012-12-01 DIAGNOSIS — Z8679 Personal history of other diseases of the circulatory system: Secondary | ICD-10-CM | POA: Insufficient documentation

## 2012-12-01 DIAGNOSIS — E7800 Pure hypercholesterolemia, unspecified: Secondary | ICD-10-CM | POA: Insufficient documentation

## 2012-12-01 DIAGNOSIS — R51 Headache: Secondary | ICD-10-CM

## 2012-12-01 DIAGNOSIS — Z8709 Personal history of other diseases of the respiratory system: Secondary | ICD-10-CM | POA: Insufficient documentation

## 2012-12-01 DIAGNOSIS — Z8719 Personal history of other diseases of the digestive system: Secondary | ICD-10-CM | POA: Insufficient documentation

## 2012-12-01 MED ORDER — ONDANSETRON 4 MG PO TBDP
4.0000 mg | ORAL_TABLET | Freq: Once | ORAL | Status: AC
  Administered 2012-12-01: 4 mg via ORAL
  Filled 2012-12-01: qty 1

## 2012-12-01 MED ORDER — HYDROCODONE-ACETAMINOPHEN 5-325 MG PO TABS
1.0000 | ORAL_TABLET | Freq: Once | ORAL | Status: AC
  Administered 2012-12-01: 1 via ORAL
  Filled 2012-12-01: qty 1

## 2012-12-01 MED ORDER — HYDROCODONE-ACETAMINOPHEN 5-325 MG PO TABS: 1.0000 | ORAL_TABLET | ORAL | Status: DC | PRN

## 2012-12-01 NOTE — ED Provider Notes (Signed)
Alexandria Walton is a 79 y.o. female who complains of headache. Headache started today. Headache has been recurrent. I saw her with similar problem 6 weeks ago. I felt her headache was related to her degenerative disease in her neck. Since then. She has seen an orthopedist, who felt that her neck was likely not the source of her pain. She has seen her neurologist to schedule her for a sleep study. Her neurologist is also recommended. Several nonnecrotic treatments for headache. The patient has been reluctant to take these because she thinks it will make her blood pressure worse. Today, she thought she might have a stroke, which was really what initiated her visit, here today.  Exam, she is alert, cooperative since states she is better after treatment in the ED. She has been treated with Zofran, and Norco. Neurologic exam nonfocal. She is fair range of motion of the neck. She is alert, cooperative  Assessment: Multifactorial, headache. Doubt CVA, cervical, spinal stenosis, meningitis, or encephalitis.  Plan: Symptomatic treatment. PCP followup, one week   Medical screening examination/treatment/procedure(s) were conducted as a shared visit with non-physician practitioner(s) and myself.  I personally evaluated the patient during the encounter  Flint Melter, MD 12/02/12 2341

## 2012-12-01 NOTE — ED Provider Notes (Signed)
History     CSN: [id number]  Arrival date & time 12/01/12  1839   First MD Initiated Contact with Patient 12/01/12 2012      Chief Complaint  Patient presents with  . Headache    (Consider location/radiation/quality/duration/timing/severity/associated sxs/prior treatment) HPI  Pt to the ER with complaitns of headache that started today. She has been having recurrent headaches over the past few months. She was seen 6 weeks ago by an EDP here at [facility] and worked up extensively for the headache with no significant findings aside from degenerative joint disease in her neck. She has seen since an orthopedist and neurologist who are working to manage her pain and find cause. Pts blood pressure elevated in triage but she took her Metorpolol prior to my arrival into the room. She is concerned that she may be having stroke but no focal or generalized weakness or deficits present. nad vss       Past Medical History  Diagnosis Date  . Allergic rhinitis due to pollen   . Abdominal pain, unspecified site   . Essential hypertension, malignant   . Acute bronchitis   . Osteoarthrosis, unspecified whether generalized or localized, unspecified site   . Abdominal aneurysm without mention of rupture   . Pure hypercholesterolemia   . Chronic kidney disease, stage II (mild)   . GERD (gastroesophageal reflux disease)   . Headache   . PE (pulmonary embolism)     2005  . OSA (obstructive sleep apnea)     mod OSA '07    Past Surgical History  Procedure Date  . Vascular surgery 95? 97?    AAA  . Eye surgery 2010    cat ext right  . Cataract extraction w/phaco 03/14/2012    Procedure: CATARACT EXTRACTION PHACO AND INTRAOCULAR LENS PLACEMENT (IOC);  Surgeon: Chalmers Guest, MD;  Location: [facility] OR;  Service: Ophthalmology;  Laterality: Left;    Family History  Problem Relation Age of Onset  . Anesthesia problems Neg Hx     History  Substance Use Topics  . Smoking status: Not on file  .  Smokeless tobacco: Not on file  . Alcohol Use: No    OB History    Grav Para Term Preterm Abortions TAB SAB Ect Mult Living                  Review of Systems  Gastrointestinal: Positive for nausea.  Neurological: Positive for headaches.  All other systems reviewed and are negative.    Allergies  Codeine and Morphine and related  Home Medications   Current Outpatient Rx  Name  Route  Sig  Dispense  Refill  . AMLODIPINE BESYLATE 2.5 MG PO TABS   Oral   Take 2.5 mg by mouth daily.         . ASPIRIN EC 81 MG PO TBEC   Oral   Take 81 mg by mouth at bedtime.         Marland Kitchen GABAPENTIN 100 MG PO CAPS   Oral   Take 100 mg by mouth at bedtime.         Marland Kitchen METOPROLOL SUCCINATE ER 50 MG PO TB24   Oral   Take 50 mg by mouth 2 (two) times daily. Take with or immediately following a meal.         . CENTRUM SILVER PO   Oral   Take 1 tablet by mouth daily.         Marland Kitchen SIMVASTATIN 40 MG  PO TABS   Oral   Take 40 mg by mouth at bedtime.           Marland Kitchen HYDROCODONE-ACETAMINOPHEN 5-325 MG PO TABS   Oral   Take 1 tablet by mouth every 4 (four) hours as needed for pain.   10 tablet   0     BP 151/89  Pulse 79  Temp 98.9 F (37.2 C) (Oral)  Resp 17  SpO2 91%  Physical Exam  Nursing note and vitals reviewed. Constitutional: She is oriented to person, place, and time. She appears well-developed and well-nourished. No distress.  HENT:  Head: Normocephalic and atraumatic.  Eyes: Pupils are equal, round, and reactive to light.  Neck: Normal range of motion. Neck supple.  Cardiovascular: Normal rate and regular rhythm.   Pulmonary/Chest: Effort normal.  Abdominal: Soft.  Neurological: She is alert and oriented to person, place, and time. She has normal strength. No cranial nerve deficit or sensory deficit. She displays a negative Romberg sign.  Skin: Skin is warm and dry.    ED Course  Procedures (including critical care time)  Labs Reviewed - No data to display No  results found.   1. Headache       MDM  Pt given 40mg  Prednisone and 1 Norco.  She has had significant improvement in her headache. Dr. Effie Shy has gone to evaluate the patient and feels that she is safe to go home as well.   Mild, hypertension, and headache. The headache is likely secondary to the marked degenerative disc/joint disease of the neck appear. Doubt CVA, meningitis, complicated, hypertension syndrome. Doubt metabolic instability, serious bacterial infection or impending vascular collapse; the patient is stable for discharge   Rx: norco  Pt has been advised of the symptoms that warrant their return to the ED. Patient has voiced understanding and has agreed to follow-up with the PCP or specialist.       Dorthula Matas, PA 12/01/12 2255

## 2012-12-01 NOTE — ED Notes (Signed)
Pt c/o headache all day. Pt also c/o n/v. Pt states she vomited once in triage and is still nauseous. Pt states her pain is 10/10, but not the worst headache of her life. Pt states she took Motrin a few times today, but it didn't help. Pt a/o x 4. Pt denies photophobia.

## 2012-12-05 NOTE — ED Provider Notes (Signed)
Medical screening examination/treatment/procedure(s) were conducted as a shared visit with non-physician practitioner(s) and myself.  I personally evaluated the patient during the encounter  Flint Melter, MD 12/05/12 1756

## 2012-12-21 ENCOUNTER — Encounter: Payer: Self-pay | Admitting: Neurology

## 2012-12-22 ENCOUNTER — Other Ambulatory Visit: Payer: Self-pay

## 2012-12-25 ENCOUNTER — Encounter: Payer: Self-pay | Admitting: Neurology

## 2012-12-25 DIAGNOSIS — R519 Headache, unspecified: Secondary | ICD-10-CM | POA: Insufficient documentation

## 2012-12-25 DIAGNOSIS — G4730 Sleep apnea, unspecified: Secondary | ICD-10-CM | POA: Insufficient documentation

## 2012-12-25 DIAGNOSIS — R51 Headache: Secondary | ICD-10-CM | POA: Insufficient documentation

## 2013-01-03 ENCOUNTER — Encounter: Payer: Self-pay | Admitting: Hematology

## 2013-01-30 ENCOUNTER — Encounter: Payer: Self-pay | Admitting: Neurology

## 2013-01-30 ENCOUNTER — Ambulatory Visit (INDEPENDENT_AMBULATORY_CARE_PROVIDER_SITE_OTHER): Payer: Medicare Other | Admitting: Neurology

## 2013-01-30 VITALS — BP 144/87 | HR 80 | Ht 66.5 in | Wt 155.0 lb

## 2013-01-30 DIAGNOSIS — R413 Other amnesia: Secondary | ICD-10-CM

## 2013-01-30 DIAGNOSIS — R51 Headache: Secondary | ICD-10-CM

## 2013-01-30 HISTORY — DX: Other amnesia: R41.3

## 2013-01-30 MED ORDER — DONEPEZIL HCL 10 MG PO TABS: 10.0000 mg | ORAL_TABLET | ORAL | Status: DC

## 2013-01-30 NOTE — Progress Notes (Signed)
79 years old right-handed female, referred by her primary care physician Dr. Willey Blade, and also ENT Dr. Pollyann Kennedy for evaluation of headaches  She has past medical history of hypertension, hyperlipidemia, mild memory trouble for a few years, is taking donepezil 10 mg every day,  She denied a previous history of headaches, over the past 2 years, she has been having almost daily headaches, starting from 2:00 in the morning, lasting until she got up from bed at 10:00am, she complains of left occipital area pressure pain, eventually settling down at bilateral frontal retro-orbital region, pressure pain, mild to moderate, she denies visual change, no light noise sensitivity,  She had recent left cataract surgery, good vision, CT sinus was negative , she has tried over-the-counter inflammatory medications without help, she denies jaw claudication, there was no diffuse muscle achy pain  she never tried Depakote, worry about the side effects, was given a prescription of prednisone recently, which helped her mild to moderately, previously she has tried Claritin, Allegra without helping her headaches  Daughter reported she has excessive snoring, daytime sleepiness, Today's ESS score was 23, FSS. is 37. The onset of her headaches correlate with the timing of her Aricept, which did not help her memory much, I referred her to sleep study,  I reviewed the sleep study result with her did not reveal significant sleep apnea, periodic limb movement of sleep, overall oxygen desaturation is not enough to qualify for oxygen therapy, this study does not demonstrate a relationship between the oxygen saturation in the onset of headaches,  UPDATE March 26th 2014, She is no longer has headache anymore, she attributated it to her BP adjustment, norvasc 2.5mg  was added on, along with metoprolol 50mg  bid BP is usually 140/70, she overall feels much better, but complains of mild worsening of her memory, had to stop taking donepezil 10  mg every day, wants to go back on it,  MRI of brain  February 2014 showed remote right temporal, and para insular, left subcortical infarction, chronic small vessel disease, no acute lesions     Physical Exam  Neck: supple no carotid bruits Respiratory: clear to auscultation bilaterally Cardiovascular: regular rate rhythm  Neurologic Exam  Mental Status: pleasant, awake, alert, cooperative to history, talking, and casual conversation.Mini-Mental Status examination is 30 out of 30  Cranial Nerves: CN II-XII pupils were equal round reactive to light.  Fundi were sharp bilaterally.  Extraocular movements were full.  Visual fields were full on confrontational test.  Facial sensation and strength were normal.  Hearing was intact to finger rubbing bilaterally.  Uvula tongue were midline.  Head turning and shoulder shrugging were normal and symmetric.  Tongue protrusion into the cheeks strength were normal.  Motor: Normal tone, bulk, and strength. Sensory: Normal to light touch, pinprick, proprioception, and vibratory sensation. Coordination: Normal finger-to-nose, heel-to-shin.  There was no dysmetria noticed. Gait and Station: Narrow based and steady, .  Romberg sign: Negative Reflexes: Deep tendon reflexes: Biceps: 2/2, Brachioradialis: 2/2, Triceps: 2/2, Pateller: 2/2, Achilles: 1/1.  Plantar responses are flexor.   Assessment and plan  79 yo female with 2 years history of almost daily left side and bifrontal headaches, which has improved with her  blood pressure medication changes,  1. Will add on Donepezil 10 mg every day 2. RTC in 6 months with Eber Jones

## 2013-06-12 ENCOUNTER — Other Ambulatory Visit: Payer: Self-pay

## 2013-08-09 ENCOUNTER — Emergency Department (HOSPITAL_COMMUNITY)
Admission: EM | Admit: 2013-08-09 | Discharge: 2013-08-09 | Disposition: A | Discharge status: Home / Self Care | Payer: Medicare Other | Attending: Emergency Medicine | Admitting: Emergency Medicine

## 2013-08-09 ENCOUNTER — Encounter (HOSPITAL_COMMUNITY): Payer: Self-pay | Admitting: Physical Medicine and Rehabilitation

## 2013-08-09 ENCOUNTER — Emergency Department (HOSPITAL_COMMUNITY): Payer: Medicare Other

## 2013-08-09 DIAGNOSIS — N182 Chronic kidney disease, stage 2 (mild): Secondary | ICD-10-CM | POA: Insufficient documentation

## 2013-08-09 DIAGNOSIS — R51 Headache: Secondary | ICD-10-CM | POA: Insufficient documentation

## 2013-08-09 DIAGNOSIS — Z8709 Personal history of other diseases of the respiratory system: Secondary | ICD-10-CM | POA: Insufficient documentation

## 2013-08-09 DIAGNOSIS — Z87891 Personal history of nicotine dependence: Secondary | ICD-10-CM | POA: Insufficient documentation

## 2013-08-09 DIAGNOSIS — Z7982 Long term (current) use of aspirin: Secondary | ICD-10-CM | POA: Insufficient documentation

## 2013-08-09 DIAGNOSIS — Z8669 Personal history of other diseases of the nervous system and sense organs: Secondary | ICD-10-CM | POA: Insufficient documentation

## 2013-08-09 DIAGNOSIS — R350 Frequency of micturition: Secondary | ICD-10-CM | POA: Insufficient documentation

## 2013-08-09 DIAGNOSIS — R112 Nausea with vomiting, unspecified: Secondary | ICD-10-CM | POA: Insufficient documentation

## 2013-08-09 DIAGNOSIS — Z79899 Other long term (current) drug therapy: Secondary | ICD-10-CM | POA: Insufficient documentation

## 2013-08-09 DIAGNOSIS — Z86711 Personal history of pulmonary embolism: Secondary | ICD-10-CM | POA: Insufficient documentation

## 2013-08-09 DIAGNOSIS — Z885 Allergy status to narcotic agent status: Secondary | ICD-10-CM | POA: Insufficient documentation

## 2013-08-09 DIAGNOSIS — R110 Nausea: Secondary | ICD-10-CM

## 2013-08-09 DIAGNOSIS — E7800 Pure hypercholesterolemia, unspecified: Secondary | ICD-10-CM | POA: Insufficient documentation

## 2013-08-09 DIAGNOSIS — Z8679 Personal history of other diseases of the circulatory system: Secondary | ICD-10-CM | POA: Insufficient documentation

## 2013-08-09 DIAGNOSIS — I2510 Atherosclerotic heart disease of native coronary artery without angina pectoris: Secondary | ICD-10-CM | POA: Insufficient documentation

## 2013-08-09 DIAGNOSIS — Z8739 Personal history of other diseases of the musculoskeletal system and connective tissue: Secondary | ICD-10-CM | POA: Insufficient documentation

## 2013-08-09 DIAGNOSIS — I129 Hypertensive chronic kidney disease with stage 1 through stage 4 chronic kidney disease, or unspecified chronic kidney disease: Secondary | ICD-10-CM | POA: Insufficient documentation

## 2013-08-09 LAB — CBC WITH DIFFERENTIAL/PLATELET
Basophils Absolute: 0 10*3/uL (ref 0.0–0.1)
Eosinophils Absolute: 0 10*3/uL (ref 0.0–0.7)
Eosinophils Relative: 0 % (ref 0–5)
HCT: 35.9 % — ABNORMAL LOW (ref 36.0–46.0)
Lymphocytes Relative: 12 % (ref 12–46)
MCH: 30.5 pg (ref 26.0–34.0)
MCHC: 33.1 g/dL (ref 30.0–36.0)
MCV: 92.1 fL (ref 78.0–100.0)
Monocytes Absolute: 0.3 10*3/uL (ref 0.1–1.0)
RDW: 13.7 % (ref 11.5–15.5)
WBC: 7 10*3/uL (ref 4.0–10.5)

## 2013-08-09 LAB — URINE MICROSCOPIC-ADD ON

## 2013-08-09 LAB — POCT I-STAT TROPONIN I: Troponin i, poc: 0 ng/mL (ref 0.00–0.08)

## 2013-08-09 LAB — BASIC METABOLIC PANEL
CO2: 25 mEq/L (ref 19–32)
Calcium: 9.6 mg/dL (ref 8.4–10.5)
Creatinine, Ser: 1.08 mg/dL (ref 0.50–1.10)
Glucose, Bld: 106 mg/dL — ABNORMAL HIGH (ref 70–99)

## 2013-08-09 LAB — URINALYSIS, ROUTINE W REFLEX MICROSCOPIC
Bilirubin Urine: NEGATIVE
Specific Gravity, Urine: 1.009 (ref 1.005–1.030)
pH: 7.5 (ref 5.0–8.0)

## 2013-08-09 MED ORDER — ONDANSETRON 4 MG PO TBDP: 4.0000 mg | ORAL_TABLET | Freq: Three times a day (TID) | ORAL | Status: DC | PRN

## 2013-08-09 MED ORDER — KETOROLAC TROMETHAMINE 30 MG/ML IJ SOLN
30.0000 mg | Freq: Once | INTRAMUSCULAR | Status: AC
  Administered 2013-08-09: 30 mg via INTRAVENOUS
  Filled 2013-08-09: qty 1

## 2013-08-09 MED ORDER — SODIUM CHLORIDE 0.9 % IV BOLUS (SEPSIS)
500.0000 mL | Freq: Once | INTRAVENOUS | Status: AC
  Administered 2013-08-09: 500 mL via INTRAVENOUS

## 2013-08-09 MED ORDER — ONDANSETRON HCL 4 MG/2ML IJ SOLN
4.0000 mg | Freq: Once | INTRAMUSCULAR | Status: AC
  Administered 2013-08-09: 4 mg via INTRAVENOUS
  Filled 2013-08-09: qty 2

## 2013-08-09 NOTE — ED Notes (Signed)
Pt placed on monitor, continuous pulse oximetry and blood pressure cuff; family at bedside 

## 2013-08-09 NOTE — ED Provider Notes (Signed)
79 year old female who presents with a complaint of a headache. She states this is a chronic problem which has been going on for several months, it does seem to be a little bit worse today and has not resolve spontaneously as most of her headaches do. Normal headaches for her is waking up at 5:00 in the morning with a headache which goes away by 9 or 10:00 after she takes her morning blood pressure medications. She notes that she is normally slightly hypotensive during that time. She denies any visual changes, decreased balance, weakness or numbness of the extremities or facial droop or slurred speech. CT scan of the head is negative, on my exam she has clear heart and lung sounds, normal coordination, normal strength and sensation of all 4 extremities, no cranial nerve deficits. She will be treated with pain medications and discharged home to follow up with her family doctor. She will likely need some medication management at this time she is slightly hypertensive.  Medical screening examination/treatment/procedure(s) were conducted as a shared visit with non-physician practitioner(s) and myself.  I personally evaluated the patient during the encounter.    Vida Roller, MD 08/09/13 [phone]

## 2013-08-09 NOTE — ED Notes (Signed)
Pt states she thinks her meds are causing her to have a headache.  She states she takes certain meds and has a headache up until around 2300 and then the headache goes away.  Pt states the headache comes back the next day.

## 2013-08-09 NOTE — ED Notes (Signed)
Pt presents to department for evaluation of headache and N/V. Ongoing for several days. States she can't keep food/fluids down. Also states severe "throbbing" headache. Denies CP. Respirations unlabored. Skin warm and dry. She is conscious alert and oriented x4. Also states urinary frequency.

## 2013-08-09 NOTE — ED Provider Notes (Signed)
CSN: [id number]     Arrival date & time 08/09/13  4098 History   First MD Initiated Contact with Patient 08/09/13 1004     Chief Complaint  Patient presents with  . Headache  . Emesis   (Consider location/radiation/quality/duration/timing/severity/associated sxs/prior Treatment) The history is provided by the patient and medical records.   Patient presents to the ED for evaluation of headache, nausea, and vomiting. Patient states for the past week she has woken up every morning with a headache but this morning the headache was more intense than normal. Headache described as a throbbing sensation across her forehead and behind her eyes. States headaches always resolve around noon. Patient denies any visual disturbance, numbness, weakness, dizziness, tinnitus, neck pain, or changes in speech. Patient has had persistent nausea and vomiting without abdominal pain or diarrhea.  Has attempted to continue eating and drinking regularly but cannot keep any food or fluids down.  Also notes some urinary frequency but denies any dysuria or hematuria. No flank pain, fevers, sweats, or chills.  No recent medication changes although daughter notes that similar sx occurred earlier this year and after some medication adjustment, sx resolved.    Past Medical History  Diagnosis Date  . Allergic rhinitis due to pollen   . Abdominal pain, unspecified site   . Essential hypertension, malignant   . Acute bronchitis   . Osteoarthrosis, unspecified whether generalized or localized, unspecified site   . Abdominal aneurysm without mention of rupture   . Pure hypercholesterolemia   . Chronic kidney disease, stage II (mild)   . GERD (gastroesophageal reflux disease)   . Headache(784.0)   . PE (pulmonary embolism)     2005  . OSA (obstructive sleep apnea)     mod OSA '07  . High cholesterol   . CAD (coronary artery disease)   . Memory loss 01/30/2013   Past Surgical History  Procedure Laterality Date  . Vascular  surgery  95? 97?    AAA  . Eye surgery  2010    cat ext right  . Cataract extraction w/phaco  03/14/2012    Procedure: CATARACT EXTRACTION PHACO AND INTRAOCULAR LENS PLACEMENT (IOC);  Surgeon: Chalmers Guest, MD;  Location: [facility] OR;  Service: Ophthalmology;  Laterality: Left;   Family History  Problem Relation Age of Onset  . Anesthesia problems Neg Hx    History  Substance Use Topics  . Smoking status: Former Smoker    Quit date: 10/20/1993  . Smokeless tobacco: Not on file  . Alcohol Use: No   OB History   Grav Para Term Preterm Abortions TAB SAB Ect Mult Living                 Review of Systems  Gastrointestinal: Positive for nausea and vomiting.  Neurological: Positive for headaches.  All other systems reviewed and are negative.    Allergies  Codeine and Morphine and related  Home Medications   Current Outpatient Rx  Name  Route  Sig  Dispense  Refill  . amLODipine (NORVASC) 2.5 MG tablet   Oral   Take 2.5 mg by mouth every evening.          Marland Kitchen aspirin EC 81 MG tablet   Oral   Take 81 mg by mouth 1 day or 1 dose.          . donepezil (ARICEPT) 10 MG tablet   Oral   Take 1 tablet (10 mg total) by mouth 1 day or 1 dose.  30 tablet   12   . gabapentin (NEURONTIN) 100 MG capsule   Oral   Take 100 mg by mouth at bedtime.         . metoprolol succinate (TOPROL-XL) 50 MG 24 hr tablet   Oral   Take 50 mg by mouth 2 (two) times daily. Take with or immediately following a meal.         . Multiple Vitamins-Minerals (CENTRUM SILVER PO)   Oral   Take by mouth daily.         . simvastatin (ZOCOR) 40 MG tablet   Oral   Take 40 mg by mouth at bedtime.           BP 186/96  Pulse 104  Temp(Src) 98.8 F (37.1 C) (Oral)  Resp 18  SpO2 96%  Physical Exam  Nursing note and vitals reviewed. Constitutional: She is oriented to person, place, and time. She appears well-developed and well-nourished. No distress.  HENT:  Head: Normocephalic and atraumatic.   Mouth/Throat: Oropharynx is clear and moist.  Eyes: Conjunctivae and EOM are normal. Pupils are equal, round, and reactive to light.  Neck: Normal range of motion and full passive range of motion without pain. Neck supple. No rigidity.  No meningeal signs  Cardiovascular: Normal rate, regular rhythm and normal heart sounds.   Pulmonary/Chest: Effort normal and breath sounds normal. No respiratory distress. She has no wheezes.  Abdominal: Soft. Bowel sounds are normal. There is no tenderness. There is no rigidity and no guarding.  Abdomen soft, no rigidity or guarding; no peritoneal signs  Musculoskeletal: Normal range of motion. She exhibits no edema.  Neurological: She is alert and oriented to person, place, and time. She has normal strength. She displays no tremor. No cranial nerve deficit or sensory deficit. She displays no seizure activity. Gait normal.  CN grossly intact, moves all extremities appropriately without ataxia, no focal neuro deficits or facial droop appreciated; ambulating unassisted with non-ataxic gait  Skin: Skin is warm and dry. She is not diaphoretic.  Psychiatric: She has a normal mood and affect.    ED Course  Procedures (including critical care time)   Date: 08/09/2013  Rate: 70  Rhythm: normal sinus rhythm  QRS Axis: normal  Intervals: normal  ST/T Wave abnormalities: normal  Conduction Disutrbances:none  Narrative Interpretation: LVH, no STEMI  Old EKG Reviewed: unchanged   Labs Review Labs Reviewed  CBC WITH DIFFERENTIAL - Abnormal; Notable for the following:    Hemoglobin 11.9 (*)    HCT 35.9 (*)    Platelets 146 (*)    Neutrophils Relative % 84 (*)    All other components within normal limits  BASIC METABOLIC PANEL - Abnormal; Notable for the following:    Glucose, Bld 106 (*)    GFR calc non Af Amer 46 (*)    GFR calc Af Amer 54 (*)    All other components within normal limits  URINALYSIS, ROUTINE W REFLEX MICROSCOPIC - Abnormal; Notable for  the following:    Glucose, UA 100 (*)    Hgb urine dipstick TRACE (*)    Leukocytes, UA TRACE (*)    All other components within normal limits  URINE MICROSCOPIC-ADD ON  POCT I-STAT TROPONIN I   Imaging Review Ct Head Wo Contrast  08/09/2013   CLINICAL DATA:  Headache, nausea and vomiting for several days, history hypertension, chronic kidney disease, coronary artery disease  EXAM: CT HEAD WITHOUT CONTRAST  TECHNIQUE: Contiguous axial images were obtained from  the base of the skull through the vertex without intravenous contrast.  COMPARISON:  10/25/2012  FINDINGS: Generalized atrophy.  Normal ventricular morphology.  No midline shift or mass effect.  Small vessel chronic ischemic changes of deep cerebral white matter.  Old infarct identified at left external capsule and left basal ganglia.  No intracranial hemorrhage, mass lesion, or acute infarction.  Visualized paranasal sinuses clear.  Chronic right mastoid effusion, slightly increased.  Bones unremarkable.  IMPRESSION: Atrophy with small vessel chronic ischemic changes of deep cerebral white matter.  Old lacunar infarct left basal ganglia and left external capsule.  No acute intracranial abnormalities.  Increased chronic right mastoid effusion.   Electronically Signed   By: Ulyses Southward M.D.   On: 08/09/2013 11:25    MDM   1. Headache   2. Nausea   3. Urinary frequency    EKG NSR, no acute ischemic changes.  Trop negative.  Labs as above, largely WNL.  U/a without signs of infection-- trace blood which is always present when compared with previous.  CT head negative for acute findings.  No nuchal rigidity or fever to suspect meningitis.  Pain meds, IVF bolus, and anti-emetics given, will re-evaluate.  1:05 PM Pt re-evaluated, still has headache but low-grade and tolerable.  Nausea resolved, abdomen remains soft without peritoneal signs.  Pt still without focal neuro deficits.  Pt will be d/c with zofran.  Instructed to FU with PCP as soon  as possible to discuss possible medication adjustments.  Discussed plan with pt, she agreed.  Return precautions advised.  Garlon Hatchet, PA-C 08/09/13 [phone]

## 2013-08-12 NOTE — ED Provider Notes (Signed)
Medical screening examination/treatment/procedure(s) were conducted as a shared visit with non-physician practitioner(s) and myself.  I personally evaluated the patient during the encounter  Please see my separate respective documentation pertaining to this patient encounter   Vida Roller, MD 08/12/13 [phone]

## 2013-09-12 ENCOUNTER — Other Ambulatory Visit: Payer: Self-pay

## 2014-01-03 ENCOUNTER — Other Ambulatory Visit: Payer: Self-pay | Admitting: Neurology

## 2014-01-03 DIAGNOSIS — I6350 Cerebral infarction due to unspecified occlusion or stenosis of unspecified cerebral artery: Secondary | ICD-10-CM

## 2014-01-03 DIAGNOSIS — I639 Cerebral infarction, unspecified: Secondary | ICD-10-CM

## 2014-01-06 ENCOUNTER — Other Ambulatory Visit: Payer: Medicare Other

## 2014-01-09 ENCOUNTER — Ambulatory Visit
Admission: RE | Admit: 2014-01-09 | Discharge: 2014-01-09 | Disposition: A | Discharge status: Home / Self Care | Payer: Medicare Other | Source: Ambulatory Visit | Attending: Neurology | Admitting: Neurology

## 2014-01-09 DIAGNOSIS — I639 Cerebral infarction, unspecified: Secondary | ICD-10-CM

## 2014-01-09 DIAGNOSIS — I6350 Cerebral infarction due to unspecified occlusion or stenosis of unspecified cerebral artery: Secondary | ICD-10-CM

## 2014-05-31 ENCOUNTER — Emergency Department (HOSPITAL_COMMUNITY)
Admission: EM | Admit: 2014-05-31 | Discharge: 2014-06-01 | Disposition: A | Discharge status: Home / Self Care | Payer: Medicare Other | Attending: Emergency Medicine | Admitting: Emergency Medicine

## 2014-05-31 ENCOUNTER — Emergency Department (HOSPITAL_COMMUNITY): Payer: Medicare Other

## 2014-05-31 ENCOUNTER — Encounter (HOSPITAL_COMMUNITY): Payer: Self-pay | Admitting: Emergency Medicine

## 2014-05-31 DIAGNOSIS — N182 Chronic kidney disease, stage 2 (mild): Secondary | ICD-10-CM | POA: Insufficient documentation

## 2014-05-31 DIAGNOSIS — J209 Acute bronchitis, unspecified: Secondary | ICD-10-CM | POA: Diagnosis not present

## 2014-05-31 DIAGNOSIS — Z86711 Personal history of pulmonary embolism: Secondary | ICD-10-CM | POA: Insufficient documentation

## 2014-05-31 DIAGNOSIS — IMO0002 Reserved for concepts with insufficient information to code with codable children: Secondary | ICD-10-CM | POA: Insufficient documentation

## 2014-05-31 DIAGNOSIS — G4733 Obstructive sleep apnea (adult) (pediatric): Secondary | ICD-10-CM | POA: Insufficient documentation

## 2014-05-31 DIAGNOSIS — Y929 Unspecified place or not applicable: Secondary | ICD-10-CM | POA: Insufficient documentation

## 2014-05-31 DIAGNOSIS — M1990 Unspecified osteoarthritis, unspecified site: Secondary | ICD-10-CM | POA: Diagnosis not present

## 2014-05-31 DIAGNOSIS — Y9389 Activity, other specified: Secondary | ICD-10-CM | POA: Insufficient documentation

## 2014-05-31 DIAGNOSIS — Z87891 Personal history of nicotine dependence: Secondary | ICD-10-CM | POA: Insufficient documentation

## 2014-05-31 DIAGNOSIS — M545 Low back pain, unspecified: Secondary | ICD-10-CM

## 2014-05-31 DIAGNOSIS — I1310 Hypertensive heart and chronic kidney disease without heart failure, with stage 1 through stage 4 chronic kidney disease, or unspecified chronic kidney disease: Secondary | ICD-10-CM | POA: Diagnosis not present

## 2014-05-31 DIAGNOSIS — Z79899 Other long term (current) drug therapy: Secondary | ICD-10-CM | POA: Diagnosis not present

## 2014-05-31 DIAGNOSIS — R296 Repeated falls: Secondary | ICD-10-CM | POA: Insufficient documentation

## 2014-05-31 DIAGNOSIS — E7800 Pure hypercholesterolemia, unspecified: Secondary | ICD-10-CM | POA: Insufficient documentation

## 2014-05-31 DIAGNOSIS — I2510 Atherosclerotic heart disease of native coronary artery without angina pectoris: Secondary | ICD-10-CM | POA: Insufficient documentation

## 2014-05-31 DIAGNOSIS — Z7982 Long term (current) use of aspirin: Secondary | ICD-10-CM | POA: Diagnosis not present

## 2014-05-31 MED ORDER — HYDROCODONE-ACETAMINOPHEN 5-325 MG PO TABS: ORAL_TABLET | ORAL | Status: DC

## 2014-05-31 MED ORDER — HYDROCODONE-ACETAMINOPHEN 5-325 MG PO TABS
1.0000 | ORAL_TABLET | Freq: Once | ORAL | Status: AC
Start: 2014-05-31 — End: 2014-05-31
  Administered 2014-05-31: 1 via ORAL
  Filled 2014-05-31: qty 1

## 2014-05-31 MED ORDER — HYDROCODONE-ACETAMINOPHEN 5-325 MG PO TABS: 1.0000 | ORAL_TABLET | ORAL | Status: DC | PRN

## 2014-05-31 NOTE — ED Notes (Signed)
While trying to lift 65lb dog into back seat of her car, dog jumped back and knocked her to the ground. Landed on her bottom. Did not hit head. No LOC. Reports pain in lower back on right and left side.

## 2014-05-31 NOTE — ED Notes (Signed)
Patient transported to X-ray 

## 2014-05-31 NOTE — Discharge Instructions (Signed)
Back Pain, Adult Low back pain is very common. About 1 in 5 people have back pain.The cause of low back pain is rarely dangerous. The pain often gets better over time.About half of people with a sudden onset of back pain feel better in just 2 weeks. About 8 in 10 people feel better by 6 weeks.  CAUSES Some common causes of back pain include:  Strain of the muscles or ligaments supporting the spine.  Wear and tear (degeneration) of the spinal discs.  Arthritis.  Direct injury to the back. DIAGNOSIS Most of the time, the direct cause of low back pain is not known.However, back pain can be treated effectively even when the exact cause of the pain is unknown.Answering your caregiver's questions about your overall health and symptoms is one of the most accurate ways to make sure the cause of your pain is not dangerous. If your caregiver needs more information, he or she may order lab work or imaging tests (X-rays or MRIs).However, even if imaging tests show changes in your back, this usually does not require surgery. HOME CARE INSTRUCTIONS For many people, back pain returns.Since low back pain is rarely dangerous, it is often a condition that people can learn to manageon their own.   Remain active. It is stressful on the back to sit or stand in one place. Do not sit, drive, or stand in one place for more than 30 minutes at a time. Take short walks on level surfaces as soon as pain allows.Try to increase the length of time you walk each day.  Do not stay in bed.Resting more than 1 or 2 days can delay your recovery.  Do not avoid exercise or work.Your body is made to move.It is not dangerous to be active, even though your back may hurt.Your back will likely heal faster if you return to being active before your pain is gone.  Pay attention to your body when you bend and lift. Many people have less discomfortwhen lifting if they bend their knees, keep the load close to their bodies,and  avoid twisting. Often, the most comfortable positions are those that put less stress on your recovering back.  Find a comfortable position to sleep. Use a firm mattress and lie on your side with your knees slightly bent. If you lie on your back, put a pillow under your knees.  Only take over-the-counter or prescription medicines as directed by your caregiver. Over-the-counter medicines to reduce pain and inflammation are often the most helpful.Your caregiver may prescribe muscle relaxant drugs.These medicines help dull your pain so you can more quickly return to your normal activities and healthy exercise.  Put ice on the injured area.  Put ice in a plastic bag.  Place a towel between your skin and the bag.  Leave the ice on for 15-20 minutes, 03-04 times a day for the first 2 to 3 days. After that, ice and heat may be alternated to reduce pain and spasms.  Ask your caregiver about trying back exercises and gentle massage. This may be of some benefit.  Avoid feeling anxious or stressed.Stress increases muscle tension and can worsen back pain.It is important to recognize when you are anxious or stressed and learn ways to manage it.Exercise is a great option. SEEK MEDICAL CARE IF:  You have pain that is not relieved with rest or medicine.  You have pain that does not improve in 1 week.  You have new symptoms.  You are generally not feeling well. SEEK   IMMEDIATE MEDICAL CARE IF:   You have pain that radiates from your back into your legs.  You develop new bowel or bladder control problems.  You have unusual weakness or numbness in your arms or legs.  You develop nausea or vomiting.  You develop abdominal pain.  You feel faint. Document Released: 10/24/2005 Document Revised: 04/24/2012 Document Reviewed: 02/25/2014 Exit[employer] Patient Information 2015 [employer]. This information is not intended to replace advice given to you by your health care provider. Make sure you  discuss any questions you have with your health care provider.  

## 2014-06-01 NOTE — ED Provider Notes (Signed)
CSN: [id number]     Arrival date & time 05/31/14  2008 History   First MD Initiated Contact with Patient 05/31/14 2044     Chief Complaint  Patient presents with  . Fall      HPI  Pt presents after fall onto buttocks wile dealing with  Her dog.  C/o pain along "my waistline".  No pain at tailbone, no upper Lumbar pain.  No LE pain or weakness.    Past Medical History  Diagnosis Date  . Allergic rhinitis due to pollen   . Abdominal pain, unspecified site   . Essential hypertension, malignant   . Acute bronchitis   . Osteoarthrosis, unspecified whether generalized or localized, unspecified site   . Abdominal aneurysm without mention of rupture   . Pure hypercholesterolemia   . Chronic kidney disease, stage II (mild)   . GERD (gastroesophageal reflux disease)   . Headache(784.0)   . PE (pulmonary embolism)     2005  . OSA (obstructive sleep apnea)     mod OSA '07  . High cholesterol   . CAD (coronary artery disease)   . Memory loss 01/30/2013   Past Surgical History  Procedure Laterality Date  . Vascular surgery  95? 97?    AAA  . Eye surgery  2010    cat ext right  . Cataract extraction w/phaco  03/14/2012    Procedure: CATARACT EXTRACTION PHACO AND INTRAOCULAR LENS PLACEMENT (IOC);  Surgeon: Marylynn Pearson, MD;  Location: [place];  Service: Ophthalmology;  Laterality: Left;   Family History  Problem Relation Age of Onset  . Anesthesia problems Neg Hx    History  Substance Use Topics  . Smoking status: Former Smoker    Quit date: 10/20/1993  . Smokeless tobacco: Not on file  . Alcohol Use: No   OB History   Grav Para Term Preterm Abortions TAB SAB Ect Mult Living                 Review of Systems  Constitutional: Negative for fever, chills, diaphoresis, appetite change and fatigue.  HENT: Negative for mouth sores, sore throat and trouble swallowing.   Eyes: Negative for visual disturbance.  Respiratory: Negative for cough, chest tightness, shortness of breath and  wheezing.   Cardiovascular: Negative for chest pain.  Gastrointestinal: Negative for nausea, vomiting, abdominal pain, diarrhea and abdominal distention.  Endocrine: Negative for polydipsia, polyphagia and polyuria.  Genitourinary: Negative for dysuria, frequency and hematuria.  Musculoskeletal: Negative for gait problem.  Skin: Negative for color change, pallor and rash.  Neurological: Negative for dizziness, syncope, light-headedness and headaches.  Hematological: Does not bruise/bleed easily.  Psychiatric/Behavioral: Negative for behavioral problems and confusion.      Allergies  Codeine and Morphine and related  Home Medications   Prior to Admission medications   Medication Sig Start Date End Date Taking? Authorizing Provider  Artificial Tear Ointment (DRY EYES OP) Apply 1 drop to eye 2 (two) times daily.   Yes Historical Provider, MD  aspirin EC 81 MG tablet Take 81 mg by mouth at bedtime.    Yes Historical Provider, MD  benazepril (LOTENSIN) 10 MG tablet Take 10 mg by mouth daily.   Yes Historical Provider, MD  donepezil (ARICEPT) 10 MG tablet Take 10 mg by mouth at bedtime.   Yes Historical Provider, MD  gabapentin (NEURONTIN) 100 MG capsule Take 100 mg by mouth at bedtime.   Yes Historical Provider, MD  metoprolol succinate (TOPROL-XL) 50 MG 24 hr tablet  Take 50 mg by mouth 2 (two) times daily. Take with or immediately following a meal.   Yes Historical Provider, MD  Multiple Vitamins-Minerals (CENTRUM SILVER PO) Take 1 tablet by mouth daily.    Yes Historical Provider, MD  simvastatin (ZOCOR) 40 MG tablet Take 40 mg by mouth at bedtime.    Yes Historical Provider, MD  HYDROcodone-acetaminophen (NORCO/VICODIN) 5-325 MG per tablet Take 1 tablet by mouth every 4 (four) hours as needed. 05/31/14   Tanna Furry, MD  HYDROcodone-acetaminophen (NORCO/VICODIN) 5-325 MG per tablet Take home pack!!!!!!!!!!!!!!!!!!!!!!!!!!!!!!!!!!!!!!!!!!!!!!!!!!!!!!!!!!!!!!!!!!!!!!!!!!!!!!!!! 05/31/14   Tanna Furry, MD   BP 166/91  Pulse 65  Temp(Src) 97.7 F (36.5 C) (Oral)  Resp 22  Ht 5\' 8"  (1.727 m)  Wt 152 lb (68.947 kg)  BMI 23.12 kg/m2  SpO2 97% Physical Exam  Constitutional: She is oriented to person, place, and time. She appears well-developed and well-nourished. No distress.  HENT:  Head: Normocephalic.  Eyes: Conjunctivae are normal. Pupils are equal, round, and reactive to light. No scleral icterus.  Neck: Normal range of motion. Neck supple. No thyromegaly present.  Cardiovascular: Normal rate and regular rhythm.  Exam reveals no gallop and no friction rub.   No murmur heard. Pulmonary/Chest: Effort normal and breath sounds normal. No respiratory distress. She has no wheezes. She has no rales.  Abdominal: Soft. Bowel sounds are normal. She exhibits no distension. There is no tenderness. There is no rebound.  Musculoskeletal: Normal range of motion.       Back:  Neurological: She is alert and oriented to person, place, and time.   Norma symmetric strength to flex/.extend hip and knees, dorsi/plantar flex ankles. Normal symmetric sensation to all distributions to LEs Patellar and achilles reflexes 1-2+. Downgoing Babinski   Skin: Skin is warm and dry. No rash noted.  Psychiatric: She has a normal mood and affect. Her behavior is normal.    ED Course  Procedures (including critical care time) Labs Review Labs Reviewed - No data to display  Imaging Review Dg Lumbar Spine Complete  05/31/2014   CLINICAL DATA:  Fall.  Low back pain.  EXAM: LUMBAR SPINE - COMPLETE 4+ VIEW  COMPARISON:  06/01/2009.  FINDINGS: Bones are markedly demineralized throughout. Superior endplate compression at L2 is stable. Disc mineralization at L3-4, L4-5, L5-S1 is unchanged. Facets appear degenerated bilaterally in the lower lumbar levels. Arcuate lines of the sacrum are preserved. SI joints are unremarkable. Symphysis pubis not well seen on this exam.  IMPRESSION: Marked osteopenia with stable L2  superior endplate compression deformity. No acute fracture on the current study although the marked osteopenia does limit assessment.   Electronically Signed   By: Misty Stanley M.D.   On: 05/31/2014 21:48   Dg Pelvis 1-2 Views  05/31/2014   CLINICAL DATA:  80 year old female with fall and right hip pain.  EXAM: PELVIS - 1-2 VIEW  COMPARISON:  09/15/2009 in 04/2028 2010 radiographs  FINDINGS: There is no evidence fracture, subluxation or dislocation.  Degenerative changes within both hips again noted, right greater than left.  No focal bony lesions are present.  IMPRESSION: No evidence of acute bony abnormality.  Degenerative changes within both hips, right greater than left.   Electronically Signed   By: Hassan Rowan M.D.   On: 05/31/2014 21:43     EKG Interpretation None      MDM   Final diagnoses:  Bilateral low back pain without sciatica    Studies normal.  Pt with improved pain after  pain meds.  Neighbor here with patient.  Plan is DC home, rest, decrease activity, prn meds.    Tanna Furry, MD 06/01/14 1515

## 2014-12-07 ENCOUNTER — Emergency Department (HOSPITAL_COMMUNITY): Payer: Medicare Other

## 2014-12-07 ENCOUNTER — Emergency Department (HOSPITAL_COMMUNITY)
Admission: EM | Admit: 2014-12-07 | Discharge: 2014-12-07 | Disposition: A | Discharge status: Home / Self Care | Payer: Medicare Other | Attending: Emergency Medicine | Admitting: Emergency Medicine

## 2014-12-07 ENCOUNTER — Encounter (HOSPITAL_COMMUNITY): Payer: Self-pay | Admitting: Cardiology

## 2014-12-07 DIAGNOSIS — Z8709 Personal history of other diseases of the respiratory system: Secondary | ICD-10-CM | POA: Diagnosis not present

## 2014-12-07 DIAGNOSIS — W01198A Fall on same level from slipping, tripping and stumbling with subsequent striking against other object, initial encounter: Secondary | ICD-10-CM | POA: Insufficient documentation

## 2014-12-07 DIAGNOSIS — Z8739 Personal history of other diseases of the musculoskeletal system and connective tissue: Secondary | ICD-10-CM | POA: Diagnosis not present

## 2014-12-07 DIAGNOSIS — I2510 Atherosclerotic heart disease of native coronary artery without angina pectoris: Secondary | ICD-10-CM | POA: Insufficient documentation

## 2014-12-07 DIAGNOSIS — Z79899 Other long term (current) drug therapy: Secondary | ICD-10-CM | POA: Insufficient documentation

## 2014-12-07 DIAGNOSIS — Z7982 Long term (current) use of aspirin: Secondary | ICD-10-CM | POA: Insufficient documentation

## 2014-12-07 DIAGNOSIS — I129 Hypertensive chronic kidney disease with stage 1 through stage 4 chronic kidney disease, or unspecified chronic kidney disease: Secondary | ICD-10-CM | POA: Diagnosis not present

## 2014-12-07 DIAGNOSIS — Y9389 Activity, other specified: Secondary | ICD-10-CM | POA: Diagnosis not present

## 2014-12-07 DIAGNOSIS — S2231XA Fracture of one rib, right side, initial encounter for closed fracture: Secondary | ICD-10-CM | POA: Insufficient documentation

## 2014-12-07 DIAGNOSIS — E780 Pure hypercholesterolemia: Secondary | ICD-10-CM | POA: Insufficient documentation

## 2014-12-07 DIAGNOSIS — Z87891 Personal history of nicotine dependence: Secondary | ICD-10-CM | POA: Diagnosis not present

## 2014-12-07 DIAGNOSIS — Y9289 Other specified places as the place of occurrence of the external cause: Secondary | ICD-10-CM | POA: Diagnosis not present

## 2014-12-07 DIAGNOSIS — Z8719 Personal history of other diseases of the digestive system: Secondary | ICD-10-CM | POA: Diagnosis not present

## 2014-12-07 DIAGNOSIS — Z8669 Personal history of other diseases of the nervous system and sense organs: Secondary | ICD-10-CM | POA: Diagnosis not present

## 2014-12-07 DIAGNOSIS — Y998 Other external cause status: Secondary | ICD-10-CM | POA: Insufficient documentation

## 2014-12-07 DIAGNOSIS — Z86711 Personal history of pulmonary embolism: Secondary | ICD-10-CM | POA: Insufficient documentation

## 2014-12-07 DIAGNOSIS — S20219A Contusion of unspecified front wall of thorax, initial encounter: Secondary | ICD-10-CM

## 2014-12-07 DIAGNOSIS — N182 Chronic kidney disease, stage 2 (mild): Secondary | ICD-10-CM | POA: Diagnosis not present

## 2014-12-07 DIAGNOSIS — S29001A Unspecified injury of muscle and tendon of front wall of thorax, initial encounter: Secondary | ICD-10-CM | POA: Diagnosis present

## 2014-12-07 MED ORDER — TRAMADOL HCL 50 MG PO TABS
50.0000 mg | ORAL_TABLET | Freq: Once | ORAL | Status: AC
  Administered 2014-12-07: 50 mg via ORAL
  Filled 2014-12-07: qty 1

## 2014-12-07 MED ORDER — TRAMADOL HCL 50 MG PO TABS: 50.0000 mg | ORAL_TABLET | Freq: Four times a day (QID) | ORAL | Status: DC | PRN

## 2014-12-07 NOTE — ED Notes (Signed)
Pt reports she fell this morning bending over to pick up her dog. Reports a poping sensation in the right flank area. Pt denies hitting her head or any LOC.

## 2014-12-07 NOTE — ED Provider Notes (Signed)
CSN: [id number]     Arrival date & time 12/07/14  1030 History   First MD Initiated Contact with Patient 12/07/14 1051     Chief Complaint  Patient presents with  . Fall     (Consider location/radiation/quality/duration/timing/severity/associated sxs/prior Treatment) Patient is a 81 y.o. female presenting with fall. The history is provided by the patient.  Fall Associated symptoms include chest pain. Pertinent negatives include no abdominal pain, no headaches and no shortness of breath.  pt c/o right lower chest wall pain s/p fall this morning.  Pt states her dog is elderly, she was tried to help it up the steps when she bent to lift it, fell forward and hit right lower chest.  Pt states she 'felt a pop'. Pain dull, moderate, constant, worse w palpation and movement/position changes. Prior to incident, pt felt fine, at baseline. No sob. Pt denies head injury or headache. No neck or back pain. No abd pain. No anticoag use.  Skin intact.     Past Medical History  Diagnosis Date  . Allergic rhinitis due to pollen   . Abdominal pain, unspecified site   . Essential hypertension, malignant   . Acute bronchitis   . Osteoarthrosis, unspecified whether generalized or localized, unspecified site   . Abdominal aneurysm without mention of rupture   . Pure hypercholesterolemia   . Chronic kidney disease, stage II (mild)   . GERD (gastroesophageal reflux disease)   . Headache(784.0)   . PE (pulmonary embolism)     2005  . OSA (obstructive sleep apnea)     mod OSA '07  . High cholesterol   . CAD (coronary artery disease)   . Memory loss 01/30/2013   Past Surgical History  Procedure Laterality Date  . Vascular surgery  95? 97?    AAA  . Eye surgery  2010    cat ext right  . Cataract extraction w/phaco  03/14/2012    Procedure: CATARACT EXTRACTION PHACO AND INTRAOCULAR LENS PLACEMENT (IOC);  Surgeon: Marylynn Pearson, MD;  Location: [facility];  Service: Ophthalmology;  Laterality: Left;   Family  History  Problem Relation Age of Onset  . Anesthesia problems Neg Hx    History  Substance Use Topics  . Smoking status: Former Smoker    Quit date: 10/20/1993  . Smokeless tobacco: Not on file  . Alcohol Use: No   OB History    No data available     Review of Systems  Constitutional: Negative for fever and chills.  HENT: Negative for nosebleeds.   Eyes: Negative for pain.  Respiratory: Negative for cough and shortness of breath.   Cardiovascular: Positive for chest pain.  Gastrointestinal: Negative for vomiting, abdominal pain and diarrhea.  Genitourinary: Negative for flank pain.  Musculoskeletal: Negative for back pain and neck pain.  Skin: Negative for wound.  Neurological: Negative for weakness, numbness and headaches.  Hematological: Does not bruise/bleed easily.  Psychiatric/Behavioral: Negative for confusion.      Allergies  Codeine and Morphine and related  Home Medications   Prior to Admission medications   Medication Sig Start Date End Date Taking? Authorizing Provider  Artificial Tear Ointment (DRY EYES OP) Apply 1 drop to eye 2 (two) times daily.    Historical Provider, MD  aspirin EC 81 MG tablet Take 81 mg by mouth at bedtime.     Historical Provider, MD  benazepril (LOTENSIN) 10 MG tablet Take 10 mg by mouth daily.    Historical Provider, MD  donepezil (ARICEPT) 10  MG tablet Take 10 mg by mouth at bedtime.    Historical Provider, MD  gabapentin (NEURONTIN) 100 MG capsule Take 100 mg by mouth at bedtime.    Historical Provider, MD  HYDROcodone-acetaminophen (NORCO/VICODIN) 5-325 MG per tablet Take 1 tablet by mouth every 4 (four) hours as needed. 05/31/14   Tanna Furry, MD  HYDROcodone-acetaminophen (NORCO/VICODIN) 5-325 MG per tablet Take home pack!!!!!!!!!!!!!!!!!!!!!!!!!!!!!!!!!!!!!!!!!!!!!!!!!!!!!!!!!!!!!!!!!!!!!!!!!!!!!!!!! 05/31/14   Tanna Furry, MD  metoprolol succinate (TOPROL-XL) 50 MG 24 hr tablet Take 50 mg by mouth 2 (two) times daily. Take with or  immediately following a meal.    Historical Provider, MD  Multiple Vitamins-Minerals (CENTRUM SILVER PO) Take 1 tablet by mouth daily.     Historical Provider, MD  simvastatin (ZOCOR) 40 MG tablet Take 40 mg by mouth at bedtime.     Historical Provider, MD   BP 190/82 mmHg  Pulse 72  Temp(Src) 98 F (36.7 C) (Oral)  Resp 18  Wt 152 lb (68.947 kg)  SpO2 96% Physical Exam  Constitutional: She is oriented to person, place, and time. She appears well-developed and well-nourished. No distress.  HENT:  Head: Atraumatic.  Mouth/Throat: Oropharynx is clear and moist.  Eyes: Conjunctivae are normal. No scleral icterus.  Neck: Neck supple. No tracheal deviation present.  Cardiovascular: Normal rate, regular rhythm, normal heart sounds and intact distal pulses.   Pulmonary/Chest: Effort normal and breath sounds normal. No respiratory distress. She exhibits tenderness.  Right lower chest wall tenderness. Normal chest movement. No crepitus.   Abdominal: Soft. Normal appearance and bowel sounds are normal. She exhibits no distension and no mass. There is no tenderness. There is no rebound and no guarding.  No abd wall contusion or tenderness.  Genitourinary:  No cva or flank tenderness.   Musculoskeletal: She exhibits no edema.  CTLS spine, non tender, aligned, no step off. Good rom bil ext without pain or focal bony tenderness.   Neurological: She is alert and oriented to person, place, and time.  Moves bil ext purposefully w good strength. Steady gait.   Skin: Skin is warm and dry. No rash noted. She is not diaphoretic.  Psychiatric: She has a normal mood and affect.  Nursing note and vitals reviewed.   ED Course  Procedures (including critical care time)   Dg Ribs Unilateral W/chest Right  12/07/2014   CLINICAL DATA:  Chest wall contusion and right lower rib pain after falling yesterday. Initial encounter.  EXAM: RIGHT RIBS AND CHEST - 3+ VIEW  COMPARISON:  October 25, 2012.  FINDINGS:  Cortical irregularity is seen involving the lateral portion of her right middle rib suggesting possible nondisplaced fracture. There is no evidence of pneumothorax or pleural effusion. Both lungs are clear. Heart size and mediastinal contours are within normal limits.  IMPRESSION: Possible nondisplaced fracture involving lateral portion of right middle rib. No acute intracranial abnormality seen.   Electronically Signed   By: Sabino Dick M.D.   On: 12/07/2014 12:16      MDM   Xrays.  Ultram po.  Reviewed nursing notes and prior charts for additional history.   Cxr w possible single rib fx.  No increased wob.  Pt comfortable.  Pt currently appears stable for d/c.      Mirna Mires, MD 12/07/14 1221

## 2014-12-07 NOTE — ED Notes (Signed)
Pt and pt's family comfortable with discharge and follow up instructions. Prescriptions x1.

## 2014-12-07 NOTE — Discharge Instructions (Signed)
It was our pleasure to provide your ER care today - we hope that you feel better.  Take motrin or aleve as need for pain.   You may also take ultram as need for pain - no driving when taking ultram.  Follow up with primary care doctor in coming week as planned.  Return to ER if worse, trouble breathing, other concern.     Chest Contusion A chest contusion is a deep bruise on your chest area. Contusions are the result of an injury that caused bleeding under the skin. A chest contusion may involve bruising of the skin, muscles, or ribs. The contusion may turn blue, purple, or yellow. Minor injuries will give you a painless contusion, but more severe contusions may stay painful and swollen for a few weeks. CAUSES  A contusion is usually caused by a blow, trauma, or direct force to an area of the body. SYMPTOMS   Swelling and redness of the injured area.  Discoloration of the injured area.  Tenderness and soreness of the injured area.  Pain. DIAGNOSIS  The diagnosis can be made by taking a history and performing a physical exam. An X-ray, CT scan, or MRI may be needed to determine if there were any associated injuries, such as broken bones (fractures) or internal injuries. TREATMENT  Often, the best treatment for a chest contusion is resting, icing, and applying cold compresses to the injured area. Deep breathing exercises may be recommended to reduce the risk of pneumonia. Over-the-counter medicines may also be recommended for pain control. HOME CARE INSTRUCTIONS   Put ice on the injured area.  Put ice in a plastic bag.  Place a towel between your skin and the bag.  Leave the ice on for 15-20 minutes, 03-04 times a day.  Only take over-the-counter or prescription medicines as directed by your caregiver. Your caregiver may recommend avoiding anti-inflammatory medicines (aspirin, ibuprofen, and naproxen) for 48 hours because these medicines may increase bruising.  Rest the  injured area.  Perform deep-breathing exercises as directed by your caregiver.  Stop smoking if you smoke.  Do not lift objects over 5 pounds (2.3 kg) for 3 days or longer if recommended by your caregiver. SEEK IMMEDIATE MEDICAL CARE IF:   You have increased bruising or swelling.  You have pain that is getting worse.  You have difficulty breathing.  You have dizziness, weakness, or fainting.  You have blood in your urine or stool.  You cough up or vomit blood.  Your swelling or pain is not relieved with medicines. MAKE SURE YOU:   Understand these instructions.  Will watch your condition.  Will get help right away if you are not doing well or get worse. Document Released: 07/19/2001 Document Revised: 07/18/2012 Document Reviewed: 04/16/2012 [facility] Patient Information 2015 [place], [place]. This information is not intended to replace advice given to you by your health care provider. Make sure you discuss any questions you have with your health care provider.     Rib Fracture A rib fracture is a break or crack in one of the bones of the ribs. The ribs are a group of long, curved bones that wrap around your chest and attach to your spine. They protect your lungs and other organs in the chest cavity. A broken or cracked rib is often painful, but most do not cause other problems. Most rib fractures heal on their own over time. However, rib fractures can be more serious if multiple ribs are broken or if broken  ribs move out of place and push against other structures. CAUSES   A direct blow to the chest. For example, this could happen during contact sports, a car accident, or a fall against a hard object.  Repetitive movements with high force, such as pitching a baseball or having severe coughing spells. SYMPTOMS   Pain when you breathe in or cough.  Pain when someone presses on the injured area. DIAGNOSIS  Your caregiver will perform a physical exam. Various imaging tests  may be ordered to confirm the diagnosis and to look for related injuries. These tests may include a chest X-ray, computed tomography (CT), magnetic resonance imaging (MRI), or a bone scan. TREATMENT  Rib fractures usually heal on their own in 1-3 months. The longer healing period is often associated with a continued cough or other aggravating activities. During the healing period, pain control is very important. Medication is usually given to control pain. Hospitalization or surgery may be needed for more severe injuries, such as those in which multiple ribs are broken or the ribs have moved out of place.  HOME CARE INSTRUCTIONS   Avoid strenuous activity and any activities or movements that cause pain. Be careful during activities and avoid bumping the injured rib.  Gradually increase activity as directed by your caregiver.  Only take over-the-counter or prescription medications as directed by your caregiver. Do not take other medications without asking your caregiver first.  Apply ice to the injured area for the first 1-2 days after you have been treated or as directed by your caregiver. Applying ice helps to reduce inflammation and pain.  Put ice in a plastic bag.  Place a towel between your skin and the bag.   Leave the ice on for 15-20 minutes at a time, every 2 hours while you are awake.  Perform deep breathing as directed by your caregiver. This will help prevent pneumonia, which is a common complication of a broken rib. Your caregiver may instruct you to:  Take deep breaths several times a day.  Try to cough several times a day, holding a pillow against the injured area.  Use a device called an incentive spirometer to practice deep breathing several times a day.  Drink enough fluids to keep your urine clear or pale yellow. This will help you avoid constipation.   Do not wear a rib belt or binder. These restrict breathing, which can lead to pneumonia.  SEEK IMMEDIATE MEDICAL  CARE IF:   You have a fever.   You have difficulty breathing or shortness of breath.   You develop a continual cough, or you cough up thick or bloody sputum.  You feel sick to your stomach (nausea), throw up (vomit), or have abdominal pain.   You have worsening pain not controlled with medications.  MAKE SURE YOU:  Understand these instructions.  Will watch your condition.  Will get help right away if you are not doing well or get worse. Document Released: 10/24/2005 Document Revised: 06/26/2013 Document Reviewed: 12/26/2012 [facility] Patient Information 2015 [place], [place]. This information is not intended to replace advice given to you by your health care provider. Make sure you discuss any questions you have with your health care provider.

## 2015-11-10 ENCOUNTER — Ambulatory Visit (HOSPITAL_BASED_OUTPATIENT_CLINIC_OR_DEPARTMENT_OTHER): Payer: Medicare Other | Attending: Internal Medicine

## 2015-11-10 VITALS — Ht 64.0 in | Wt 164.0 lb

## 2015-11-10 DIAGNOSIS — Z79899 Other long term (current) drug therapy: Secondary | ICD-10-CM | POA: Diagnosis not present

## 2015-11-10 DIAGNOSIS — R454 Irritability and anger: Secondary | ICD-10-CM

## 2015-11-10 DIAGNOSIS — R0683 Snoring: Secondary | ICD-10-CM | POA: Diagnosis not present

## 2015-11-10 DIAGNOSIS — I493 Ventricular premature depolarization: Secondary | ICD-10-CM | POA: Insufficient documentation

## 2015-11-10 DIAGNOSIS — G4733 Obstructive sleep apnea (adult) (pediatric): Secondary | ICD-10-CM | POA: Diagnosis not present

## 2015-11-10 DIAGNOSIS — R5383 Other fatigue: Secondary | ICD-10-CM | POA: Insufficient documentation

## 2015-11-15 DIAGNOSIS — R454 Irritability and anger: Secondary | ICD-10-CM | POA: Diagnosis not present

## 2015-11-15 DIAGNOSIS — G4733 Obstructive sleep apnea (adult) (pediatric): Secondary | ICD-10-CM | POA: Diagnosis not present

## 2015-11-15 DIAGNOSIS — R0683 Snoring: Secondary | ICD-10-CM | POA: Diagnosis not present

## 2015-11-15 NOTE — Progress Notes (Signed)
   NAME: Alexandria Walton DATE OF BIRTH:  08-19-1929 MEDICAL RECORD NUMBER [id number]  LOCATION: [facility]  PHYSICIAN: Makaiah Terwilliger D  DATE OF STUDY: 11/10/2015 CLINICAL INFORMATION Sleep Study Type: NPSG Indication for sleep study: Fatigue, Snoring Epworth Sleepiness Score: 3  SLEEP STUDY TECHNIQUE As per the AASM Manual for the Scoring of Sleep and Associated Events v2.3 (April 2016) with a hypopnea requiring 4% desaturations. The channels recorded and monitored were frontal, central and occipital EEG, electrooculogram (EOG), submentalis EMG (chin), nasal and oral airflow, thoracic and abdominal wall motion, anterior tibialis EMG, snore microphone, electrocardiogram, and pulse oximetry.  MEDICATIONS [name] medications include: charted for rev iew Medications self-administered by patient during sleep study : benazepril, simvastatin, metoprolol, donepezil  SLEEP ARCHITECTURE The study was initiated at 10:04:40 PM and ended at 4:31:58 AM. Sleep onset time was 41.4 minutes and the sleep efficiency was 53.6%. The total sleep time was 207.5 minutes. Stage REM latency was 103.5 minutes. The patient spent 7.23% of the night in stage N1 sleep, 79.52% in stage N2 sleep, 1.93% in stage N3 and 11.33% in REM. Alpha intrusion was absent. Supine sleep was 78.07%.  RESPIRATORY PARAMETERS The overall apnea/hypopnea index (AHI) was 5.8 per hour. There were 13 total apneas, including 13 obstructive, 0 central and 0 mixed apneas. There were 7 hypopneas and 7 RERAs. The AHI during Stage REM sleep was 38.3 per hour. AHI while supine was 7.4 per hour. The mean oxygen saturation was 91.05%. The minimum SpO2 during sleep was 81.00%. Moderate snoring was noted during this study.  CARDIAC DATA The 2 lead EKG demonstrated sinus rhythm. The mean heart rate was 63.30 beats per minute. Other EKG findings include: PVCs.  LEG MOVEMENT DATA The total PLMS were 0 with a resulting PLMS  index of 0.00. Associated arousal with leg movement index was 0.0 .  IMPRESSIONS - Mild obstructive sleep apnea occurred during this study (AHI = 5.8/h). - There were not enough early events tyo meet protocol requirements for split CPAP titration. - No significant central sleep apnea occurred during this study (CAI = 0.0/h). - Mild oxygen desaturation was noted during this study (Min O2 = 81.00%). - The patient snored with Moderate snoring volume. - EKG findings include PVCs. - Clinically significant periodic limb movements did not occur during sleep. No significant associated arousals.  DIAGNOSIS - Obstructive Sleep Apnea (327.23 [G47.33 ICD-10])  RECOMMENDATIONS - Positional therapy avoiding supine position during sleep. - Very mild obstructive sleep apnea. Return to discuss treatment options. - Avoid alcohol, sedatives and other CNS depressants that may worsen sleep apnea and disrupt normal sleep architecture. - Sleep hygiene should be reviewed to assess factors that may improve sleep quality. - Weight management and regular exercise should be initiated or continued if appropriate.   Deneise Lever Diplomate, American Board of Sleep Medicine  ELECTRONICALLY SIGNED ON:  11/15/2015, 2:22 PM [place] PH: [phone]   FX: [phone] [place]

## 2015-11-19 ENCOUNTER — Ambulatory Visit
Admission: RE | Admit: 2015-11-19 | Discharge: 2015-11-19 | Disposition: A | Discharge status: Home / Self Care | Payer: Medicare Other | Source: Ambulatory Visit | Attending: Internal Medicine | Admitting: Internal Medicine

## 2015-11-19 ENCOUNTER — Other Ambulatory Visit: Payer: Self-pay | Admitting: Internal Medicine

## 2015-11-19 DIAGNOSIS — R05 Cough: Secondary | ICD-10-CM

## 2015-11-19 DIAGNOSIS — R059 Cough, unspecified: Secondary | ICD-10-CM

## 2015-11-22 ENCOUNTER — Emergency Department (INDEPENDENT_AMBULATORY_CARE_PROVIDER_SITE_OTHER)
Admission: EM | Admit: 2015-11-22 | Discharge: 2015-11-22 | Disposition: A | Discharge status: Home / Self Care | Payer: Medicare Other | Source: Home / Self Care | Attending: Emergency Medicine | Admitting: Emergency Medicine

## 2015-11-22 ENCOUNTER — Encounter (HOSPITAL_COMMUNITY): Payer: Self-pay | Admitting: Emergency Medicine

## 2015-11-22 ENCOUNTER — Emergency Department (INDEPENDENT_AMBULATORY_CARE_PROVIDER_SITE_OTHER): Payer: Medicare Other

## 2015-11-22 DIAGNOSIS — J40 Bronchitis, not specified as acute or chronic: Secondary | ICD-10-CM

## 2015-11-22 MED ORDER — PREDNISONE 50 MG PO TABS: ORAL_TABLET | ORAL | Status: DC

## 2015-11-22 MED ORDER — HYDROCODONE-HOMATROPINE 5-1.5 MG/5ML PO SYRP: 2.5000 mL | ORAL_SOLUTION | Freq: Four times a day (QID) | ORAL | Status: DC | PRN

## 2015-11-22 NOTE — ED Notes (Signed)
C/o persistent dry cough associated w/congsetion, runny nose, chills Just finished antibiotics PCP Rx her on Friday Also reports bruising to left side/ribcage A&O x4... No acute distress.

## 2015-11-22 NOTE — Discharge Instructions (Signed)
You have bronchitis. Take prednisone as prescribed. Use hydoan as needed for cough. You should see improvement in the next 3-5 days. If you develop fevers, difficulty breathing, or are just not getting better, please come back or go to the emergency room.  There is no sign of rib injury on your x-rays. I'm not sure where the bruise came from. Please follow-up with your primary care doctor in the next week.

## 2015-11-22 NOTE — ED Provider Notes (Signed)
CSN: [id number]     Arrival date & time 11/22/15  1341 History   First MD Initiated Contact with Patient 11/22/15 1523     Chief Complaint  Patient presents with  . URI   (Consider location/radiation/quality/duration/timing/severity/associated sxs/prior Treatment) HPI  She is an 82 year old woman here for evaluation of cough. She reports cold symptoms including nasal congestion and cough for the last week. She states the cough is constant and productive of yellow to green phlegm. She denies any wheezing, shortness of breath, or chest pain. She called her primary care doctor last week and he put her on a course of azithromycin which she has completed. He also did a chest x-ray that was negative for pneumonia. Despite taking the antibiotic, she denies any improvement in her symptoms. About 4 days ago, she developed extensive bruising on the left side. She denies any injuries or falls. She denies any pain. She does not take any blood thinners.  Past Medical History  Diagnosis Date  . Allergic rhinitis due to pollen   . Abdominal pain, unspecified site   . Essential hypertension, malignant   . Acute bronchitis   . Osteoarthrosis, unspecified whether generalized or localized, unspecified site   . Abdominal aneurysm without mention of rupture   . Pure hypercholesterolemia   . Chronic kidney disease, stage II (mild)   . GERD (gastroesophageal reflux disease)   . Headache(784.0)   . PE (pulmonary embolism)     2005  . OSA (obstructive sleep apnea)     mod OSA '07  . High cholesterol   . CAD (coronary artery disease)   . Memory loss 01/30/2013   Past Surgical History  Procedure Laterality Date  . Vascular surgery  95? 97?    AAA  . Eye surgery  2010    cat ext right  . Cataract extraction w/phaco  03/14/2012    Procedure: CATARACT EXTRACTION PHACO AND INTRAOCULAR LENS PLACEMENT (IOC);  Surgeon: Marylynn Pearson, MD;  Location: [facility];  Service: Ophthalmology;  Laterality: Left;   Family  History  Problem Relation Age of Onset  . Anesthesia problems Neg Hx    Social History  Substance Use Topics  . Smoking status: Former Smoker    Quit date: 10/20/1993  . Smokeless tobacco: None  . Alcohol Use: No   OB History    No data available     Review of Systems As in history of present illness Allergies  Codeine and Morphine and related  Home Medications   Prior to Admission medications   Medication Sig Start Date End Date Taking? Authorizing Provider  aspirin EC 81 MG tablet Take 81 mg by mouth at bedtime.    Yes Historical Provider, MD  benazepril (LOTENSIN) 10 MG tablet Take 10 mg by mouth daily.   Yes Historical Provider, MD  metoprolol succinate (TOPROL-XL) 50 MG 24 hr tablet Take 50 mg by mouth 2 (two) times daily. Take with or immediately following a meal.   Yes Historical Provider, MD  Multiple Vitamins-Minerals (CENTRUM SILVER PO) Take 1 tablet by mouth daily.    Yes Historical Provider, MD  simvastatin (ZOCOR) 40 MG tablet Take 40 mg by mouth at bedtime.    Yes Historical Provider, MD  Artificial Tear Ointment (DRY EYES OP) Apply 1 drop to eye 2 (two) times daily.    Historical Provider, MD  donepezil (ARICEPT) 10 MG tablet Take 10 mg by mouth at bedtime.    Historical Provider, MD  gabapentin (NEURONTIN) 100 MG capsule  Take 100 mg by mouth at bedtime.    Historical Provider, MD  HYDROcodone-acetaminophen (NORCO/VICODIN) 5-325 MG per tablet Take 1 tablet by mouth every 4 (four) hours as needed. 05/31/14   Tanna Furry, MD  HYDROcodone-acetaminophen (NORCO/VICODIN) 5-325 MG per tablet Take home pack!!!!!!!!!!!!!!!!!!!!!!!!!!!!!!!!!!!!!!!!!!!!!!!!!!!!!!!!!!!!!!!!!!!!!!!!!!!!!!!!! 05/31/14   Tanna Furry, MD  HYDROcodone-homatropine [facility]) 5-1.5 MG/5ML syrup Take 2.5-5 mLs by mouth every 6 (six) hours as needed for cough. 11/22/15   Melony Overly, MD  predniSONE (DELTASONE) 50 MG tablet Take 1 pill daily for 5 days. 11/22/15   Melony Overly, MD  traMADol (ULTRAM) 50 MG  tablet Take 1 tablet (50 mg total) by mouth every 6 (six) hours as needed. 12/07/14   Lajean Saver, MD   Meds Ordered and Administered this Visit  Medications - No data to display  BP 167/100 mmHg  Pulse 93  Temp(Src) 99.8 F (37.7 C) (Oral)  SpO2 96% No data found.   Physical Exam  Constitutional: She is oriented to person, place, and time. She appears well-developed and well-nourished. No distress.  HENT:  Mouth/Throat: No oropharyngeal exudate.  Small amount of clear postnasal drainage seen. Nasal mucosa is normal.  Neck: Neck supple.  Cardiovascular: Normal rate, regular rhythm and normal heart sounds.   No murmur heard. Pulmonary/Chest: Effort normal and breath sounds normal. No respiratory distress. She has no wheezes. She has no rales. She exhibits no tenderness.  She has significant bruising along the left chest wall in the axillary line. There is associated mild swelling. There is no tenderness.  Lymphadenopathy:    She has no cervical adenopathy.  Neurological: She is alert and oriented to person, place, and time.    ED Course  Procedures (including critical care time)  Labs Review Labs Reviewed - No data to display  Imaging Review Dg Ribs Unilateral W/chest Left  11/22/2015  CLINICAL DATA:  Sick for 1 week, cough and congestion, bruising, history malignant essential hypertension, pulmonary embolism, coronary artery disease, former smoker EXAM: LEFT RIBS AND CHEST - 3+ VIEW COMPARISON:  11/19/2015 FINDINGS: Enlargement of cardiac silhouette. Calcified tortuous thoracic aorta with question enlargement of the aortic arch. Mediastinal contours and pulmonary vascularity otherwise normal. Lungs emphysematous but clear. No pulmonary infiltrate, pleural effusion or pneumothorax. Bones demineralized. Views of LEFT ribs show old appearing deformities of the LEFT fifth through ninth ribs compatible with old fractures. No definite acute fractures identified. IMPRESSION: COPD changes  without infiltrate. Osseous demineralization with multiple old LEFT rib fractures. No definite acute bony abnormalities. Calcified, tortuous and question aneurysmal thoracic aorta again identified, unchanged. Electronically Signed   By: Lavonia Dana M.D.   On: 11/22/2015 16:09      MDM   1. Bronchitis    X-ray negative for pneumonia or acute fracture. I'm unclear of where the bruising is coming from. She likely has a viral bronchitis given she did not respond to azithromycin. We'll treat with 5 days of prednisone as well as Hycodan as needed for cough. Recommended follow-up with her PCP later this week for recheck. Return precautions reviewed.    Melony Overly, MD 11/22/15 [id number]

## 2016-02-21 ENCOUNTER — Emergency Department (HOSPITAL_COMMUNITY): Payer: Medicare Other

## 2016-02-21 ENCOUNTER — Encounter (HOSPITAL_COMMUNITY): Payer: Self-pay | Admitting: Emergency Medicine

## 2016-02-21 ENCOUNTER — Inpatient Hospital Stay (HOSPITAL_COMMUNITY)
Admission: EM | Admit: 2016-02-21 | Discharge: 2016-02-26 | DRG: 300 | Disposition: A | Discharge status: Skilled Nursing Facility | Payer: Medicare Other | Attending: Internal Medicine | Admitting: Internal Medicine

## 2016-02-21 DIAGNOSIS — K219 Gastro-esophageal reflux disease without esophagitis: Secondary | ICD-10-CM | POA: Diagnosis present

## 2016-02-21 DIAGNOSIS — D649 Anemia, unspecified: Secondary | ICD-10-CM | POA: Diagnosis present

## 2016-02-21 DIAGNOSIS — S82142A Displaced bicondylar fracture of left tibia, initial encounter for closed fracture: Secondary | ICD-10-CM | POA: Diagnosis present

## 2016-02-21 DIAGNOSIS — Z66 Do not resuscitate: Secondary | ICD-10-CM | POA: Diagnosis present

## 2016-02-21 DIAGNOSIS — I71019 Dissection of thoracic aorta, unspecified: Secondary | ICD-10-CM

## 2016-02-21 DIAGNOSIS — D696 Thrombocytopenia, unspecified: Secondary | ICD-10-CM | POA: Diagnosis present

## 2016-02-21 DIAGNOSIS — E7800 Pure hypercholesterolemia, unspecified: Secondary | ICD-10-CM | POA: Diagnosis present

## 2016-02-21 DIAGNOSIS — S82202A Unspecified fracture of shaft of left tibia, initial encounter for closed fracture: Secondary | ICD-10-CM

## 2016-02-21 DIAGNOSIS — N179 Acute kidney failure, unspecified: Secondary | ICD-10-CM | POA: Diagnosis present

## 2016-02-21 DIAGNOSIS — F0390 Unspecified dementia without behavioral disturbance: Secondary | ICD-10-CM | POA: Diagnosis present

## 2016-02-21 DIAGNOSIS — M1990 Unspecified osteoarthritis, unspecified site: Secondary | ICD-10-CM | POA: Diagnosis present

## 2016-02-21 DIAGNOSIS — M549 Dorsalgia, unspecified: Secondary | ICD-10-CM

## 2016-02-21 DIAGNOSIS — I161 Hypertensive emergency: Secondary | ICD-10-CM | POA: Diagnosis not present

## 2016-02-21 DIAGNOSIS — I7101 Dissection of ascending aorta: Secondary | ICD-10-CM | POA: Diagnosis present

## 2016-02-21 DIAGNOSIS — I2510 Atherosclerotic heart disease of native coronary artery without angina pectoris: Secondary | ICD-10-CM | POA: Diagnosis present

## 2016-02-21 DIAGNOSIS — N183 Chronic kidney disease, stage 3 unspecified: Secondary | ICD-10-CM | POA: Diagnosis present

## 2016-02-21 DIAGNOSIS — I10 Essential (primary) hypertension: Secondary | ICD-10-CM | POA: Diagnosis present

## 2016-02-21 DIAGNOSIS — Z8679 Personal history of other diseases of the circulatory system: Secondary | ICD-10-CM

## 2016-02-21 DIAGNOSIS — I129 Hypertensive chronic kidney disease with stage 1 through stage 4 chronic kidney disease, or unspecified chronic kidney disease: Secondary | ICD-10-CM | POA: Diagnosis present

## 2016-02-21 DIAGNOSIS — J90 Pleural effusion, not elsewhere classified: Secondary | ICD-10-CM

## 2016-02-21 DIAGNOSIS — I7100 Dissection of unspecified site of aorta: Secondary | ICD-10-CM | POA: Diagnosis present

## 2016-02-21 DIAGNOSIS — M25562 Pain in left knee: Secondary | ICD-10-CM | POA: Diagnosis not present

## 2016-02-21 DIAGNOSIS — Z86711 Personal history of pulmonary embolism: Secondary | ICD-10-CM

## 2016-02-21 DIAGNOSIS — I160 Hypertensive urgency: Secondary | ICD-10-CM

## 2016-02-21 DIAGNOSIS — G4733 Obstructive sleep apnea (adult) (pediatric): Secondary | ICD-10-CM | POA: Diagnosis present

## 2016-02-21 DIAGNOSIS — Z885 Allergy status to narcotic agent status: Secondary | ICD-10-CM

## 2016-02-21 DIAGNOSIS — Z87891 Personal history of nicotine dependence: Secondary | ICD-10-CM

## 2016-02-21 LAB — CBC
HCT: 36.1 % (ref 36.0–46.0)
HEMOGLOBIN: 11.6 g/dL — AB (ref 12.0–15.0)
MCH: 29.8 pg (ref 26.0–34.0)
MCHC: 32.1 g/dL (ref 30.0–36.0)
MCV: 92.8 fL (ref 78.0–100.0)
PLATELETS: 147 10*3/uL — AB (ref 150–400)
RBC: 3.89 MIL/uL (ref 3.87–5.11)
RDW: 13.3 % (ref 11.5–15.5)
WBC: 8.3 10*3/uL (ref 4.0–10.5)

## 2016-02-21 LAB — BASIC METABOLIC PANEL
ANION GAP: 13 (ref 5–15)
BUN: 16 mg/dL (ref 6–20)
CO2: 23 mmol/L (ref 22–32)
Calcium: 9.8 mg/dL (ref 8.9–10.3)
Chloride: 105 mmol/L (ref 101–111)
Creatinine, Ser: 1.5 mg/dL — ABNORMAL HIGH (ref 0.44–1.00)
GFR calc Af Amer: 36 mL/min — ABNORMAL LOW (ref >60)
GFR, EST NON AFRICAN AMERICAN: 31 mL/min — AB (ref >60)
GLUCOSE: 102 mg/dL — AB (ref 65–99)
POTASSIUM: 4.3 mmol/L (ref 3.5–5.1)
Sodium: 141 mmol/L (ref 135–145)

## 2016-02-21 MED ORDER — METOPROLOL SUCCINATE ER 25 MG PO TB24: 50.0000 mg | ORAL_TABLET | Freq: Two times a day (BID) | ORAL | Status: DC

## 2016-02-21 MED ORDER — MIRABEGRON ER 50 MG PO TB24: 50.0000 mg | ORAL_TABLET | Freq: Every day | ORAL | Status: DC

## 2016-02-21 MED ORDER — DONEPEZIL HCL 10 MG PO TABS
10.0000 mg | ORAL_TABLET | Freq: Every day | ORAL | Status: DC
  Administered 2016-02-22 – 2016-02-25 (×4): 10 mg via ORAL
  Filled 2016-02-21 (×4): qty 1

## 2016-02-21 MED ORDER — HYDROCODONE-ACETAMINOPHEN 5-325 MG PO TABS
1.0000 | ORAL_TABLET | ORAL | Status: DC | PRN
  Administered 2016-02-21: 2 via ORAL
  Filled 2016-02-21: qty 2

## 2016-02-21 MED ORDER — B COMPLEX-C PO TABS
1.0000 | ORAL_TABLET | Freq: Every day | ORAL | Status: DC
  Administered 2016-02-23 – 2016-02-26 (×4): 1 via ORAL
  Filled 2016-02-21 (×4): qty 1

## 2016-02-21 MED ORDER — HYDROMORPHONE HCL 1 MG/ML IJ SOLN
0.5000 mg | INTRAMUSCULAR | Status: DC | PRN
  Administered 2016-02-22: 0.5 mg via INTRAVENOUS
  Filled 2016-02-21: qty 1

## 2016-02-21 MED ORDER — HYPROMELLOSE (GONIOSCOPIC) 2.5 % OP SOLN: 1.0000 [drp] | Freq: Four times a day (QID) | OPHTHALMIC | Status: DC | PRN

## 2016-02-21 MED ORDER — SIMVASTATIN 40 MG PO TABS
40.0000 mg | ORAL_TABLET | Freq: Every day | ORAL | Status: DC
  Administered 2016-02-22 – 2016-02-24 (×3): 40 mg via ORAL
  Filled 2016-02-21 (×4): qty 1

## 2016-02-21 MED ORDER — BENAZEPRIL HCL 20 MG PO TABS: 20.0000 mg | ORAL_TABLET | Freq: Two times a day (BID) | ORAL | Status: DC
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
## 2016-02-21 NOTE — Progress Notes (Signed)
Orthopedic Tech Progress Note Patient Details:  Alexandria Walton [birth year] [id number]  Ortho Devices Type of Ortho Device: Knee Immobilizer Ortho Device/Splint Location: LLE Ortho Device/Splint Interventions: Ordered, Application   Braulio Bosch 02/21/2016, 5:46 PM

## 2016-02-22 ENCOUNTER — Emergency Department (HOSPITAL_COMMUNITY): Payer: Medicare Other

## 2016-02-22 ENCOUNTER — Encounter (HOSPITAL_COMMUNITY): Payer: Self-pay | Admitting: *Deleted

## 2016-02-22 DIAGNOSIS — I169 Hypertensive crisis, unspecified: Secondary | ICD-10-CM | POA: Diagnosis not present

## 2016-02-22 DIAGNOSIS — E7800 Pure hypercholesterolemia, unspecified: Secondary | ICD-10-CM | POA: Diagnosis present

## 2016-02-22 DIAGNOSIS — Z8679 Personal history of other diseases of the circulatory system: Secondary | ICD-10-CM | POA: Diagnosis not present

## 2016-02-22 DIAGNOSIS — I7100 Dissection of unspecified site of aorta: Secondary | ICD-10-CM | POA: Diagnosis present

## 2016-02-22 DIAGNOSIS — Z87891 Personal history of nicotine dependence: Secondary | ICD-10-CM | POA: Diagnosis not present

## 2016-02-22 DIAGNOSIS — S82202A Unspecified fracture of shaft of left tibia, initial encounter for closed fracture: Secondary | ICD-10-CM | POA: Diagnosis not present

## 2016-02-22 DIAGNOSIS — I10 Essential (primary) hypertension: Secondary | ICD-10-CM | POA: Diagnosis not present

## 2016-02-22 DIAGNOSIS — N179 Acute kidney failure, unspecified: Secondary | ICD-10-CM | POA: Diagnosis present

## 2016-02-22 DIAGNOSIS — S82142A Displaced bicondylar fracture of left tibia, initial encounter for closed fracture: Secondary | ICD-10-CM | POA: Diagnosis present

## 2016-02-22 DIAGNOSIS — G4733 Obstructive sleep apnea (adult) (pediatric): Secondary | ICD-10-CM | POA: Diagnosis present

## 2016-02-22 DIAGNOSIS — I71019 Dissection of thoracic aorta, unspecified: Secondary | ICD-10-CM | POA: Diagnosis present

## 2016-02-22 DIAGNOSIS — N183 Chronic kidney disease, stage 3 unspecified: Secondary | ICD-10-CM | POA: Diagnosis present

## 2016-02-22 DIAGNOSIS — M25562 Pain in left knee: Secondary | ICD-10-CM | POA: Diagnosis present

## 2016-02-22 DIAGNOSIS — I7101 Dissection of ascending aorta: Secondary | ICD-10-CM | POA: Diagnosis present

## 2016-02-22 DIAGNOSIS — F0390 Unspecified dementia without behavioral disturbance: Secondary | ICD-10-CM | POA: Diagnosis present

## 2016-02-22 DIAGNOSIS — D696 Thrombocytopenia, unspecified: Secondary | ICD-10-CM | POA: Diagnosis present

## 2016-02-22 DIAGNOSIS — I160 Hypertensive urgency: Secondary | ICD-10-CM | POA: Diagnosis not present

## 2016-02-22 DIAGNOSIS — I129 Hypertensive chronic kidney disease with stage 1 through stage 4 chronic kidney disease, or unspecified chronic kidney disease: Secondary | ICD-10-CM | POA: Diagnosis present

## 2016-02-22 DIAGNOSIS — Z66 Do not resuscitate: Secondary | ICD-10-CM | POA: Diagnosis present

## 2016-02-22 DIAGNOSIS — M1990 Unspecified osteoarthritis, unspecified site: Secondary | ICD-10-CM | POA: Diagnosis present

## 2016-02-22 DIAGNOSIS — D649 Anemia, unspecified: Secondary | ICD-10-CM | POA: Diagnosis present

## 2016-02-22 DIAGNOSIS — I161 Hypertensive emergency: Secondary | ICD-10-CM | POA: Diagnosis not present

## 2016-02-22 DIAGNOSIS — M549 Dorsalgia, unspecified: Secondary | ICD-10-CM

## 2016-02-22 DIAGNOSIS — Z86711 Personal history of pulmonary embolism: Secondary | ICD-10-CM | POA: Diagnosis not present

## 2016-02-22 DIAGNOSIS — I2510 Atherosclerotic heart disease of native coronary artery without angina pectoris: Secondary | ICD-10-CM | POA: Diagnosis present

## 2016-02-22 DIAGNOSIS — K219 Gastro-esophageal reflux disease without esophagitis: Secondary | ICD-10-CM | POA: Diagnosis present

## 2016-02-22 LAB — CBG MONITORING, ED: GLUCOSE-CAPILLARY: 97 mg/dL (ref 65–99)

## 2016-02-22 MED ORDER — BISACODYL 5 MG PO TBEC
5.0000 mg | DELAYED_RELEASE_TABLET | Freq: Every day | ORAL | Status: DC | PRN
Start: 2016-02-22 — End: 2016-02-26

## 2016-02-22 MED ORDER — ONDANSETRON HCL 4 MG PO TABS: 4.0000 mg | ORAL_TABLET | Freq: Four times a day (QID) | ORAL | Status: DC | PRN

## 2016-02-22 MED ORDER — LABETALOL HCL 5 MG/ML IV SOLN
10.0000 mg | Freq: Once | INTRAVENOUS | Status: AC
  Administered 2016-02-22: 10 mg via INTRAVENOUS
  Filled 2016-02-22: qty 4

## 2016-02-22 MED ORDER — SODIUM CHLORIDE 0.9 % IV SOLN
Freq: Once | INTRAVENOUS | Status: AC
  Administered 2016-02-22: 07:00:00 via INTRAVENOUS

## 2016-02-22 MED ORDER — SODIUM CHLORIDE 0.9% FLUSH
3.0000 mL | Freq: Two times a day (BID) | INTRAVENOUS | Status: DC
  Administered 2016-02-22: 3 mL via INTRAVENOUS
  Administered 2016-02-22: 10 mL via INTRAVENOUS
  Administered 2016-02-23 – 2016-02-25 (×5): 3 mL via INTRAVENOUS

## 2016-02-22 MED ORDER — ONDANSETRON HCL 4 MG/2ML IJ SOLN: 4.0000 mg | Freq: Four times a day (QID) | INTRAMUSCULAR | Status: DC | PRN

## 2016-02-22 MED ORDER — GI COCKTAIL ~~LOC~~
30.0000 mL | Freq: Three times a day (TID) | ORAL | Status: DC | PRN
  Filled 2016-02-22: qty 30

## 2016-02-22 MED ORDER — ACETAMINOPHEN 650 MG RE SUPP: 650.0000 mg | Freq: Four times a day (QID) | RECTAL | Status: DC | PRN

## 2016-02-22 MED ORDER — IOPAMIDOL (ISOVUE-370) INJECTION 76%
INTRAVENOUS | Status: AC
Start: 2016-02-22 — End: 2016-02-22
  Administered 2016-02-22: 80 mL
  Filled 2016-02-22: qty 100

## 2016-02-22 MED ORDER — SODIUM CHLORIDE 0.9 % IV BOLUS (SEPSIS)
1000.0000 mL | Freq: Once | INTRAVENOUS | Status: AC
  Administered 2016-02-22: 1000 mL via INTRAVENOUS

## 2016-02-22 MED ORDER — FENTANYL CITRATE (PF) 100 MCG/2ML IJ SOLN
12.5000 ug | INTRAMUSCULAR | Status: DC | PRN
  Administered 2016-02-22 – 2016-02-24 (×8): 50 ug via INTRAVENOUS
  Administered 2016-02-25 – 2016-02-26 (×2): 12.5 ug via INTRAVENOUS
  Filled 2016-02-22 (×10): qty 2

## 2016-02-22 MED ORDER — POLYETHYLENE GLYCOL 3350 17 G PO PACK: 17.0000 g | PACK | Freq: Every day | ORAL | Status: DC | PRN

## 2016-02-22 MED ORDER — TRAZODONE HCL 50 MG PO TABS: 25.0000 mg | ORAL_TABLET | Freq: Every evening | ORAL | Status: DC | PRN

## 2016-02-22 MED ORDER — LABETALOL HCL 5 MG/ML IV SOLN
0.5000 mg/min | INTRAVENOUS | Status: DC
  Administered 2016-02-22 (×2): 0.5 mg/min via INTRAVENOUS
  Filled 2016-02-22 (×2): qty 100

## 2016-02-22 MED ORDER — SODIUM CHLORIDE 0.9 % IV SOLN
INTRAVENOUS | Status: DC
  Administered 2016-02-22 – 2016-02-23 (×4): via INTRAVENOUS

## 2016-02-22 MED ORDER — ACETAMINOPHEN 325 MG PO TABS
650.0000 mg | ORAL_TABLET | Freq: Four times a day (QID) | ORAL | Status: DC | PRN
  Administered 2016-02-25 – 2016-02-26 (×2): 650 mg via ORAL
  Filled 2016-02-22 (×2): qty 2

## 2016-02-22 NOTE — ED Notes (Signed)
Placed pt on bed pan.
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
## 2016-02-22 NOTE — ED Notes (Signed)
Critical Care at the bedside  

## 2016-02-22 NOTE — Consult Note (Signed)
I have reviewed her xrays. She will likely be amenable to non-operative treatment in a knee immobilizer, Non-weightbearing.   I will perform formal eval in clinic as an outpatient or in hospital if she is admitted for mobilization.     Alexandria Walton D

## 2016-02-22 NOTE — ED Notes (Signed)
RN spoke to Dr Venora Maples regarding orders for pt.  He met w/ pt and decided that pt would be NPO and advised RN that additional orders would be placed.  RN updated pt.

## 2016-02-22 NOTE — H&P (Signed)
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  Component Value Date   CREATININE 1.50* 02/21/2016   CREATININE 1.08 08/09/2013   CREATININE 1.2* 11/27/2012  IV fluids   History of PE (2005) No anticoagulants at this time due to potential bleeding risks due to ascending aortic dissection SCDs  DVT prophylaxis: SCD's Code Status:   Full code. This was discussed extensively with the patient and she wants full medical involvement including intubation in the case that resuscitation is needed.  Family Communication:  Discussed with patient. No family at bedside Disposition Plan: Expect patient to be discharged to home with [place] called:  Kathryne Hitch, Ortho; Dr. Geoffry Paradise Admission status: inpatient ICU    [facility] E MD [facility]  If 7PM-7AM, please contact night-coverage [email or web address] Password [facility]  02/22/2016, 12:47 PM
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
## 2016-02-22 NOTE — ED Notes (Signed)
Spoke with patient's daughter Caren Griffins per patient's approval. Updated her on patient's plan of care and condition.
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
## 2016-02-22 NOTE — ED Notes (Signed)
MD Merrell at the bedside  

## 2016-02-22 NOTE — ED Notes (Signed)
CHECKED CBG 97, RN JENNIFER INFORMED

## 2016-02-22 NOTE — ED Notes (Signed)
MD Cardiac Surgeon at the bedside

## 2016-02-22 NOTE — ED Notes (Signed)
Patient transported to X-ray 

## 2016-02-22 NOTE — Discharge Planning (Signed)
ERCM will continue to follow for disposition needs.

## 2016-02-22 NOTE — ED Provider Notes (Addendum)
5:38 AM Patient with a tibial plateau fracture of the left.  She has advanced age, coronary artery disease, hypertension, hx of PE, history of AAA.  She is at least an ASA 3.  She'll benefit from acute hospitalization.  She'll need serial compartment exam evaluation  Preop EKG and chest x-ray ordered.  Patient made nothing by mouth.  Maintenance IV fluids at this time.  Dr. Percell Miller, Orthopedics to evaluate the pt for operative repair  6:31 AM At this time the [facility] team would like for Dr Percell Miller to see the patient and determine her care.  The case was discussed with Dr Loleta Books who also discussed the case with the Hospitalist administrator on call, Dr Gevena Barre. The hospitalist team is available for consultation by Dr Percell Miller if he deems necessary  Jola Schmidt, MD 02/22/16 [place], MD 02/22/16 [phone]

## 2016-02-22 NOTE — Progress Notes (Signed)
PT Cancellation Note  Patient Details Name: Alexandria Walton MRN: [id number] DOB: 06-Aug-1929   Cancelled Treatment:    Reason Eval/Treat Not Completed: Patient not medically ready.  Pt currently awaiting CT Angio for r/o dissection.  Will hold PT and mobility at this time.     Catarina Hartshorn, [place] 02/22/2016, 9:07 AM

## 2016-02-22 NOTE — Progress Notes (Signed)
[facility] Progress Note Patient Name: Alexandria Walton DOB: 08/12/1929 MRN: [id number]   Date of Service  02/22/2016  HPI/Events of Note  Request for NPO with sips and ice chips.  eICU Interventions  Will order NPO except for sips and ice chips.     Intervention Category Minor Interventions: Routine modifications to care plan (e.g. PRN medications for pain, fever)  Sommer,Steven Eugene 02/22/2016, 9:03 PM
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
## 2016-02-22 NOTE — ED Notes (Signed)
Trauma MD at the bedside. 

## 2016-02-23 ENCOUNTER — Inpatient Hospital Stay (HOSPITAL_COMMUNITY): Payer: Medicare Other

## 2016-02-23 DIAGNOSIS — I10 Essential (primary) hypertension: Secondary | ICD-10-CM

## 2016-02-23 DIAGNOSIS — N183 Chronic kidney disease, stage 3 (moderate): Secondary | ICD-10-CM

## 2016-02-23 LAB — MRSA PCR SCREENING: MRSA BY PCR: NEGATIVE

## 2016-02-23 LAB — CBC
HCT: 29.7 % — ABNORMAL LOW (ref 36.0–46.0)
Hemoglobin: 9.8 g/dL — ABNORMAL LOW (ref 12.0–15.0)
MCH: 30.4 pg (ref 26.0–34.0)
MCHC: 33 g/dL (ref 30.0–36.0)
MCV: 92.2 fL (ref 78.0–100.0)
PLATELETS: 95 10*3/uL — AB (ref 150–400)
RBC: 3.22 MIL/uL — AB (ref 3.87–5.11)
RDW: 13.7 % (ref 11.5–15.5)
WBC: 10.4 10*3/uL (ref 4.0–10.5)

## 2016-02-23 LAB — BASIC METABOLIC PANEL
Anion gap: 11 (ref 5–15)
BUN: 27 mg/dL — ABNORMAL HIGH (ref 6–20)
CALCIUM: 9 mg/dL (ref 8.9–10.3)
CO2: 22 mmol/L (ref 22–32)
CREATININE: 1.72 mg/dL — AB (ref 0.44–1.00)
Chloride: 108 mmol/L (ref 101–111)
GFR calc non Af Amer: 26 mL/min — ABNORMAL LOW (ref >60)
GFR, EST AFRICAN AMERICAN: 30 mL/min — AB (ref >60)
Glucose, Bld: 105 mg/dL — ABNORMAL HIGH (ref 65–99)
Potassium: 4.2 mmol/L (ref 3.5–5.1)
SODIUM: 141 mmol/L (ref 135–145)

## 2016-02-23 MED ORDER — LABETALOL HCL 5 MG/ML IV SOLN
10.0000 mg | INTRAVENOUS | Status: DC | PRN
  Administered 2016-02-23 (×3): 10 mg via INTRAVENOUS
  Administered 2016-02-23: 20 mg via INTRAVENOUS
  Administered 2016-02-23 (×2): 10 mg via INTRAVENOUS
  Administered 2016-02-24 (×2): 20 mg via INTRAVENOUS
  Filled 2016-02-23 (×6): qty 4

## 2016-02-23 MED ORDER — SODIUM CHLORIDE 0.9 % IV SOLN
INTRAVENOUS | Status: AC
  Administered 2016-02-23: 09:00:00 via INTRAVENOUS

## 2016-02-23 MED ORDER — METOPROLOL TARTRATE 25 MG PO TABS
25.0000 mg | ORAL_TABLET | Freq: Two times a day (BID) | ORAL | Status: DC
  Administered 2016-02-23 (×2): 25 mg via ORAL
  Filled 2016-02-23 (×2): qty 1

## 2016-02-23 MED ORDER — CETYLPYRIDINIUM CHLORIDE 0.05 % MT LIQD
7.0000 mL | Freq: Two times a day (BID) | OROMUCOSAL | Status: DC
  Administered 2016-02-23 – 2016-02-25 (×5): 7 mL via OROMUCOSAL

## 2016-02-23 MED ORDER — HYDRALAZINE HCL 20 MG/ML IJ SOLN
10.0000 mg | INTRAMUSCULAR | Status: DC | PRN
  Administered 2016-02-23 – 2016-02-24 (×3): 10 mg via INTRAVENOUS
  Filled 2016-02-23 (×4): qty 1

## 2016-02-23 NOTE — Progress Notes (Signed)
Pt with tele orders and bed on 2W; will cont. To monitor BP and leave in ICU currently; PRN meds just previously given; will cont. To monitor.  Alexandria Walton
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
## 2016-02-23 NOTE — Progress Notes (Signed)
Transfer patient to [facility] service as of am 4/19 0700.     Noe Gens, NP-C [facility] Pgr: [phone] or if no answer [phone] 02/23/2016, 1:44 PM

## 2016-02-23 NOTE — Progress Notes (Signed)
[facility] Progress Note Patient Name: Alexandria Walton DOB: [birth year] MRN: [id number]   Date of Service  02/23/2016  HPI/Events of Note  Hypertension - BP = 166/78. Goal SBP < 140.  eICU Interventions  Will add Hydralazine 10 mg IV Q 4 hours PRN SBP > 140.     Intervention Category Intermediate Interventions: Hypertension - evaluation and management  Corbin Falck Eugene 02/23/2016, 7:01 PM

## 2016-02-23 NOTE — Progress Notes (Signed)
Patient ID: Alexandria Walton, female   DOB: 1929-07-30, 82 y.o.   MRN: [id number] [facility] DAILY ICU PROGRESS NOTE                   [address]            [place] [address]          [phone]        Total Length of Stay:  LOS: 1 day   Subjective: Comfortable in icu on bed rest due to left leg, no chest pain, only left leg pain  Objective: Vital signs in last 24 hours: Temp:  [97.7 F (36.5 C)-98.4 F (36.9 C)] 98.4 F (36.9 C) (04/18 0731) Pulse Rate:  [64-97] 74 (04/18 2130) Cardiac Rhythm:  [-] Normal sinus rhythm (04/18 0600) Resp:  [12-29] 18 (04/18 0633) BP: (75-177)/(48-99) 147/72 mmHg (04/18 0633) SpO2:  [89 %-97 %] 92 % (04/18 0633) Weight:  [152 lb 12.5 oz (69.3 kg)] 152 lb 12.5 oz (69.3 kg) (04/17 2030)  Filed Weights   02/21/16 1449 02/22/16 2030  Weight: 155 lb (70.308 kg) 152 lb 12.5 oz (69.3 kg)    Weight change: -2 lb 3.5 oz (-1.008 kg)   Hemodynamic parameters for last 24 hours:    Intake/Output from previous day: 04/17 0701 - 04/18 0700 In: 842.3 [I.V.:842.3] Out: -   Intake/Output this shift:    Current Meds: Scheduled Meds: . sodium chloride   Intravenous STAT  . antiseptic oral rinse  7 mL Mouth Rinse BID  . B-complex with vitamin C  1 tablet Oral Daily  . donepezil  10 mg Oral QHS  . simvastatin  40 mg Oral QHS  . sodium chloride flush  3 mL Intravenous Q12H   Continuous Infusions: . sodium chloride 75 mL/hr at 02/22/16 2000  . labetalol (NORMODYNE) infusion 0.25 mg/min (02/23/16 0643)   PRN Meds:.acetaminophen **OR** acetaminophen, bisacodyl, fentaNYL (SUBLIMAZE) injection, gi cocktail, hydroxypropyl methylcellulose / hypromellose, ondansetron **OR** ondansetron (ZOFRAN) IV, polyethylene glycol  General appearance: alert, cooperative and no distress Neurologic: intact Heart: regular rate and rhythm, S1, S2 normal, no murmur, click, rub or gallop Lungs: clear to auscultation bilaterally Abdomen: soft, non-tender;  bowel sounds normal; no masses,  no organomegaly Extremities: brace on left leg, distal pulses intact, lower extremities neuro intact  Lab Results: CBC: Recent Labs  02/21/16 1641 02/23/16 0617  WBC 8.3 10.4  HGB 11.6* 9.8*  HCT 36.1 29.7*  PLT 147* 95*   BMET:  Recent Labs  02/21/16 1641 02/23/16 0617  NA 141 141  K 4.3 4.2  CL 105 108  CO2 23 22  GLUCOSE 102* 105*  BUN 16 27*  CREATININE 1.50* 1.72*  CALCIUM 9.8 9.0    PT/INR: No results for input(s): LABPROT, INR in the last 72 hours.  Chronic Walton Disease   Stage I     GFR >90  Stage II    GFR 60-89  Stage IIIA GFR 45-59  Stage IIIB GFR 30-44  Stage IV   GFR 15-29  Stage V    GFR  <15  Lab Results  Component Value Date   CREATININE 1.72* 02/23/2016   Estimated Creatinine Clearance: 23.7 mL/min (by C-G formula based on Cr of 1.72).  Acute Walton Injury (any one)  Increase in SCr by > 0.3 within 48 hours  Increase SCr to > 1.5 times baseline  Urine volume < 0.5 ml/kg/h for 6 hrs  ?Stage 1 - Increase in serum creatinine  to 1.5 to 1.9 times baseline, or increase in serum creatinine by ?0.3 mg/dL (?26.5 micromol/L), or reduction in urine output to <0.5 mL/kg per hour for 6 to 12 hours.  ?Stage 2 - Increase in serum creatinine to 2.0 to 2.9 times baseline, or reduction in urine output to <0.5 mL/kg per hour for ?12 hours.  ?Stage 3 - Increase in serum creatinine to 3.0 times baseline, or increase in serum creatinine to ?4.0 mg/dL (?353.6 micromol/L), or reduction in urine output to <0.3 mL/kg per hour for ?24 hours, or anuria for ?12 hours, or the initiation of renal replacement therapy, or, in patients <18 years, decrease in eGFR to <35 mL   Lab Results  Component Value Date   CREATININE 1.72* 02/23/2016   Estimated Creatinine Clearance: 23.7 mL/min (by C-G formula based on Cr of 1.72).  Assessment/Plan: Confortable, mobility limited by left leg fracture bp better controlled Mild deterioration  of  renal function AKI stage 1  with underlying chronic Walton disease - contrast related ? hgb drop from admission, multifactors, dissection, fracture, hydration iv fluid    Check chest xray for effusion  Grace Isaac 02/23/2016 7:40 AM

## 2016-02-24 DIAGNOSIS — I160 Hypertensive urgency: Secondary | ICD-10-CM

## 2016-02-24 LAB — CBC
HCT: 30.8 % — ABNORMAL LOW (ref 36.0–46.0)
Hemoglobin: 9.9 g/dL — ABNORMAL LOW (ref 12.0–15.0)
MCH: 29.8 pg (ref 26.0–34.0)
MCHC: 32.1 g/dL (ref 30.0–36.0)
MCV: 92.8 fL (ref 78.0–100.0)
Platelets: 89 10*3/uL — ABNORMAL LOW (ref 150–400)
RBC: 3.32 MIL/uL — ABNORMAL LOW (ref 3.87–5.11)
RDW: 13.8 % (ref 11.5–15.5)
WBC: 11.3 10*3/uL — AB (ref 4.0–10.5)

## 2016-02-24 LAB — BASIC METABOLIC PANEL
ANION GAP: 11 (ref 5–15)
BUN: 22 mg/dL — AB (ref 6–20)
CALCIUM: 9 mg/dL (ref 8.9–10.3)
CO2: 22 mmol/L (ref 22–32)
Chloride: 109 mmol/L (ref 101–111)
Creatinine, Ser: 1.37 mg/dL — ABNORMAL HIGH (ref 0.44–1.00)
GFR calc Af Amer: 40 mL/min — ABNORMAL LOW (ref >60)
GFR, EST NON AFRICAN AMERICAN: 34 mL/min — AB (ref >60)
GLUCOSE: 106 mg/dL — AB (ref 65–99)
Potassium: 4 mmol/L (ref 3.5–5.1)
SODIUM: 142 mmol/L (ref 135–145)

## 2016-02-24 MED ORDER — SODIUM CHLORIDE 0.9 % IV SOLN: INTRAVENOUS | Status: DC

## 2016-02-24 MED ORDER — METOPROLOL TARTRATE 50 MG PO TABS
50.0000 mg | ORAL_TABLET | Freq: Two times a day (BID) | ORAL | Status: DC
Start: 2016-02-24 — End: 2016-02-25
  Administered 2016-02-24 (×2): 50 mg via ORAL
  Filled 2016-02-24 (×2): qty 1

## 2016-02-24 NOTE — Progress Notes (Signed)
[name] BP elevated throughout the night. Multiple doses of labetolol, hydralazine and fentanyl given to keep patient's SBP in range of 120-140. Patient complains on pain in left leg, but rested well throughout the night. See MAR and flowsheets for more details.
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
## 2016-02-24 NOTE — Progress Notes (Signed)
Patient ID: ANDI ORDERS, female   DOB: 1928/11/24, 82 y.o.   MRN: [id number] TCTS DAILY ICU PROGRESS NOTE                   [address]            [place] [address]          [phone]        Total Length of Stay:  LOS: 2 days   Subjective: Did not sleep during night , BP elevated , chest pain when bp elevated   Objective: Vital signs in last 24 hours: Temp:  [98.1 F (36.7 C)-99.8 F (37.7 C)] 98.1 F (36.7 C) (04/19 0400) Pulse Rate:  [30-106] 84 (04/19 0700) Cardiac Rhythm:  [-] Normal sinus rhythm (04/19 0730) Resp:  [13-28] 16 (04/19 0700) BP: (89-172)/(55-91) 128/74 mmHg (04/19 0700) SpO2:  [91 %-99 %] 95 % (04/19 0700)  Filed Weights   02/21/16 1449 02/22/16 2030  Weight: 155 lb (70.308 kg) 152 lb 12.5 oz (69.3 kg)    Weight change:    Hemodynamic parameters for last 24 hours:    Intake/Output from previous day: 04/18 0701 - 04/19 0700 In: 2123.7 [P.O.:240; I.V.:1883.7] Out: 1100 [Urine:1100]  Intake/Output this shift:    Current Meds: Scheduled Meds: . antiseptic oral rinse  7 mL Mouth Rinse BID  . B-complex with vitamin C  1 tablet Oral Daily  . donepezil  10 mg Oral QHS  . metoprolol tartrate  25 mg Oral BID  . simvastatin  40 mg Oral QHS  . sodium chloride flush  3 mL Intravenous Q12H   Continuous Infusions: . sodium chloride 75 mL/hr at 02/24/16 0700   PRN Meds:.acetaminophen **OR** acetaminophen, bisacodyl, fentaNYL (SUBLIMAZE) injection, gi cocktail, hydrALAZINE, hydroxypropyl methylcellulose / hypromellose, labetalol, ondansetron **OR** ondansetron (ZOFRAN) IV, polyethylene glycol  General appearance: alert and no distress Neurologic: intact Heart: regular rate and rhythm, S1, S2 normal, no murmur, click, rub or gallop Lungs: clear to auscultation bilaterally Abdomen: soft, non-tender; bowel sounds normal; no masses,  no organomegaly Extremities: extremities normal, atraumatic, no cyanosis or edema and Homans sign is  negative, no sign of DVT Wound: no wounds  Left leg brace in place   Lab Results: CBC: Recent Labs  02/23/16 0617 02/24/16 0254  WBC 10.4 11.3*  HGB 9.8* 9.9*  HCT 29.7* 30.8*  PLT 95* 89*   BMET:  Recent Labs  02/23/16 0617 02/24/16 0254  NA 141 142  K 4.2 4.0  CL 108 109  CO2 22 22  GLUCOSE 105* 106*  BUN 27* 22*  CREATININE 1.72* 1.37*  CALCIUM 9.0 9.0    PT/INR: No results for input(s): LABPROT, INR in the last 72 hours. Radiology: No results found.   Assessment/Plan: No surgical treatment Work on BP control per ccm and TRH Thrombocytopenia mild  hgb stable since yesterday Renal function improved from yesterday  Grace Isaac 02/24/2016 8:31 AM

## 2016-02-24 NOTE — [place] FL2 (Signed)
[place] LEVEL OF CARE SCREENING TOOL     IDENTIFICATION  Patient Name: Alexandria Walton Birthdate: 1928-11-16 Sex: female Admission Date (Current Location): 02/21/2016  [facility] and [place] Number:  Herbalist and Address:  [facility]. [facility], [place] [address] [place],  [address]      Provider Number: [id number]  Attending Physician Name and Address:  Cherene Altes, MD  Relative Name and Phone Number:  Carter-Zieglar,[name]    Current Level of Care: Hospital Recommended Level of Care: [place] Prior Approval Number:    Date Approved/Denied:   PASRR Number: [id number]  Discharge Plan: SNF    Current Diagnoses: Patient Active Problem List   Diagnosis Date Noted  . Hypertensive urgency   . Dissection of aorta, thoracic ([place]) 02/22/2016  . Dissecting aneurysm of aorta ([place]) 02/22/2016  . Hypertension 02/22/2016  . Tibial plateau fracture, left 02/22/2016  . Upper back pain 02/22/2016  . Chronic kidney disease, stage 3 02/22/2016  . Ascending aortic dissection ([place]) 02/22/2016  . Left tibial fracture   . CKD (chronic kidney disease)   . Memory loss 01/30/2013  . Unspecified sleep apnea 12/25/2012  . Headache(784.0) 12/25/2012    Orientation RESPIRATION BLADDER Height & Weight     Self, Situation, Place  Normal Incontinent Weight: 152 lb 12.5 oz (69.3 kg) Height:  5\' 7"  (170.2 cm)  BEHAVIORAL SYMPTOMS/MOOD NEUROLOGICAL BOWEL NUTRITION STATUS   (None)  (None) Continent Diet (Soft)  AMBULATORY STATUS COMMUNICATION OF NEEDS Skin   Extensive Assist Verbally Normal                       Personal Care Assistance Level of Assistance  Bathing, Feeding, Dressing Bathing Assistance: Limited assistance Feeding assistance: Independent Dressing Assistance: Limited assistance     Functional Limitations Info  Sight, Hearing, Speech Sight Info: Adequate Hearing Info: Impaired Speech Info: Adequate     SPECIAL CARE FACTORS FREQUENCY  PT (By licensed PT), OT (By licensed OT)     PT Frequency: 5/ week OT Frequency: 5/ week            Contractures Contractures Info: Not present    Additional Factors Info  Allergies Code Status Info: DNR Allergies Info: Codeine, Morphine And Related           Current Medications (02/24/2016):  This is the current hospital active medication list Current Facility-Administered Medications  Medication Dose Route Frequency Provider Last Rate Last Dose  . acetaminophen (TYLENOL) tablet 650 mg  650 mg Oral Q6H PRN Willia Craze, NP       Or  . acetaminophen (TYLENOL) suppository 650 mg  650 mg Rectal Q6H PRN Willia Craze, NP      . antiseptic oral rinse (CPC / CETYLPYRIDINIUM CHLORIDE 0.05%) solution 7 mL  7 mL Mouth Rinse BID Kara Mead V, MD   7 mL at 02/24/16 1000  . B-complex with vitamin C tablet 1 tablet  1 tablet Oral Daily Harvel Quale, MD   1 tablet at 02/24/16 [phone]  . bisacodyl (DULCOLAX) EC tablet 5 mg  5 mg Oral Daily PRN Willia Craze, NP      . donepezil (ARICEPT) tablet 10 mg  10 mg Oral QHS Harvel Quale, MD   10 mg at 02/23/16 2121  . fentaNYL (SUBLIMAZE) injection 12.5-50 mcg  12.5-50 mcg Intravenous Q2H PRN Corey Harold, NP   50 mcg at 02/24/16 [id number]  .  gi cocktail (Maalox,Lidocaine,Donnatal)  30 mL Oral TID PRN Waldemar Dickens, MD      . hydroxypropyl methylcellulose / hypromellose (ISOPTO TEARS / GONIOVISC) 2.5 % ophthalmic solution 1 drop  1 drop Both Eyes QID PRN Harvel Quale, MD      . labetalol (NORMODYNE,TRANDATE) injection 10-20 mg  10-20 mg Intravenous Q2H PRN Rigoberto Noel, MD   20 mg at 02/24/16 0852  . metoprolol (LOPRESSOR) tablet 50 mg  50 mg Oral BID Rigoberto Noel, MD   50 mg at 02/24/16 0938  . ondansetron (ZOFRAN) tablet 4 mg  4 mg Oral Q6H PRN Willia Craze, NP       Or  . ondansetron [facility]) injection 4 mg  4 mg Intravenous Q6H PRN Willia Craze, NP      . polyethylene glycol  (MIRALAX / GLYCOLAX) packet 17 g  17 g Oral Daily PRN Willia Craze, NP      . simvastatin (ZOCOR) tablet 40 mg  40 mg Oral QHS Harvel Quale, MD   40 mg at 02/23/16 2120  . sodium chloride flush (NS) 0.9 % injection 3 mL  3 mL Intravenous Q12H Willia Craze, NP   3 mL at 02/24/16 1000     Discharge Medications: Please see discharge summary for a list of discharge medications.  Relevant Imaging Results:  Relevant Lab Results:   Additional Information [id number]  Samule Dry, LCSW
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
## 2016-02-24 NOTE — Progress Notes (Addendum)
Attempted to call report, RN unavailable at this time, a/w callback.   Sherrie Mustache 1:05 PM   Report called to RN on [facility]. Kathleen Argue S 1:46 PM
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
## 2016-02-24 NOTE — Clinical Social Work Placement (Signed)
   CLINICAL SOCIAL WORK PLACEMENT  NOTE  Date:  02/24/2016  Patient Details  Name: HERAN STROHECKER MRN: [id number] Date of Birth: Jan 07, 1929  Clinical Social Work is seeking post-discharge placement for this patient at the [place] level of care (*CSW will initial, date and re-position this form in  chart as items are completed):  Yes   Patient/family provided with [place] Work Department's list of facilities offering this level of care within the geographic area requested by the patient (or if unable, by the patient's family).  Yes   Patient/family informed of their freedom to choose among providers that offer the needed level of care, that participate in Medicare, Medicaid or managed care program needed by the patient, have an available bed and are willing to accept the patient.  Yes   Patient/family informed of [place]'s ownership interest in [facility] and [facility], as well as of the fact that they are under no obligation to receive care at these facilities.  PASRR submitted to EDS on 02/24/16     PASRR number received on 02/24/16     Existing PASRR number confirmed on       FL2 transmitted to all facilities in geographic area requested by pt/family on 02/24/16     FL2 transmitted to all facilities within larger geographic area on       Patient informed that his/her managed care company has contracts with or will negotiate with certain facilities, including the following:            Patient/family informed of bed offers received.  Patient chooses bed at       Physician recommends and patient chooses bed at      Patient to be transferred to   on  .  Patient to be transferred to facility by       Patient family notified on   of transfer.  Name of family member notified:        PHYSICIAN Please prepare priority discharge summary, including medications, Please sign FL2, Please prepare prescriptions, Please sign DNR      Additional Comment:    _______________________________________________ Samule Dry, LCSW 02/24/2016, 4:14 PM
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
## 2016-02-24 NOTE — Progress Notes (Signed)
Pt transferred to 2west via bed, vss, transferred to floor bed, receiving RN at bedside, patient's granddaughter made aware of new room.  Sherrie Mustache 2:18 PM

## 2016-02-25 LAB — CBC
HEMATOCRIT: 29 % — AB (ref 36.0–46.0)
HEMOGLOBIN: 9.3 g/dL — AB (ref 12.0–15.0)
MCH: 29.6 pg (ref 26.0–34.0)
MCHC: 32.1 g/dL (ref 30.0–36.0)
MCV: 92.4 fL (ref 78.0–100.0)
Platelets: 90 10*3/uL — ABNORMAL LOW (ref 150–400)
RBC: 3.14 MIL/uL — ABNORMAL LOW (ref 3.87–5.11)
RDW: 13.8 % (ref 11.5–15.5)
WBC: 10.2 10*3/uL (ref 4.0–10.5)

## 2016-02-25 LAB — IRON AND TIBC
Iron: 12 ug/dL — ABNORMAL LOW (ref 28–170)
SATURATION RATIOS: 6 % — AB (ref 10.4–31.8)
TIBC: 211 ug/dL — AB (ref 250–450)
UIBC: 199 ug/dL

## 2016-02-25 LAB — RENAL FUNCTION PANEL
ANION GAP: 11 (ref 5–15)
Albumin: 2.8 g/dL — ABNORMAL LOW (ref 3.5–5.0)
BUN: 24 mg/dL — AB (ref 6–20)
CHLORIDE: 110 mmol/L (ref 101–111)
CO2: 21 mmol/L — ABNORMAL LOW (ref 22–32)
Calcium: 9 mg/dL (ref 8.9–10.3)
Creatinine, Ser: 1.36 mg/dL — ABNORMAL HIGH (ref 0.44–1.00)
GFR calc Af Amer: 40 mL/min — ABNORMAL LOW (ref >60)
GFR, EST NON AFRICAN AMERICAN: 35 mL/min — AB (ref >60)
GLUCOSE: 102 mg/dL — AB (ref 65–99)
POTASSIUM: 4 mmol/L (ref 3.5–5.1)
Phosphorus: 2.6 mg/dL (ref 2.5–4.6)
Sodium: 142 mmol/L (ref 135–145)

## 2016-02-25 LAB — FOLATE: Folate: 25.8 ng/mL (ref >5.9)

## 2016-02-25 LAB — RETICULOCYTES
RBC.: 3.14 MIL/uL — AB (ref 3.87–5.11)
RETIC CT PCT: 1 % (ref 0.4–3.1)
Retic Count, Absolute: 31.4 10*3/uL (ref 19.0–186.0)

## 2016-02-25 LAB — VITAMIN B12: Vitamin B-12: 687 pg/mL (ref 180–914)

## 2016-02-25 LAB — FERRITIN: Ferritin: 312 ng/mL — ABNORMAL HIGH (ref 11–307)

## 2016-02-25 MED ORDER — AMLODIPINE BESYLATE 5 MG PO TABS
5.0000 mg | ORAL_TABLET | Freq: Every day | ORAL | Status: DC
  Administered 2016-02-25 – 2016-02-26 (×2): 5 mg via ORAL
  Filled 2016-02-25 (×2): qty 1

## 2016-02-25 MED ORDER — ATORVASTATIN CALCIUM 20 MG PO TABS
20.0000 mg | ORAL_TABLET | Freq: Every day | ORAL | Status: DC
  Administered 2016-02-25: 20 mg via ORAL
  Filled 2016-02-25: qty 1

## 2016-02-25 MED ORDER — METOPROLOL TARTRATE 100 MG PO TABS
100.0000 mg | ORAL_TABLET | Freq: Two times a day (BID) | ORAL | Status: DC
  Administered 2016-02-25 – 2016-02-26 (×3): 100 mg via ORAL
  Filled 2016-02-25 (×3): qty 1

## 2016-02-25 NOTE — Progress Notes (Deleted)
Episodes of increased HR - patient sitting on EOB - encouraged to lie down. HR returned to 74-80/ minute. Remains NPO for a stress test.
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
## 2016-02-25 NOTE — Care Management Important Message (Signed)
Important Message  Patient Details  Name: Alexandria Walton MRN: [id number] Date of Birth: 02-27-1929   Medicare Important Message Given:  Yes    [name] [name] 02/25/2016, 4:07 PM
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
## 2016-02-25 NOTE — Progress Notes (Addendum)
      [address]       [place] [address]             [phone]      Subjective:  No new complaints.  Some mild chest discomfort  Objective: Vital signs in last 24 hours: Temp:  [98.6 F (37 C)-98.9 F (37.2 C)] 98.8 F (37.1 C) (04/20 0320) Pulse Rate:  [74-100] 88 (04/20 0320) Cardiac Rhythm:  [-] Normal sinus rhythm (04/19 1950) Resp:  [18-23] 20 (04/20 0320) BP: (105-163)/(64-87) 142/84 mmHg (04/20 0320) SpO2:  [94 %-96 %] 94 % (04/20 0320)  Intake/Output from previous day: 04/19 0701 - 04/20 0700 In: 270 [P.O.:120; I.V.:150] Out: 450 [Urine:450]  General appearance: alert, cooperative and no distress Heart: regular rate and rhythm Lungs: clear to auscultation bilaterally Abdomen: soft, non-tender; bowel sounds normal; no masses,  no organomegaly Extremities: extremities normal, atraumatic, no cyanosis or edema Wound: clean and dry  Lab Results:  Recent Labs  02/24/16 0254 02/25/16 0207  WBC 11.3* 10.2  HGB 9.9* 9.3*  HCT 30.8* 29.0*  PLT 89* 90*   BMET:  Recent Labs  02/24/16 0254 02/25/16 0207  NA 142 142  K 4.0 4.0  CL 109 110  CO2 22 21*  GLUCOSE 106* 102*  BUN 22* 24*  CREATININE 1.37* 1.36*  CALCIUM 9.0 9.0    PT/INR: No results for input(s): LABPROT, INR in the last 72 hours. ABG    Component Value Date/Time   TCO2 28 04/08/2010 1151   CBG (last 3)  No results for input(s): GLUCAP in the last 72 hours.  Assessment/Plan:  1. Presumed Aortic Arch Dissection- patient did not want surgery and poor candidate to recover well  2. HTN- improved, continue management for better control 3. Renal- creatinine stable 4. H/H- remains stable 5. Dispo- patient stable, care per primary   LOS: 3 days    BARRETT, ERIN 02/25/2016  I have seen and examined [name] and agree with the above assessment  and plan.  Grace Isaac MD Beeper [phone] Office [phone] 02/25/2016 9:01 AM

## 2016-02-25 NOTE — Progress Notes (Signed)
Patient lying in bed, no needs at this time. Visitors at bedside, call light within reach.

## 2016-02-25 NOTE — Clinical Social Work Note (Signed)
CSW provide patient and patient's family with bed offers. CSW to follow up with the patient and patient's family for bed choice. CSW will facilitate discharge needs.

## 2016-02-26 MED ORDER — AMLODIPINE BESYLATE 5 MG PO TABS: 5.0000 mg | ORAL_TABLET | Freq: Every day | ORAL | Status: DC

## 2016-02-26 MED ORDER — ACETAMINOPHEN 325 MG PO TABS: 650.0000 mg | ORAL_TABLET | Freq: Four times a day (QID) | ORAL | Status: DC | PRN

## 2016-02-26 MED ORDER — METOPROLOL TARTRATE 100 MG PO TABS: 100.0000 mg | ORAL_TABLET | Freq: Two times a day (BID) | ORAL | Status: DC

## 2016-02-26 MED ORDER — BISACODYL 5 MG PO TBEC: 5.0000 mg | DELAYED_RELEASE_TABLET | Freq: Every day | ORAL | Status: DC | PRN

## 2016-02-26 MED ORDER — POLYETHYLENE GLYCOL 3350 17 G PO PACK: 17.0000 g | PACK | Freq: Every day | ORAL | Status: DC | PRN

## 2016-02-26 MED ORDER — TRAMADOL HCL 50 MG PO TABS: 50.0000 mg | ORAL_TABLET | Freq: Three times a day (TID) | ORAL | Status: DC | PRN

## 2016-02-26 MED ORDER — ATORVASTATIN CALCIUM 20 MG PO TABS: 20.0000 mg | ORAL_TABLET | Freq: Every day | ORAL | Status: DC

## 2016-02-26 NOTE — Clinical Social Work Placement (Signed)
   CLINICAL SOCIAL WORK PLACEMENT  NOTE  Date:  02/26/2016  Patient Details  Name: Alexandria Walton MRN: [id number] Date of Birth: [birth year]  Clinical Social Work is seeking post-discharge placement for this patient at the [place] level of care (*CSW will initial, date and re-position this form in  chart as items are completed):  Yes   Patient/family provided with [place] Work Department's list of facilities offering this level of care within the geographic area requested by the patient (or if unable, by the patient's family).  Yes   Patient/family informed of their freedom to choose among providers that offer the needed level of care, that participate in Medicare, Medicaid or managed care program needed by the patient, have an available bed and are willing to accept the patient.  Yes   Patient/family informed of [place]'s ownership interest in [facility] and [facility], as well as of the fact that they are under no obligation to receive care at these facilities.  PASRR submitted to EDS on 02/24/16     PASRR number received on 02/24/16     Existing PASRR number confirmed on       FL2 transmitted to all facilities in geographic area requested by pt/family on 02/24/16     FL2 transmitted to all facilities within larger geographic area on       Patient informed that his/her managed care company has contracts with or will negotiate with certain facilities, including the following:        Yes   Patient/family informed of bed offers received.  Patient chooses bed at [facility]     Physician recommends and patient chooses bed at      Patient to be transferred to [facility] on 02/26/16.  Patient to be transferred to facility by       Patient family notified on 02/26/16 of transfer.  Name of family member notified:  pt's daughter Arville Go     PHYSICIAN Please prepare priority discharge summary, including  medications, Please sign FL2, Please prepare prescriptions, Please sign DNR     Additional Comment:   Per MD patient is ready to discharge to [facility]. RN, patient, patient's family, and facility notified of discharge. RN given phone number for report and transport packet is on patient's chart. Ambulance transport requested. CSW signing off.  _______________________________________________ Samule Dry, LCSW 02/26/2016, 3:01 PM

## 2016-02-26 NOTE — Discharge Instructions (Signed)
Aortic Dissection °An aortic dissection is a tear in your aorta. The aorta is the main blood vessel that carries blood out of your heart to supply the rest of your body. It comes out of your heart and curves around, then goes down through your chest (thoracic aorta) and into your belly (abdominal aorta). The wall of the aorta has inner and outer layers. °Aortic dissection occurs most often in the thoracic aorta. This is more likely to happen if the inner layer of the aorta has a weak spot or gets injured. As the dissection widens and blood flows through it, the aorta becomes "double-barreled." This means that one part of the aorta continues to carry blood. However, the inner wall begins to separate from the rest of the aorta as blood flows through the tear. The torn part of the aorta fills with blood. It swells up like a balloon. This can reduce blood flow through the part of the aorta that is still working. Aortic dissection is a medical emergency. °CAUSES °Aortic dissection happens when there is a tear in the inner wall of the aorta. An injury or weakness can cause this tear. Sometimes the exact cause of the tear is not known. °RISK FACTORS °You may be at greater risk for aortic dissection if you: °· Have certain medical conditions, such as uncontrolled high blood pressure or atherosclerosis. °· Have a blunt injury to your chest. °· Have a genetic disorder that affects the connective tissue, such as Marfan syndrome, Turner syndrome, and Ehlers-Danlos syndrome. °· Are born with a problem that affects either your aorta or your heart valve. °· Have a condition that causes inflammation of blood vessels, such as giant cell arteritis. °· Are female. °· Are older than 80 years of age. °· Use cocaine. °· Smoke. °· Lift very heavy weights or do other types of high-intensity resistance training. °SIGNS AND SYMPTOMS  °Signs and symptoms of aortic dissection may start suddenly. Changes in position may make symptoms worse. The  most common symptoms are: °· Severe chest pain that may feel like a tearing, stabbing, or sharp pain. °· Pain that shifts to the shoulder, arm, neck, jaw, abdomen, or hips. °Other symptoms may include: °· Severe abdominal pain. °· Trouble breathing. °· Dizziness or fainting. °· Nausea or vomiting. °· Trouble swallowing. °· Sweating a lot. °· Feeling confused, dazed, anxious, or fearful. °DIAGNOSIS °Your health care provider may suspect aortic dissection based on your signs and symptoms and will perform a physical exam. During the physical exam, your health care provider may listen for abnormal blood flow sounds (murmurs) in your chest or your belly. You may also have your blood pressure checked to see whether it is low or whether there is a difference between the measurements in your arms and your legs. You may also have tests such as: °· Electrocardiogram (ECG). This is a test that measures the electrical activity in your heart. °· Chest X-ray. °· CT scan or MRI. °· Aortic angiogram. This test uses the injection of a dye to make it easier to see your blood vessels clearly. °· Echocardiogram to study your heart using sound waves. °TREATMENT °It is important to treat an aortic dissection as quickly as possible. Your treatment may start as soon as your health care provider suspects aortic dissection. Treatment will depend on how severe your dissection is, where it is located, and your overall health. Treatment options include: °· Medicines to lower your blood pressure. °· Surgery to remove the dissected part of   your aorta and replace it with a graft. °· Medical procedures to thread long, thin tubes (catheters) into the aorta (endovascular procedures). This may be done to place a graft or a balloon in the blood vessel to improve blood flow or prevent further dissection. °HOME CARE INSTRUCTIONS °· Work with your health care provider to keep your blood pressure under control. °· Avoid activities that could cause an  injury to your chest or your abdomen. °· Do not smoke. If you need help quitting, ask your health care provider. °· Do not participate in sports or exercises that involve lifting weights. °· Keep all follow-up visits as directed by your health care provider. This is important. °SEEK MEDICAL CARE IF: °· You develop any new symptoms of aortic dissection after treatment. °SEEK IMMEDIATE MEDICAL CARE IF: °· You have severe pain in your chest or your abdomen. °· You have trouble breathing. °These symptoms may represent a serious problem that is an emergency. Do not wait to see if the symptoms will go away. Get medical help right away. Call your local emergency services (911 in the [place] Do not drive yourself to the hospital. °  °This information is not intended to replace advice given to you by your health care provider. Make sure you discuss any questions you have with your health care provider. °  °Document Released: 01/31/2008 Document Revised: 11/14/2014 Document Reviewed: 06/04/2014 °Elsevier Interactive Patient Education ©2016 [employer] ° °
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
## 2016-07-25 ENCOUNTER — Inpatient Hospital Stay (HOSPITAL_COMMUNITY)
Admission: EM | Admit: 2016-07-25 | Discharge: 2016-08-01 | DRG: 987 | Disposition: A | Discharge status: Home Health | Payer: Medicare Other | Attending: Family Medicine | Admitting: Family Medicine

## 2016-07-25 ENCOUNTER — Encounter (HOSPITAL_COMMUNITY): Payer: Self-pay | Admitting: *Deleted

## 2016-07-25 ENCOUNTER — Emergency Department (HOSPITAL_COMMUNITY): Payer: Medicare Other

## 2016-07-25 DIAGNOSIS — L03116 Cellulitis of left lower limb: Secondary | ICD-10-CM | POA: Diagnosis present

## 2016-07-25 DIAGNOSIS — Z79899 Other long term (current) drug therapy: Secondary | ICD-10-CM

## 2016-07-25 DIAGNOSIS — D649 Anemia, unspecified: Secondary | ICD-10-CM | POA: Diagnosis present

## 2016-07-25 DIAGNOSIS — D631 Anemia in chronic kidney disease: Secondary | ICD-10-CM | POA: Diagnosis present

## 2016-07-25 DIAGNOSIS — E43 Unspecified severe protein-calorie malnutrition: Secondary | ICD-10-CM | POA: Insufficient documentation

## 2016-07-25 DIAGNOSIS — E7800 Pure hypercholesterolemia, unspecified: Secondary | ICD-10-CM | POA: Diagnosis present

## 2016-07-25 DIAGNOSIS — Z86711 Personal history of pulmonary embolism: Secondary | ICD-10-CM

## 2016-07-25 DIAGNOSIS — Z87891 Personal history of nicotine dependence: Secondary | ICD-10-CM | POA: Diagnosis not present

## 2016-07-25 DIAGNOSIS — N183 Chronic kidney disease, stage 3 unspecified: Secondary | ICD-10-CM | POA: Diagnosis present

## 2016-07-25 DIAGNOSIS — Z23 Encounter for immunization: Secondary | ICD-10-CM | POA: Diagnosis not present

## 2016-07-25 DIAGNOSIS — I129 Hypertensive chronic kidney disease with stage 1 through stage 4 chronic kidney disease, or unspecified chronic kidney disease: Secondary | ICD-10-CM | POA: Diagnosis present

## 2016-07-25 DIAGNOSIS — Z66 Do not resuscitate: Secondary | ICD-10-CM | POA: Diagnosis present

## 2016-07-25 DIAGNOSIS — K219 Gastro-esophageal reflux disease without esophagitis: Secondary | ICD-10-CM | POA: Diagnosis present

## 2016-07-25 DIAGNOSIS — L89623 Pressure ulcer of left heel, stage 3: Principal | ICD-10-CM | POA: Diagnosis present

## 2016-07-25 DIAGNOSIS — F0390 Unspecified dementia without behavioral disturbance: Secondary | ICD-10-CM | POA: Diagnosis present

## 2016-07-25 DIAGNOSIS — Z6820 Body mass index (BMI) 20.0-20.9, adult: Secondary | ICD-10-CM | POA: Diagnosis not present

## 2016-07-25 DIAGNOSIS — S91302A Unspecified open wound, left foot, initial encounter: Secondary | ICD-10-CM

## 2016-07-25 DIAGNOSIS — N179 Acute kidney failure, unspecified: Secondary | ICD-10-CM | POA: Diagnosis present

## 2016-07-25 DIAGNOSIS — G4733 Obstructive sleep apnea (adult) (pediatric): Secondary | ICD-10-CM | POA: Diagnosis present

## 2016-07-25 DIAGNOSIS — I10 Essential (primary) hypertension: Secondary | ICD-10-CM | POA: Diagnosis present

## 2016-07-25 DIAGNOSIS — B353 Tinea pedis: Secondary | ICD-10-CM | POA: Diagnosis present

## 2016-07-25 DIAGNOSIS — S91302D Unspecified open wound, left foot, subsequent encounter: Secondary | ICD-10-CM | POA: Diagnosis not present

## 2016-07-25 DIAGNOSIS — I2510 Atherosclerotic heart disease of native coronary artery without angina pectoris: Secondary | ICD-10-CM | POA: Diagnosis present

## 2016-07-25 DIAGNOSIS — L089 Local infection of the skin and subcutaneous tissue, unspecified: Secondary | ICD-10-CM | POA: Diagnosis present

## 2016-07-25 DIAGNOSIS — M79672 Pain in left foot: Secondary | ICD-10-CM | POA: Diagnosis present

## 2016-07-25 LAB — CBC WITH DIFFERENTIAL/PLATELET
Basophils Absolute: 0 10*3/uL (ref 0.0–0.1)
Basophils Relative: 0 %
EOS ABS: 0.1 10*3/uL (ref 0.0–0.7)
EOS PCT: 2 %
HCT: 32 % — ABNORMAL LOW (ref 36.0–46.0)
HEMOGLOBIN: 10 g/dL — AB (ref 12.0–15.0)
LYMPHS ABS: 1.6 10*3/uL (ref 0.7–4.0)
Lymphocytes Relative: 26 %
MCH: 29.3 pg (ref 26.0–34.0)
MCHC: 31.3 g/dL (ref 30.0–36.0)
MCV: 93.8 fL (ref 78.0–100.0)
MONOS PCT: 10 %
Monocytes Absolute: 0.6 10*3/uL (ref 0.1–1.0)
NEUTROS PCT: 62 %
Neutro Abs: 3.9 10*3/uL (ref 1.7–7.7)
Platelets: 235 10*3/uL (ref 150–400)
RBC: 3.41 MIL/uL — AB (ref 3.87–5.11)
RDW: 14.5 % (ref 11.5–15.5)
WBC: 6.1 10*3/uL (ref 4.0–10.5)

## 2016-07-25 LAB — BASIC METABOLIC PANEL WITH GFR
Anion gap: 6 (ref 5–15)
BUN: 28 mg/dL — ABNORMAL HIGH (ref 6–20)
CO2: 26 mmol/L (ref 22–32)
Calcium: 9.6 mg/dL (ref 8.9–10.3)
Chloride: 108 mmol/L (ref 101–111)
Creatinine, Ser: 1.85 mg/dL — ABNORMAL HIGH (ref 0.44–1.00)
GFR calc Af Amer: 27 mL/min — ABNORMAL LOW
GFR calc non Af Amer: 24 mL/min — ABNORMAL LOW
Glucose, Bld: 96 mg/dL (ref 65–99)
Potassium: 4.1 mmol/L (ref 3.5–5.1)
Sodium: 140 mmol/L (ref 135–145)

## 2016-07-25 LAB — I-STAT CG4 LACTIC ACID, ED: Lactic Acid, Venous: 0.74 mmol/L (ref 0.5–1.9)

## 2016-07-25 MED ORDER — ONDANSETRON HCL 4 MG/2ML IJ SOLN: 4.0000 mg | Freq: Four times a day (QID) | INTRAMUSCULAR | Status: DC | PRN

## 2016-07-25 MED ORDER — INFLUENZA VAC SPLIT QUAD 0.5 ML IM SUSY
0.5000 mL | PREFILLED_SYRINGE | Freq: Once | INTRAMUSCULAR | Status: DC
  Filled 2016-07-25: qty 0.5

## 2016-07-25 MED ORDER — HYDROCODONE-ACETAMINOPHEN 5-325 MG PO TABS
1.0000 | ORAL_TABLET | Freq: Four times a day (QID) | ORAL | Status: DC | PRN
  Administered 2016-07-27 – 2016-07-31 (×7): 1 via ORAL
  Filled 2016-07-25 (×7): qty 1

## 2016-07-25 MED ORDER — MIRABEGRON ER 50 MG PO TB24
50.0000 mg | ORAL_TABLET | Freq: Every day | ORAL | Status: DC
  Administered 2016-07-26 (×2): 50 mg via ORAL
  Filled 2016-07-25 (×3): qty 1

## 2016-07-25 MED ORDER — PNEUMOCOCCAL VAC POLYVALENT 25 MCG/0.5ML IJ INJ
0.5000 mL | INJECTION | Freq: Once | INTRAMUSCULAR | Status: DC
  Filled 2016-07-25: qty 0.5

## 2016-07-25 MED ORDER — ONDANSETRON HCL 4 MG PO TABS: 4.0000 mg | ORAL_TABLET | Freq: Four times a day (QID) | ORAL | Status: DC | PRN

## 2016-07-25 MED ORDER — SIMVASTATIN 40 MG PO TABS
40.0000 mg | ORAL_TABLET | Freq: Every day | ORAL | Status: DC
  Administered 2016-07-26: 40 mg via ORAL
  Filled 2016-07-25: qty 1

## 2016-07-25 MED ORDER — AMLODIPINE BESYLATE 5 MG PO TABS
5.0000 mg | ORAL_TABLET | Freq: Every day | ORAL | Status: DC
  Administered 2016-07-26 – 2016-08-01 (×7): 5 mg via ORAL
  Filled 2016-07-25 (×7): qty 1

## 2016-07-25 MED ORDER — PNEUMOCOCCAL VAC POLYVALENT 25 MCG/0.5ML IJ INJ
0.5000 mL | INJECTION | INTRAMUSCULAR | Status: AC
  Administered 2016-07-26: 0.5 mL via INTRAMUSCULAR
  Filled 2016-07-25 (×2): qty 0.5

## 2016-07-25 MED ORDER — PIPERACILLIN-TAZOBACTAM 3.375 G IVPB 30 MIN
3.3750 g | Freq: Once | INTRAVENOUS | Status: AC
  Administered 2016-07-25: 3.375 g via INTRAVENOUS
  Filled 2016-07-25: qty 50

## 2016-07-25 MED ORDER — ENOXAPARIN SODIUM 30 MG/0.3ML ~~LOC~~ SOLN
30.0000 mg | SUBCUTANEOUS | Status: DC
  Administered 2016-07-26 – 2016-07-31 (×7): 30 mg via SUBCUTANEOUS
  Filled 2016-07-25 (×7): qty 0.3

## 2016-07-25 MED ORDER — POLYETHYLENE GLYCOL 3350 17 G PO PACK
17.0000 g | PACK | Freq: Every day | ORAL | Status: DC | PRN
  Administered 2016-07-26: 17 g via ORAL

## 2016-07-25 MED ORDER — PIPERACILLIN-TAZOBACTAM 3.375 G IVPB
3.3750 g | Freq: Three times a day (TID) | INTRAVENOUS | Status: DC
  Administered 2016-07-26 – 2016-07-31 (×18): 3.375 g via INTRAVENOUS
  Filled 2016-07-25 (×22): qty 50

## 2016-07-25 MED ORDER — HYDRALAZINE HCL 20 MG/ML IJ SOLN: 5.0000 mg | INTRAMUSCULAR | Status: DC | PRN

## 2016-07-25 MED ORDER — ACETAMINOPHEN 325 MG PO TABS
650.0000 mg | ORAL_TABLET | Freq: Four times a day (QID) | ORAL | Status: DC | PRN
  Administered 2016-07-26 – 2016-07-27 (×2): 650 mg via ORAL
  Filled 2016-07-25 (×3): qty 2

## 2016-07-25 MED ORDER — INFLUENZA VAC SPLIT QUAD 0.5 ML IM SUSY
0.5000 mL | PREFILLED_SYRINGE | INTRAMUSCULAR | Status: AC
  Administered 2016-07-26: 0.5 mL via INTRAMUSCULAR
  Filled 2016-07-25 (×2): qty 0.5

## 2016-07-25 MED ORDER — SODIUM CHLORIDE 0.9 % IV BOLUS (SEPSIS)
1000.0000 mL | Freq: Once | INTRAVENOUS | Status: AC
  Administered 2016-07-25: 1000 mL via INTRAVENOUS

## 2016-07-25 MED ORDER — ENSURE ENLIVE PO LIQD
237.0000 mL | Freq: Two times a day (BID) | ORAL | Status: DC
  Administered 2016-07-26 – 2016-08-01 (×8): 237 mL via ORAL

## 2016-07-25 MED ORDER — PIPERACILLIN-TAZOBACTAM 3.375 G IVPB: 3.3750 g | Freq: Three times a day (TID) | INTRAVENOUS | Status: DC

## 2016-07-25 NOTE — ED Notes (Signed)
Bed: WTR9 Expected date:  Expected time:  Means of arrival:  Comments: 

## 2016-07-25 NOTE — Progress Notes (Addendum)
Pharmacy Antibiotic Note  Alexandria Walton is a 82 y.o. female with PMH of CKD stage III, HTN, CAD, dementia admitted on 07/25/2016 with left foot infection. She has failed multiple courses of antibiotics over the past month. Pharmacy has been consulted for Zosyn dosing.  Plan: Zosyn 3.375g IV x 1 over 30 minutes given in the ED. Continue with Zosyn 3.375g IV q8h (each dose infused over 4 hours) for CrCl > 20 ml/min. Monitor renal function, culture results as available, clinical course.  Height: 5\' 8"  (172.7 cm) Weight: 136 lb (61.7 kg) IBW/kg (Calculated) : 63.9  Temp (24hrs), Avg:97.9 F (36.6 C), Min:97.8 F (36.6 C), Max:98.2 F (36.8 C)   Recent Labs Lab 07/25/16 1318 07/25/16 1335  WBC 6.1  --   CREATININE 1.85*  --   LATICACIDVEN  --  0.74    Estimated Creatinine Clearance: 21.7 mL/min (by C-G formula based on SCr of 1.85 mg/dL (H)).    Allergies  Allergen Reactions  . Codeine Nausea Only  . Morphine And Related Nausea Only    Antimicrobials this admission: 9/18 >> Zosyn >>  Dose adjustments this admission: --  Microbiology results: None ordered  Thank you for allowing pharmacy to be a part of this patient's care.   Lindell Spar, PharmD, BCPS Pager: [phone] 07/25/2016 6:33 PM
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
## 2016-07-26 DIAGNOSIS — L89623 Pressure ulcer of left heel, stage 3: Principal | ICD-10-CM

## 2016-07-26 DIAGNOSIS — E43 Unspecified severe protein-calorie malnutrition: Secondary | ICD-10-CM | POA: Insufficient documentation

## 2016-07-26 LAB — CBC
HEMATOCRIT: 27.8 % — AB (ref 36.0–46.0)
Hemoglobin: 8.9 g/dL — ABNORMAL LOW (ref 12.0–15.0)
MCH: 29.1 pg (ref 26.0–34.0)
MCHC: 32 g/dL (ref 30.0–36.0)
MCV: 90.8 fL (ref 78.0–100.0)
PLATELETS: 180 10*3/uL (ref 150–400)
RBC: 3.06 MIL/uL — ABNORMAL LOW (ref 3.87–5.11)
RDW: 14.2 % (ref 11.5–15.5)
WBC: 5 10*3/uL (ref 4.0–10.5)

## 2016-07-26 LAB — BASIC METABOLIC PANEL
Anion gap: 6 (ref 5–15)
BUN: 23 mg/dL — AB (ref 6–20)
CHLORIDE: 111 mmol/L (ref 101–111)
CO2: 25 mmol/L (ref 22–32)
CREATININE: 1.51 mg/dL — AB (ref 0.44–1.00)
Calcium: 9.2 mg/dL (ref 8.9–10.3)
GFR calc Af Amer: 35 mL/min — ABNORMAL LOW (ref >60)
GFR calc non Af Amer: 30 mL/min — ABNORMAL LOW (ref >60)
Glucose, Bld: 80 mg/dL (ref 65–99)
POTASSIUM: 4 mmol/L (ref 3.5–5.1)
Sodium: 142 mmol/L (ref 135–145)

## 2016-07-26 MED ORDER — METOPROLOL TARTRATE 12.5 MG HALF TABLET
12.5000 mg | ORAL_TABLET | Freq: Two times a day (BID) | ORAL | Status: DC
  Administered 2016-07-26 – 2016-08-01 (×13): 12.5 mg via ORAL
  Filled 2016-07-26 (×14): qty 1

## 2016-07-26 MED ORDER — ATORVASTATIN CALCIUM 20 MG PO TABS
20.0000 mg | ORAL_TABLET | Freq: Every day | ORAL | Status: DC
  Administered 2016-07-26 – 2016-07-31 (×6): 20 mg via ORAL
  Filled 2016-07-26: qty 1
  Filled 2016-07-26 (×2): qty 2
  Filled 2016-07-26 (×3): qty 1
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
## 2016-07-26 NOTE — Evaluation (Signed)
Physical Therapy Evaluation Patient Details Name: Alexandria Walton MRN: [id number] DOB: 1929/03/03 Today's Date: 07/26/2016   History of Present Illness   Alexandria Walton is a 82 y.o. female w/ PMH including CAD, AAA, CKD, dementia, PE who presented to ED with L foot wound and left foot pain.  Clinical Impression  Pt admitted with above diagnosis. Pt currently with functional limitations due to the deficits listed below (see PT Problem List). * Pt will benefit from skilled PT to increase their independence and safety with mobility to allow discharge to the venue listed below.   Very pleasant and cooperative, recommend HHPT at D/c for strengthening and fall prevention; will continue to follow     Follow Up Recommendations Home health PT    Equipment Recommendations  None recommended by PT    Recommendations for Other Services       Precautions / Restrictions Precautions Precautions: Fall      Mobility  Bed Mobility Overal bed mobility: Needs Assistance Bed Mobility: Supine to Sit     Supine to sit: Modified independent (Device/Increase time);HOB elevated     General bed mobility comments: incr time, no physical assist  Transfers Overall transfer level: Needs assistance Equipment used: Rolling walker (2 wheeled) Transfers: Sit to/from Stand Sit to Stand: Min guard         General transfer comment: for balance for transition to RW, min/guard to control descent to low chair  Ambulation/Gait Ambulation/Gait assistance: Min guard Ambulation Distance (Feet): 80 Feet Assistive device: Rolling walker (2 wheeled) Gait Pattern/deviations: Step-through pattern;Decreased stride length;Trunk flexed     General Gait Details: cues for posture, RW position and overall safety; slow gait, no LOB, mild unsteadiness at first  Stairs            Wheelchair Mobility    Modified Rankin (Stroke Patients Only)       Balance Overall balance assessment: Needs assistance    Sitting balance-Leahy Scale: Good       Standing balance-Leahy Scale: Fair Standing balance comment: maintains static balance brielfy with close supervision                             Pertinent Vitals/Pain Pain Assessment: No/denies pain    Home Living Family/patient expects to be discharged to:: Private residence Living Arrangements: Alone Available Help at Discharge: Other (Comment);Personal care attendant Type of Home: House       Home Layout: One level Home Equipment: Walker - 2 wheels Additional Comments: pt reports she has a "friend" from church that comes 4hrs/day 5d/wk; caregiver assists mostly with meals and cleaning; pt reports her dtr comes from [place] at least 1x per month and assists with laundry and whatever needs to be done    Prior Function Level of Independence: Independent;Independent with assistive device(s)         Comments: amb with RW at baseline     Hand Dominance        Extremity/Trunk Assessment   Upper Extremity Assessment: Defer to OT evaluation           Lower Extremity Assessment: Generalized weakness      Cervical / Trunk Assessment: Kyphotic  Communication   Communication: No difficulties  Cognition Arousal/Alertness: Awake/alert Behavior During Therapy: WFL for tasks assessed/performed Overall Cognitive Status: Within Functional Limits for tasks assessed  General Comments      Exercises     Assessment/Plan    PT Assessment Patient needs continued PT services  PT Problem List Decreased strength;Decreased activity tolerance;Decreased balance;Decreased mobility          PT Treatment Interventions DME instruction;Gait training;Functional mobility training;Therapeutic exercise;Therapeutic activities    PT Goals (Current goals can be found in the Care Plan section)  Acute Rehab PT Goals Patient Stated Goal: home PT Goal Formulation: With patient Time For Goal Achievement:  11-Aug-2016 Potential to Achieve Goals: Good    Frequency Min 3X/week   Barriers to discharge        Co-evaluation               End of Session Equipment Utilized During Treatment: Gait belt Activity Tolerance: Patient tolerated treatment well Patient left: with call bell/phone within reach;in chair;with chair alarm set           Time: [id number] PT Time Calculation (min) (ACUTE ONLY): 14 min   Charges:   PT Evaluation $PT Eval Low Complexity: 1 Procedure     PT G Codes:        Hudson Lehmkuhl 08-11-2016, 10:09 AM
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
## 2016-07-26 NOTE — Progress Notes (Signed)
The order for simvastatin(Zocor) was changed to an equivalent dose of atorvastatin(Lipitor) due to the potential drug interaction with amlodipine.  When taken in combination with medications that inhibit its metabolism, simvastatin can accumulate which increases the risk of liver toxicity, myopathy, or rhabdomyolysis.  Simvastatin dose should not exceed 10mg /day in patients taking verapamil, diltiazem, fibrates, or niacin >or= 1g/day.   Simvastatin dose should not exceed 20mg /day in patients taking amlodipine, ranolazine or amiodarone.   Please consider this potential interaction at discharge.  Royetta Asal, PharmD, BCPS Pager [phone] 07/26/2016 7:40 AM
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
## 2016-07-27 DIAGNOSIS — Z66 Do not resuscitate: Secondary | ICD-10-CM

## 2016-07-27 DIAGNOSIS — F0390 Unspecified dementia without behavioral disturbance: Secondary | ICD-10-CM

## 2016-07-27 DIAGNOSIS — L03116 Cellulitis of left lower limb: Secondary | ICD-10-CM

## 2016-07-27 DIAGNOSIS — N183 Chronic kidney disease, stage 3 (moderate): Secondary | ICD-10-CM

## 2016-07-27 DIAGNOSIS — I10 Essential (primary) hypertension: Secondary | ICD-10-CM

## 2016-07-27 LAB — BASIC METABOLIC PANEL
ANION GAP: 7 (ref 5–15)
BUN: 25 mg/dL — ABNORMAL HIGH (ref 6–20)
CHLORIDE: 109 mmol/L (ref 101–111)
CO2: 26 mmol/L (ref 22–32)
Calcium: 9.3 mg/dL (ref 8.9–10.3)
Creatinine, Ser: 1.63 mg/dL — ABNORMAL HIGH (ref 0.44–1.00)
GFR, EST AFRICAN AMERICAN: 32 mL/min — AB (ref >60)
GFR, EST NON AFRICAN AMERICAN: 28 mL/min — AB (ref >60)
Glucose, Bld: 79 mg/dL (ref 65–99)
POTASSIUM: 4 mmol/L (ref 3.5–5.1)
SODIUM: 142 mmol/L (ref 135–145)

## 2016-07-27 LAB — CBC
HEMATOCRIT: 27.9 % — AB (ref 36.0–46.0)
HEMOGLOBIN: 8.8 g/dL — AB (ref 12.0–15.0)
MCH: 28.6 pg (ref 26.0–34.0)
MCHC: 31.5 g/dL (ref 30.0–36.0)
MCV: 90.6 fL (ref 78.0–100.0)
Platelets: 190 10*3/uL (ref 150–400)
RBC: 3.08 MIL/uL — ABNORMAL LOW (ref 3.87–5.11)
RDW: 14.3 % (ref 11.5–15.5)
WBC: 6 10*3/uL (ref 4.0–10.5)

## 2016-07-27 LAB — GLUCOSE, CAPILLARY: GLUCOSE-CAPILLARY: 77 mg/dL (ref 65–99)

## 2016-07-27 MED ORDER — MIRABEGRON ER 25 MG PO TB24
25.0000 mg | ORAL_TABLET | Freq: Every day | ORAL | Status: DC
  Administered 2016-07-27 – 2016-08-01 (×5): 25 mg via ORAL
  Filled 2016-07-27 (×7): qty 1
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
## 2016-07-28 ENCOUNTER — Other Ambulatory Visit (HOSPITAL_COMMUNITY): Payer: Self-pay | Admitting: Family

## 2016-07-28 DIAGNOSIS — L089 Local infection of the skin and subcutaneous tissue, unspecified: Secondary | ICD-10-CM

## 2016-07-28 MED ORDER — CHLORHEXIDINE GLUCONATE 4 % EX LIQD
60.0000 mL | Freq: Once | CUTANEOUS | Status: AC
  Administered 2016-07-28: 4 via TOPICAL
  Filled 2016-07-28: qty 60

## 2016-07-28 MED ORDER — CEFAZOLIN SODIUM-DEXTROSE 2-4 GM/100ML-% IV SOLN
2.0000 g | INTRAVENOUS | Status: AC
  Administered 2016-07-29: 2 g via INTRAVENOUS
  Filled 2016-07-28 (×2): qty 100

## 2016-07-28 NOTE — Progress Notes (Signed)
Report called to care link 

## 2016-07-28 NOTE — Progress Notes (Signed)
PT Cancellation Note  Patient Details Name: Alexandria Walton MRN: [id number] DOB: Dec 16, 1928   Cancelled Treatment:    Reason Eval/Treat Not Completed: Other (comment) (pt amb  to [facility] with nursing student, asked PT to come back "much later" ), unable to return today, will see again as schedule permits;   [facility] 07/28/2016, 2:13 PM

## 2016-07-28 NOTE — Progress Notes (Signed)
[facility] is transporting to [facility] [place] room 5. Report called to Gabe, RN , Care Link called and report given. Awaiting transport.

## 2016-07-28 NOTE — Progress Notes (Signed)
Pharmacy Antibiotic Note  Alexandria Walton is a 82 y.o. female with PMH of CKD stage III, HTN, CAD, dementia admitted on 07/25/2016 with left foot infection. She has failed multiple courses of antibiotics over the past month. Pharmacy has been consulted for Zosyn dosing. Pt is scheduled to transfer to [facility] for surgery.  Plan: Zosyn 3.375g IV x 1 over 30 minutes given in the ED. Continue with Zosyn 3.375g IV q8h (each dose infused over 4 hours) for CrCl > 20 ml/min. Monitor renal function, culture results as available, clinical course.  Height: 5\' 8"  (172.7 cm) Weight: 136 lb (61.7 kg) IBW/kg (Calculated) : 63.9  Temp (24hrs), Avg:98.4 F (36.9 C), Min:98.2 F (36.8 C), Max:98.6 F (37 C)   Recent Labs Lab 07/25/16 1318 07/25/16 1335 07/26/16 0525 07/27/16 0516  WBC 6.1  --  5.0 6.0  CREATININE 1.85*  --  1.51* 1.63*  LATICACIDVEN  --  0.74  --   --     Estimated Creatinine Clearance: 24.6 mL/min (by C-G formula based on SCr of 1.63 mg/dL (H)).    Allergies  Allergen Reactions  . Codeine Nausea Only  . Morphine And Related Nausea Only    Antimicrobials this admission: 9/18 >> Zosyn >>  Dose adjustments this admission: --  Microbiology results: None ordered  Thank you for allowing pharmacy to be a part of this patient's care.   Royetta Asal, PharmD, BCPS Pager [phone] 07/28/2016 12:57 PM

## 2016-07-28 NOTE — Progress Notes (Signed)
Pt tranported via carelink to [place] [facility].  Report called to Alm[place], RN per Koren Shiver, RN.
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
## 2016-07-28 NOTE — Progress Notes (Signed)
Pt transported via [facility]link to [place] [facility].

## 2016-07-29 ENCOUNTER — Inpatient Hospital Stay (HOSPITAL_COMMUNITY): Payer: Medicare Other | Admitting: Certified Registered Nurse Anesthetist

## 2016-07-29 ENCOUNTER — Encounter (HOSPITAL_COMMUNITY): Payer: Self-pay | Admitting: Certified Registered Nurse Anesthetist

## 2016-07-29 ENCOUNTER — Encounter (HOSPITAL_COMMUNITY)
Admission: EM | Disposition: A | Discharge status: Home Health | Payer: Self-pay | Source: Home / Self Care | Attending: Family Medicine

## 2016-07-29 DIAGNOSIS — N179 Acute kidney failure, unspecified: Secondary | ICD-10-CM

## 2016-07-29 HISTORY — PX: I&D EXTREMITY: SHX5045

## 2016-07-29 LAB — SURGICAL PCR SCREEN
MRSA, PCR: NEGATIVE
Staphylococcus aureus: NEGATIVE

## 2016-07-29 SURGERY — IRRIGATION AND DEBRIDEMENT EXTREMITY
Anesthesia: Regional | Laterality: Left

## 2016-07-29 MED ORDER — FERROUS SULFATE 325 (65 FE) MG PO TABS
325.0000 mg | ORAL_TABLET | Freq: Two times a day (BID) | ORAL | Status: DC
  Administered 2016-07-29 – 2016-08-01 (×6): 325 mg via ORAL
  Filled 2016-07-29 (×6): qty 1

## 2016-07-29 MED ORDER — PROPOFOL 10 MG/ML IV BOLUS
INTRAVENOUS | Status: DC | PRN
  Administered 2016-07-29: 30 mg via INTRAVENOUS
  Administered 2016-07-29: 50 mg via INTRAVENOUS
  Administered 2016-07-29: 20 mg via INTRAVENOUS

## 2016-07-29 MED ORDER — ONDANSETRON HCL 4 MG PO TABS: 4.0000 mg | ORAL_TABLET | Freq: Four times a day (QID) | ORAL | Status: DC | PRN

## 2016-07-29 MED ORDER — ACETAMINOPHEN 325 MG PO TABS
650.0000 mg | ORAL_TABLET | Freq: Four times a day (QID) | ORAL | Status: DC | PRN
Start: 2016-07-29 — End: 2016-08-01
  Administered 2016-07-30: 650 mg via ORAL
  Filled 2016-07-29: qty 2

## 2016-07-29 MED ORDER — PROPOFOL 10 MG/ML IV BOLUS
INTRAVENOUS | Status: AC
  Filled 2016-07-29: qty 20

## 2016-07-29 MED ORDER — FENTANYL CITRATE (PF) 100 MCG/2ML IJ SOLN
INTRAMUSCULAR | Status: AC
  Filled 2016-07-29: qty 4

## 2016-07-29 MED ORDER — METOCLOPRAMIDE HCL 5 MG/ML IJ SOLN: 5.0000 mg | Freq: Three times a day (TID) | INTRAMUSCULAR | Status: DC | PRN

## 2016-07-29 MED ORDER — METHOCARBAMOL 1000 MG/10ML IJ SOLN
500.0000 mg | Freq: Four times a day (QID) | INTRAVENOUS | Status: DC | PRN
  Filled 2016-07-29: qty 5

## 2016-07-29 MED ORDER — METOCLOPRAMIDE HCL 5 MG PO TABS: 5.0000 mg | ORAL_TABLET | Freq: Three times a day (TID) | ORAL | Status: DC | PRN

## 2016-07-29 MED ORDER — SODIUM CHLORIDE 0.9 % IV SOLN: INTRAVENOUS | Status: DC

## 2016-07-29 MED ORDER — LACTATED RINGERS IV SOLN
INTRAVENOUS | Status: DC | PRN
  Administered 2016-07-29: 15:00:00 via INTRAVENOUS

## 2016-07-29 MED ORDER — OXYCODONE HCL 5 MG/5ML PO SOLN: 5.0000 mg | Freq: Once | ORAL | Status: DC | PRN

## 2016-07-29 MED ORDER — OXYCODONE HCL 5 MG PO TABS: 5.0000 mg | ORAL_TABLET | Freq: Once | ORAL | Status: DC | PRN

## 2016-07-29 MED ORDER — ONDANSETRON HCL 4 MG/2ML IJ SOLN: 4.0000 mg | Freq: Four times a day (QID) | INTRAMUSCULAR | Status: DC | PRN

## 2016-07-29 MED ORDER — LACTATED RINGERS IV SOLN
INTRAVENOUS | Status: DC
  Administered 2016-07-29: 15:00:00 via INTRAVENOUS

## 2016-07-29 MED ORDER — FENTANYL CITRATE (PF) 100 MCG/2ML IJ SOLN: 25.0000 ug | INTRAMUSCULAR | Status: DC | PRN

## 2016-07-29 MED ORDER — ACETAMINOPHEN 650 MG RE SUPP: 650.0000 mg | Freq: Four times a day (QID) | RECTAL | Status: DC | PRN

## 2016-07-29 MED ORDER — METHOCARBAMOL 500 MG PO TABS
500.0000 mg | ORAL_TABLET | Freq: Four times a day (QID) | ORAL | Status: DC | PRN
  Administered 2016-07-30 – 2016-07-31 (×3): 500 mg via ORAL
  Filled 2016-07-29 (×6): qty 1

## 2016-07-29 MED ORDER — FENTANYL CITRATE (PF) 100 MCG/2ML IJ SOLN
INTRAMUSCULAR | Status: DC | PRN
  Administered 2016-07-29: 50 ug via INTRAVENOUS
  Administered 2016-07-29 (×2): 25 ug via INTRAVENOUS

## 2016-07-29 MED ORDER — LIDOCAINE HCL (CARDIAC) 20 MG/ML IV SOLN
INTRAVENOUS | Status: DC | PRN
  Administered 2016-07-29: 100 mg via INTRAVENOUS

## 2016-07-29 SURGICAL SUPPLY — 39 items
BLADE SURG 10 STRL SS (BLADE) IMPLANT
BLADE SURG 21 STRL SS (BLADE) ×3 IMPLANT
BNDG COHESIVE 4X5 TAN STRL (GAUZE/BANDAGES/DRESSINGS) ×3 IMPLANT
BNDG COHESIVE 6X5 TAN STRL LF (GAUZE/BANDAGES/DRESSINGS) IMPLANT
BNDG GAUZE ELAST 4 BULKY (GAUZE/BANDAGES/DRESSINGS) ×3 IMPLANT
COVER SURGICAL LIGHT HANDLE (MISCELLANEOUS) ×6 IMPLANT
DRAPE U-SHAPE 47X51 STRL (DRAPES) ×3 IMPLANT
DRSG ADAPTIC 3X8 NADH LF (GAUZE/BANDAGES/DRESSINGS) ×3 IMPLANT
DRSG PAD ABDOMINAL 8X10 ST (GAUZE/BANDAGES/DRESSINGS) ×3 IMPLANT
DURAPREP 26ML APPLICATOR (WOUND CARE) ×3 IMPLANT
ELECT CAUTERY BLADE 6.4 (BLADE) ×3 IMPLANT
ELECT REM PT RETURN 9FT ADLT (ELECTROSURGICAL)
ELECTRODE REM PT RTRN 9FT ADLT (ELECTROSURGICAL) IMPLANT
GAUZE SPONGE 4X4 12PLY STRL (GAUZE/BANDAGES/DRESSINGS) ×3 IMPLANT
GLOVE BIOGEL PI IND STRL 9 (GLOVE) ×1 IMPLANT
GLOVE BIOGEL PI INDICATOR 9 (GLOVE) ×2
GLOVE SURG ORTHO 9.0 STRL STRW (GLOVE) ×3 IMPLANT
GOWN STRL REUS W/ TWL LRG LVL3 (GOWN DISPOSABLE) ×1 IMPLANT
GOWN STRL REUS W/ TWL XL LVL3 (GOWN DISPOSABLE) ×2 IMPLANT
GOWN STRL REUS W/TWL LRG LVL3 (GOWN DISPOSABLE) ×2
GOWN STRL REUS W/TWL XL LVL3 (GOWN DISPOSABLE) ×4
HANDPIECE INTERPULSE COAX TIP (DISPOSABLE)
KIT BASIN OR (CUSTOM PROCEDURE TRAY) ×3 IMPLANT
KIT ROOM TURNOVER OR (KITS) ×3 IMPLANT
MANIFOLD NEPTUNE II (INSTRUMENTS) ×3 IMPLANT
NS IRRIG 1000ML POUR BTL (IV SOLUTION) ×3 IMPLANT
PACK ORTHO EXTREMITY (CUSTOM PROCEDURE TRAY) ×3 IMPLANT
PAD ARMBOARD 7.5X6 YLW CONV (MISCELLANEOUS) ×6 IMPLANT
SET HNDPC FAN SPRY TIP SCT (DISPOSABLE) IMPLANT
STOCKINETTE IMPERVIOUS 9X36 MD (GAUZE/BANDAGES/DRESSINGS) IMPLANT
SWAB COLLECTION DEVICE MRSA (MISCELLANEOUS) IMPLANT
TOWEL OR 17X24 6PK STRL BLUE (TOWEL DISPOSABLE) ×3 IMPLANT
TOWEL OR 17X26 10 PK STRL BLUE (TOWEL DISPOSABLE) ×3 IMPLANT
TUBE ANAEROBIC SPECIMEN COL (MISCELLANEOUS) IMPLANT
TUBE CONNECTING 12'X1/4 (SUCTIONS) ×1
TUBE CONNECTING 12X1/4 (SUCTIONS) ×2 IMPLANT
UNDERPAD 30X30 (UNDERPADS AND DIAPERS) ×3 IMPLANT
WATER STERILE IRR 1000ML POUR (IV SOLUTION) IMPLANT
YANKAUER SUCT BULB TIP NO VENT (SUCTIONS) ×3 IMPLANT

## 2016-07-29 NOTE — Anesthesia Preprocedure Evaluation (Signed)
Anesthesia Evaluation  Patient identified by MRN, date of birth, ID band Patient awake    Reviewed: Allergy & Precautions, H&P , NPO status , [name] Chart, lab work & pertinent test results  Airway Mallampati: II   Neck ROM: full    Dental   Pulmonary sleep apnea , former smoker,    breath sounds clear to auscultation       Cardiovascular hypertension, + CAD and + Peripheral Vascular Disease   Rhythm:regular Rate:Normal     Neuro/Psych  Headaches,    GI/Hepatic GERD  ,  Endo/Other    Renal/GU Renal InsufficiencyRenal disease     Musculoskeletal  (+) Arthritis ,   Abdominal   Peds  Hematology   Anesthesia Other Findings   Reproductive/Obstetrics                             Anesthesia Physical Anesthesia Plan  ASA: III  Anesthesia Plan: General and Regional   Post-op Pain Management:    Induction: Intravenous  Airway Management Planned: LMA  Additional Equipment:   Intra-op Plan:   Post-operative Plan:   Informed Consent: I have reviewed the patients History and Physical, chart, labs and discussed the procedure including the risks, benefits and alternatives for the proposed anesthesia with the patient or authorized representative who has indicated his/her understanding and acceptance.     Plan Discussed with: CRNA, Anesthesiologist and Surgeon  Anesthesia Plan Comments:         Anesthesia Quick Evaluation

## 2016-07-29 NOTE — Interval H&P Note (Signed)
History and Physical Interval Note:  07/29/2016 7:19 AM  Alexandria Walton  has presented today for surgery, with the diagnosis of Left Foot Infection  The various methods of treatment have been discussed with the patient and family. After consideration of risks, benefits and other options for treatment, the patient has consented to  Procedure(s): Left Partial Calcaneus Excision (Left) as a surgical intervention .  The patient's history has been reviewed, patient examined, no change in status, stable for surgery.  I have reviewed the patient's chart and labs.  Questions were answered to the patient's satisfaction.     Newt Minion
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
## 2016-07-29 NOTE — Progress Notes (Signed)
Physical Therapy Treatment Patient Details Name: Alexandria Walton MRN: [id number] DOB: 1928/11/30 Today's Date: 07/29/2016    History of Present Illness Alexandria Walton is a 82 y.o. female w/ PMH including CAD, AAA, CKD, dementia, PE who presented to [facility] ED with L foot wound and left foot pain.     PT Comments    Pt presented supine in bed with HOB elevated, awake and willing to participate in therapy session. Pt's daughter was present throughout session as well. Pt would continue to benefit from skilled physical therapy services at this time while admitted and after d/c to address her limitations in order to improve her overall safety and independence with functional mobility.   Follow Up Recommendations  Home health PT;Supervision - Intermittent     Equipment Recommendations  None recommended by PT    Recommendations for Other Services       Precautions / Restrictions Precautions Precautions: Fall Restrictions Weight Bearing Restrictions: No    Mobility  Bed Mobility Overal bed mobility: Needs Assistance Bed Mobility: Supine to Sit     Supine to sit: Modified independent (Device/Increase time);HOB elevated     General bed mobility comments: pt required increased time to complete  Transfers Overall transfer level: Needs assistance Equipment used: Rolling walker (2 wheeled) Transfers: Sit to/from Stand Sit to Stand: Supervision         General transfer comment: pt required increased time  Ambulation/Gait Ambulation/Gait assistance: Min guard Ambulation Distance (Feet): 150 Feet Assistive device: Rolling walker (2 wheeled) Gait Pattern/deviations: Step-through pattern;Decreased step length - right;Decreased step length - left;Decreased stride length;Trunk flexed Gait velocity: decreased   General Gait Details: pt moving very slowly and occasionally paused for a standing rest break. pt maintained kyphotic posture throughout   Stairs            Wheelchair  Mobility    Modified Rankin (Stroke Patients Only)       Balance Overall balance assessment: Needs assistance Sitting-balance support: Feet supported;No upper extremity supported Sitting balance-Leahy Scale: Fair     Standing balance support: During functional activity;Bilateral upper extremity supported Standing balance-Leahy Scale: Poor                      Cognition Arousal/Alertness: Awake/alert Behavior During Therapy: WFL for tasks assessed/performed Overall Cognitive Status: Within Functional Limits for tasks assessed                      Exercises      General Comments        Pertinent Vitals/Pain Pain Assessment: Faces Faces Pain Scale: Hurts little more Pain Location: L heel Pain Descriptors / Indicators: Shooting;Grimacing Pain Intervention(s): Monitored during session;Repositioned    Home Living                      Prior Function            PT Goals (current goals can now be found in the care plan section) Acute Rehab PT Goals Patient Stated Goal: return home PT Goal Formulation: With patient Time For Goal Achievement: 07/26/16 Potential to Achieve Goals: Good Progress towards PT goals: Progressing toward goals    Frequency    Min 3X/week      PT Plan Current plan remains appropriate    Co-evaluation             End of Session Equipment Utilized During Treatment: Gait belt Activity Tolerance: Patient tolerated treatment  well Patient left: in chair;with call bell/phone within reach;with family/visitor present     Time: 1025-1052 PT Time Calculation (min) (ACUTE ONLY): 27 min  Charges:  $Gait Training: 23-37 mins                    G CodesClearnce Sorrel Shiela Bruns 2016-07-31, 11:20 AM Sherie Don, PT, DPT [phone]

## 2016-07-29 NOTE — Progress Notes (Signed)
Patient is alert, oriented x4, admitted from [facility] accompanied with 3 medics, no signs of acute distress noted, VS WNL , dressing changed to left heel, old dressing with small yellow drainage, wound cleansed as ordered, no odor noted, PCR performed for surgery, patient is bathed with Hibiclens solution as ordered, denies pain, heel protectors on. Patient is NPO tonight for surgery. Will continue to monitor

## 2016-07-29 NOTE — Progress Notes (Signed)
Orthopedic Tech Progress Note Patient Details:  DESSENCE OSLER 1929-08-30 [id number]  Ortho Devices Type of Ortho Device: Postop shoe/boot Ortho Device/Splint Location: LLE prafo boot Ortho Device/Splint Interventions: Ordered, Application   Braulio Bosch 07/29/2016, 8:49 PM

## 2016-07-29 NOTE — Plan of Care (Signed)
Problem: Safety: Goal: Ability to remain free from injury will improve Outcome: Progressing No fall or injury noted, ambulate to BR with one person's assistance  Problem: Pain Managment: Goal: General experience of comfort will improve Outcome: Progressing Denies pain  Problem: Physical Regulation: Goal: Will remain free from infection Outcome: Not Progressing Left heel with wound draining small amount of yellow drainage, no foul odor noted  Problem: Bowel/Gastric: Goal: Will not experience complications related to bowel motility Outcome: Progressing No bowel issues reported

## 2016-07-29 NOTE — Op Note (Signed)
07/25/2016 - 07/29/2016  3:18 PM  PATIENT:  Alexandria Walton    PRE-OPERATIVE DIAGNOSIS:  Left Foot Infection  POST-OPERATIVE DIAGNOSIS:  Same  PROCEDURE:  Left Partial Calcaneus Excision Local tissue rearrangement for wound closure 7 x 3 cm.  SURGEON:  Newt Minion, MD  PHYSICIAN ASSISTANT:None ANESTHESIA:   General  PREOPERATIVE INDICATIONS:  [name] is a  82 y.o. female with a diagnosis of Left Foot Infection who failed conservative measures and elected for surgical management.    The risks benefits and alternatives were discussed with the patient preoperatively including but not limited to the risks of infection, bleeding, nerve injury, cardiopulmonary complications, the need for revision surgery, among others, and the patient was willing to proceed.  OPERATIVE IMPLANTS: None  OPERATIVE FINDINGS: Good petechial bleeding no abscess  OPERATIVE PROCEDURE: Patient was brought to the operating room after undergoing a regional block. After adequate levels of anesthesia were obtained patient's left lower extremity was prepped using DuraPrep draped into a sterile field. A incision was made longitudinally over the calcaneus which included the ulcerative tissue. This was resected in 1 block of tissue. Using osteotome patient underwent partial calcaneal excision. There was no evidence of osteomyelitis or infection at the level of bony resection and there is no deep abscess no necrotic tissue the wound margins were clean. The wound was irrigated with normal saline hemostasis was obtained. Local tissue rearrangement was used to close a wound 3 x 7 cm. A sterile compressive dressing was applied patient was taken to the PACU in stable condition.  Patient should continue her IV antibiotics for 24 hours postoperatively patient may discontinue IV antibiotics at that time patient may be discharged once she is safe with ambulation touchdown weightbearing on the left.

## 2016-07-29 NOTE — Transfer of Care (Signed)
Immediate Anesthesia Transfer of Care Note  Patient: Alexandria Walton  Procedure(s) Performed: Procedure(s): Left Partial Calcaneus Excision (Left)  Patient Location: PACU  Anesthesia Type:MAC combined with regional for post-op pain  Level of Consciousness: awake and alert   Airway & Oxygen Therapy: Patient Spontanous Breathing and Patient connected to nasal cannula oxygen  Post-op Assessment: Report given to RN, Post -op Vital signs reviewed and stable and Patient moving all extremities X 4  Post vital signs: Reviewed and stable  Last Vitals:  Vitals:   07/29/16 1406 07/29/16 1527  BP: (!) 171/85 (!) 160/93  Pulse: 86 95  Resp: 16 13  Temp: 36.7 C 36.4 C    Last Pain:  Vitals:   07/29/16 1527  TempSrc:   PainSc: 0-No pain      Patients Stated Pain Goal: 0 ([id number] [phone])  Complications: No apparent anesthesia complications

## 2016-07-30 LAB — CBC
HCT: 33.4 % — ABNORMAL LOW (ref 36.0–46.0)
Hemoglobin: 10.2 g/dL — ABNORMAL LOW (ref 12.0–15.0)
MCH: 28.7 pg (ref 26.0–34.0)
MCHC: 30.5 g/dL (ref 30.0–36.0)
MCV: 94.1 fL (ref 78.0–100.0)
PLATELETS: 234 10*3/uL (ref 150–400)
RBC: 3.55 MIL/uL — AB (ref 3.87–5.11)
RDW: 14.1 % (ref 11.5–15.5)
WBC: 6.9 10*3/uL (ref 4.0–10.5)

## 2016-07-30 NOTE — Care Management Note (Addendum)
Case Management Note  Patient Details  Name: [name] MRN: [id number] Date of Birth: 10-08-1929  Subjective/Objective:     82 yr old female s/p left foot partial calcaneal excision.            Action/Plan: Case manager spoke with patient concerning Home Health needs. She has been receiving [facility], no changes.[facility] manager contacted [facility], to inform her of patient's needs at discharge. Patient states her daughter will be assisting her for awhile.No DME needs.   Expected Discharge Date:   (unknown)               Expected Discharge Plan:  [place]  In-House Referral:  NA  Discharge planning Services  CM Consult  Post Acute Care Choice:  Home Health, Resumption of Svcs/PTA Provider Choice offered to:  Patient  DME Arranged:  N/A DME Agency:  NA  HH Arranged:  PT [place]:  [facility] (now [facility])  Status of Service:  Completed, signed off  If discussed at [employer] of Stay Meetings, dates discussed:    Additional Comments:  Ninfa Meeker, RN 07/30/2016, 10:29 AM
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
## 2016-07-30 NOTE — Progress Notes (Signed)
Physical Therapy Treatment Patient Details Name: Alexandria Walton MRN: [id number] DOB: 1929-03-18 Today's Date: 07/30/2016    History of Present Illness Alexandria Walton is a 82 y.o. female w/ PMH including CAD, AAA, CKD, dementia, PE who presented to [facility] ED with L foot wound and left foot pain. Pt is now s/p L calcaneal partial excision and is TDWB L LE with post-op boot.    PT Comments    Pt presented supine in bed with HOB elevated, awake and willing to participate in therapy session. Pt is now s/p L partial calcaneal excision performed yesterday (07/29/16). Pt is now TDWB L LE. Pt did not move as well during this session as previous session and was limited secondary to pain and fatigue. Pt would continue to benefit from skilled physical therapy services at this time while admitted and after d/c to address her limitations in order to improve her overall safety and independence with functional mobility.   Follow Up Recommendations  Home health PT;Supervision - Intermittent     Equipment Recommendations  Wheelchair (measurements PT);Wheelchair cushion (measurements PT);Other (comment) (w/c with elevating leg rests)    Recommendations for Other Services       Precautions / Restrictions Precautions Precautions: Fall Restrictions Weight Bearing Restrictions: Yes LLE Weight Bearing: Touchdown weight bearing    Mobility  Bed Mobility Overal bed mobility: Needs Assistance Bed Mobility: Supine to Sit     Supine to sit: Min assist;HOB elevated     General bed mobility comments: pt required min A with L LE movement  Transfers Overall transfer level: Needs assistance Equipment used: Rolling walker (2 wheeled) Transfers: Sit to/from [facility] Sit to Stand: From elevated surface;Min assist Stand pivot transfers: Min assist       General transfer comment: pt required increased time, VC'ing for bilateral hand placement and min A to power up to achieve full standing  position. Pt able to maintain TDWB L LE throughout transfers  Ambulation/Gait                 Stairs            Wheelchair Mobility    Modified Rankin (Stroke Patients Only)       Balance Overall balance assessment: Needs assistance Sitting-balance support: Feet supported;No upper extremity supported Sitting balance-Leahy Scale: Fair     Standing balance support: During functional activity;Bilateral upper extremity supported Standing balance-Leahy Scale: Poor                      Cognition Arousal/Alertness: Awake/alert Behavior During Therapy: WFL for tasks assessed/performed Overall Cognitive Status: Within Functional Limits for tasks assessed                      Exercises      General Comments        Pertinent Vitals/Pain Pain Assessment: Faces Faces Pain Scale: Hurts even more Pain Location: L foot Pain Descriptors / Indicators: Shooting;Grimacing;Guarding Pain Intervention(s): Monitored during session;Repositioned    Home Living                      Prior Function            PT Goals (current goals can now be found in the care plan section) Acute Rehab PT Goals Patient Stated Goal: return home PT Goal Formulation: With patient Time For Goal Achievement: 08/06/16 Potential to Achieve Goals: Good Progress towards PT goals: Not progressing toward  goals - comment (pt now with TDWB L LE status)    Frequency    Min 3X/week      PT Plan Current plan remains appropriate;Other (comment) (Goals need to be updated)    Co-evaluation             End of Session Equipment Utilized During Treatment: Gait belt Activity Tolerance: Patient limited by pain;Patient limited by fatigue Patient left: in chair;with call bell/phone within reach     Time: 1503-1529 PT Time Calculation (min) (ACUTE ONLY): 26 min  Charges:  $Therapeutic Activity: 23-37 mins                    G CodesClearnce Sorrel  Rayli Wiederhold 07-Aug-2016, 4:58 PM Sherie Don, [place], DPT [phone]

## 2016-07-30 NOTE — Plan of Care (Signed)
Problem: Safety: Goal: Ability to remain free from injury will improve Outcome: Progressing No fall or injury noted  Problem: Pain Managment: Goal: General experience of comfort will improve Outcome: Progressing Medicated once for pain with full relief  Problem: Physical Regulation: Goal: Will remain free from infection Outcome: Progressing No S/S of infection  Problem: Skin Integrity: Goal: Risk for impaired skin integrity will decrease Outcome: Progressing No other skin issues noted other than woud on left heel  Problem: Nutrition: Goal: Adequate nutrition will be maintained Outcome: Not Progressing Very poor appetite, refused Boost and Ensure  Problem: Bowel/Gastric: Goal: Will not experience complications related to bowel motility Outcome: Progressing No bowel issues noted

## 2016-07-31 DIAGNOSIS — S91302D Unspecified open wound, left foot, subsequent encounter: Secondary | ICD-10-CM

## 2016-07-31 MED ORDER — AMOXICILLIN-POT CLAVULANATE 875-125 MG PO TABS
1.0000 | ORAL_TABLET | Freq: Two times a day (BID) | ORAL | Status: DC
  Administered 2016-07-31 – 2016-08-01 (×2): 1 via ORAL
  Filled 2016-07-31 (×2): qty 1

## 2016-07-31 NOTE — Plan of Care (Signed)
Problem: Activity: Goal: Risk for activity intolerance will decrease Outcome: Progressing Minimal tolerance  Problem: Bowel/Gastric: Goal: Will not experience complications related to bowel motility Outcome: Progressing No bowel issues noted
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
## 2016-08-01 ENCOUNTER — Encounter (HOSPITAL_COMMUNITY): Payer: Self-pay | Admitting: Orthopedic Surgery

## 2016-08-01 LAB — CREATININE, SERUM
Creatinine, Ser: 1.89 mg/dL — ABNORMAL HIGH (ref 0.44–1.00)
GFR, EST AFRICAN AMERICAN: 27 mL/min — AB (ref >60)
GFR, EST NON AFRICAN AMERICAN: 23 mL/min — AB (ref >60)

## 2016-08-01 MED ORDER — AMOXICILLIN-POT CLAVULANATE 875-125 MG PO TABS: 1.0000 | ORAL_TABLET | Freq: Two times a day (BID) | ORAL | 0 refills | Status: AC

## 2016-08-01 MED ORDER — BUPIVACAINE-EPINEPHRINE (PF) 0.5% -1:200000 IJ SOLN
INTRAMUSCULAR | Status: DC | PRN
  Administered 2016-07-29: 25 mL via PERINEURAL

## 2016-08-01 MED ORDER — HYDROCODONE-ACETAMINOPHEN 5-325 MG PO TABS: 1.0000 | ORAL_TABLET | Freq: Four times a day (QID) | ORAL | 0 refills | Status: AC | PRN

## 2016-08-01 MED ORDER — FERROUS SULFATE 325 (65 FE) MG PO TABS: 325.0000 mg | ORAL_TABLET | Freq: Two times a day (BID) | ORAL | 0 refills | Status: DC

## 2016-08-01 NOTE — Anesthesia Postprocedure Evaluation (Signed)
Anesthesia Post Note  Patient: Alexandria Walton  Procedure(s) Performed: Procedure(s) (LRB): Left Partial Calcaneus Excision (Left)  Patient location during evaluation: PACU Anesthesia Type: Regional Level of consciousness: awake Pain management: pain level controlled Vital Signs Assessment: post-procedure vital signs reviewed and stable Respiratory status: spontaneous breathing Cardiovascular status: stable Postop Assessment: no signs of nausea or vomiting Anesthetic complications: no    Last Vitals:  Vitals:   07/31/16 2043 08/01/16 0418  BP: 114/70 130/70  Pulse: 97 87  Resp: 16 16  Temp: 36.9 C 36.8 C    Last Pain:  Vitals:   08/01/16 0418  TempSrc: Oral  PainSc:                  [name]

## 2016-08-01 NOTE — Progress Notes (Signed)
Patient ID: Alexandria Walton, female   DOB: [birth year], 82 y.o.   MRN: [id number] Postoperative day 3 partial calcaneal excision. Patient is resting comfortably this morning. Anticipate discharge to home with home health physical therapy nonweightbearing on the left. Plan follow-up in the office in 1 week.

## 2016-08-01 NOTE — Care Management Important Message (Signed)
Important Message  Patient Details  Name: Alexandria Walton MRN: [id number] Date of Birth: [birth year]   Medicare Important Message Given:  Yes    Ernest Orr 08/01/2016, 11:52 AM
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
## 2016-08-01 NOTE — Anesthesia Procedure Notes (Signed)
Anesthesia Regional Block:  Popliteal block  Pre-Anesthetic Checklist: ,, timeout performed, Correct Patient, Correct Site, Correct Laterality, Correct Procedure, Correct Position, site marked, Risks and benefits discussed,  Surgical consent,  Pre-op evaluation,  At surgeon's request and post-op pain management  Laterality: Left  Prep: chloraprep       Needles:  Injection technique: Single-shot  Needle Type: Echogenic Stimulator Needle     Needle Length:cm 9 cm Needle Gauge: 21 G    Additional Needles:  Procedures: ultrasound guided (picture in chart) Popliteal block Narrative:   Performed by: Personally  Anesthesiologist: Lataya Varnell  Additional Notes: A functioning IV was confirmed and monitors were applied.  Sterile prep and drape, hand hygiene and sterile gloves were used.  Negative aspiration and test dose prior to incremental administration of local anesthetic. The patient tolerated the procedure well.Ultrasound  guidance: relevant anatomy identified, needle position confirmed, local anesthetic spread visualized around nerve(s), vascular puncture avoided.  Image printed for medical record.

## 2016-08-01 NOTE — Plan of Care (Signed)
Problem: Health Behavior/Discharge Planning: Goal: Ability to manage health-related needs will improve Outcome: Completed/Met Date Met: 08/01/16 Home with daughter went over orders with her and rx given

## 2016-08-02 ENCOUNTER — Encounter (INDEPENDENT_AMBULATORY_CARE_PROVIDER_SITE_OTHER): Payer: Self-pay | Admitting: Internal Medicine

## 2016-08-05 ENCOUNTER — Encounter (HOSPITAL_BASED_OUTPATIENT_CLINIC_OR_DEPARTMENT_OTHER): Payer: Medicare Other

## 2016-08-09 ENCOUNTER — Ambulatory Visit (INDEPENDENT_AMBULATORY_CARE_PROVIDER_SITE_OTHER): Payer: Medicare Other | Admitting: Orthopedic Surgery

## 2016-08-09 DIAGNOSIS — M86272 Subacute osteomyelitis, left ankle and foot: Secondary | ICD-10-CM

## 2016-08-09 DIAGNOSIS — L97424 Non-pressure chronic ulcer of left heel and midfoot with necrosis of bone: Secondary | ICD-10-CM

## 2016-08-16 ENCOUNTER — Encounter (HOSPITAL_BASED_OUTPATIENT_CLINIC_OR_DEPARTMENT_OTHER): Payer: Medicare Other | Attending: Internal Medicine

## 2016-08-16 ENCOUNTER — Inpatient Hospital Stay (INDEPENDENT_AMBULATORY_CARE_PROVIDER_SITE_OTHER): Payer: Medicare Other | Admitting: Orthopedic Surgery

## 2016-08-16 ENCOUNTER — Ambulatory Visit (INDEPENDENT_AMBULATORY_CARE_PROVIDER_SITE_OTHER): Payer: Self-pay | Admitting: Orthopedic Surgery

## 2016-08-16 DIAGNOSIS — L97821 Non-pressure chronic ulcer of other part of left lower leg limited to breakdown of skin: Secondary | ICD-10-CM | POA: Insufficient documentation

## 2016-08-16 DIAGNOSIS — I2510 Atherosclerotic heart disease of native coronary artery without angina pectoris: Secondary | ICD-10-CM | POA: Insufficient documentation

## 2016-08-16 DIAGNOSIS — F0390 Unspecified dementia without behavioral disturbance: Secondary | ICD-10-CM | POA: Diagnosis not present

## 2016-08-16 DIAGNOSIS — G4730 Sleep apnea, unspecified: Secondary | ICD-10-CM | POA: Insufficient documentation

## 2016-08-16 DIAGNOSIS — Z87891 Personal history of nicotine dependence: Secondary | ICD-10-CM | POA: Insufficient documentation

## 2016-08-16 DIAGNOSIS — Z86711 Personal history of pulmonary embolism: Secondary | ICD-10-CM | POA: Diagnosis not present

## 2016-08-16 DIAGNOSIS — L89623 Pressure ulcer of left heel, stage 3: Secondary | ICD-10-CM | POA: Diagnosis present

## 2016-08-16 DIAGNOSIS — M1990 Unspecified osteoarthritis, unspecified site: Secondary | ICD-10-CM | POA: Insufficient documentation

## 2016-08-16 DIAGNOSIS — I129 Hypertensive chronic kidney disease with stage 1 through stage 4 chronic kidney disease, or unspecified chronic kidney disease: Secondary | ICD-10-CM | POA: Insufficient documentation

## 2016-08-16 DIAGNOSIS — I714 Abdominal aortic aneurysm, without rupture: Secondary | ICD-10-CM | POA: Insufficient documentation

## 2016-08-16 DIAGNOSIS — N183 Chronic kidney disease, stage 3 (moderate): Secondary | ICD-10-CM | POA: Diagnosis not present

## 2016-08-22 ENCOUNTER — Inpatient Hospital Stay (INDEPENDENT_AMBULATORY_CARE_PROVIDER_SITE_OTHER): Payer: Medicare Other | Admitting: Orthopedic Surgery

## 2016-08-22 DIAGNOSIS — M86272 Subacute osteomyelitis, left ankle and foot: Secondary | ICD-10-CM

## 2016-08-22 DIAGNOSIS — L97424 Non-pressure chronic ulcer of left heel and midfoot with necrosis of bone: Secondary | ICD-10-CM

## 2016-08-23 DIAGNOSIS — L89623 Pressure ulcer of left heel, stage 3: Secondary | ICD-10-CM | POA: Diagnosis not present

## 2016-08-31 DIAGNOSIS — L89623 Pressure ulcer of left heel, stage 3: Secondary | ICD-10-CM | POA: Diagnosis not present

## 2016-09-07 ENCOUNTER — Encounter (HOSPITAL_BASED_OUTPATIENT_CLINIC_OR_DEPARTMENT_OTHER): Payer: Medicare Other | Attending: Surgery

## 2016-09-07 DIAGNOSIS — I10 Essential (primary) hypertension: Secondary | ICD-10-CM | POA: Diagnosis not present

## 2016-09-07 DIAGNOSIS — F0390 Unspecified dementia without behavioral disturbance: Secondary | ICD-10-CM | POA: Insufficient documentation

## 2016-09-07 DIAGNOSIS — Y838 Other surgical procedures as the cause of abnormal reaction of the patient, or of later complication, without mention of misadventure at the time of the procedure: Secondary | ICD-10-CM | POA: Insufficient documentation

## 2016-09-07 DIAGNOSIS — T8131XA Disruption of external operation (surgical) wound, not elsewhere classified, initial encounter: Secondary | ICD-10-CM | POA: Diagnosis not present

## 2016-09-07 DIAGNOSIS — G4730 Sleep apnea, unspecified: Secondary | ICD-10-CM | POA: Insufficient documentation

## 2016-09-07 DIAGNOSIS — I2510 Atherosclerotic heart disease of native coronary artery without angina pectoris: Secondary | ICD-10-CM | POA: Insufficient documentation

## 2016-09-14 DIAGNOSIS — T8131XA Disruption of external operation (surgical) wound, not elsewhere classified, initial encounter: Secondary | ICD-10-CM | POA: Diagnosis not present

## 2016-11-18 ENCOUNTER — Ambulatory Visit: Payer: Medicare Other | Admitting: Podiatry

## 2017-01-03 ENCOUNTER — Emergency Department (HOSPITAL_COMMUNITY)
Admission: EM | Admit: 2017-01-03 | Discharge: 2017-01-03 | Disposition: A | Discharge status: Home / Self Care | Payer: Medicare Other | Attending: Emergency Medicine | Admitting: Emergency Medicine

## 2017-01-03 ENCOUNTER — Encounter (HOSPITAL_COMMUNITY): Payer: Self-pay | Admitting: Emergency Medicine

## 2017-01-03 DIAGNOSIS — N183 Chronic kidney disease, stage 3 (moderate): Secondary | ICD-10-CM | POA: Diagnosis not present

## 2017-01-03 DIAGNOSIS — I2510 Atherosclerotic heart disease of native coronary artery without angina pectoris: Secondary | ICD-10-CM | POA: Diagnosis not present

## 2017-01-03 DIAGNOSIS — R1110 Vomiting, unspecified: Secondary | ICD-10-CM | POA: Insufficient documentation

## 2017-01-03 DIAGNOSIS — Z87891 Personal history of nicotine dependence: Secondary | ICD-10-CM | POA: Diagnosis not present

## 2017-01-03 DIAGNOSIS — Z79899 Other long term (current) drug therapy: Secondary | ICD-10-CM | POA: Insufficient documentation

## 2017-01-03 DIAGNOSIS — I129 Hypertensive chronic kidney disease with stage 1 through stage 4 chronic kidney disease, or unspecified chronic kidney disease: Secondary | ICD-10-CM | POA: Diagnosis not present

## 2017-01-03 DIAGNOSIS — I10 Essential (primary) hypertension: Secondary | ICD-10-CM

## 2017-01-03 LAB — COMPREHENSIVE METABOLIC PANEL
ALBUMIN: 4.5 g/dL (ref 3.5–5.0)
ALT: 17 U/L (ref 14–54)
AST: 31 U/L (ref 15–41)
Alkaline Phosphatase: 54 U/L (ref 38–126)
Anion gap: 13 (ref 5–15)
BILIRUBIN TOTAL: 0.9 mg/dL (ref 0.3–1.2)
BUN: 16 mg/dL (ref 6–20)
CHLORIDE: 99 mmol/L — AB (ref 101–111)
CO2: 25 mmol/L (ref 22–32)
Calcium: 10.1 mg/dL (ref 8.9–10.3)
Creatinine, Ser: 1.26 mg/dL — ABNORMAL HIGH (ref 0.44–1.00)
GFR calc Af Amer: 44 mL/min — ABNORMAL LOW (ref >60)
GFR calc non Af Amer: 38 mL/min — ABNORMAL LOW (ref >60)
GLUCOSE: 129 mg/dL — AB (ref 65–99)
POTASSIUM: 3.8 mmol/L (ref 3.5–5.1)
Sodium: 137 mmol/L (ref 135–145)
Total Protein: 9.1 g/dL — ABNORMAL HIGH (ref 6.5–8.1)

## 2017-01-03 LAB — CBC
HEMATOCRIT: 36.3 % (ref 36.0–46.0)
Hemoglobin: 12 g/dL (ref 12.0–15.0)
MCH: 30.2 pg (ref 26.0–34.0)
MCHC: 33.1 g/dL (ref 30.0–36.0)
MCV: 91.4 fL (ref 78.0–100.0)
Platelets: 207 10*3/uL (ref 150–400)
RBC: 3.97 MIL/uL (ref 3.87–5.11)
RDW: 13.8 % (ref 11.5–15.5)
WBC: 9.1 10*3/uL (ref 4.0–10.5)

## 2017-01-03 LAB — URINALYSIS, ROUTINE W REFLEX MICROSCOPIC
Bilirubin Urine: NEGATIVE
Glucose, UA: 50 mg/dL — AB
KETONES UR: 20 mg/dL — AB
Nitrite: NEGATIVE
PH: 6 (ref 5.0–8.0)
Protein, ur: 100 mg/dL — AB
SPECIFIC GRAVITY, URINE: 1.012 (ref 1.005–1.030)

## 2017-01-03 LAB — I-STAT CG4 LACTIC ACID, ED: Lactic Acid, Venous: 1.76 mmol/L (ref 0.5–1.9)

## 2017-01-03 LAB — I-STAT TROPONIN, ED: TROPONIN I, POC: 0.01 ng/mL (ref 0.00–0.08)

## 2017-01-03 LAB — LIPASE, BLOOD: LIPASE: 29 U/L (ref 11–51)

## 2017-01-03 MED ORDER — ONDANSETRON HCL 4 MG PO TABS: 4.0000 mg | ORAL_TABLET | Freq: Three times a day (TID) | ORAL | 0 refills | Status: DC | PRN

## 2017-01-03 MED ORDER — ONDANSETRON HCL 4 MG/2ML IJ SOLN
4.0000 mg | Freq: Once | INTRAMUSCULAR | Status: AC
  Administered 2017-01-03: 4 mg via INTRAVENOUS
  Filled 2017-01-03: qty 2

## 2017-01-03 MED ORDER — METOPROLOL SUCCINATE ER 100 MG PO TB24
100.0000 mg | ORAL_TABLET | Freq: Every day | ORAL | Status: DC
  Administered 2017-01-03: 100 mg via ORAL
  Filled 2017-01-03: qty 1

## 2017-01-03 MED ORDER — SODIUM CHLORIDE 0.9 % IV BOLUS (SEPSIS)
500.0000 mL | Freq: Once | INTRAVENOUS | Status: AC
  Administered 2017-01-03: 500 mL via INTRAVENOUS

## 2017-01-03 MED ORDER — BENAZEPRIL HCL 20 MG PO TABS
20.0000 mg | ORAL_TABLET | Freq: Two times a day (BID) | ORAL | Status: DC
  Administered 2017-01-03: 20 mg via ORAL
  Filled 2017-01-03 (×2): qty 1

## 2017-01-03 NOTE — ED Provider Notes (Signed)
[facility] DEPT Provider Note   CSN: [id number] Arrival date & time: 01/03/17  1455     History   Chief Complaint Chief Complaint  Patient presents with  . Hypertension  . Emesis    HPI Alexandria Walton is a 83 y.o. female.with history of dementia and hypertension that presents to the ED for vomiting. Daughter was at the bedside and helped provide the history.  Patient started vomiting last night after taking donozapil.  Donozapil was discontinued in April 2017 due to side effects of nausea/vomiting with little benefit from use, however, patient thought she was supposed to take it and did so last night for the first time in almost a year.  Patient has vomited 6-7 times, last time being around 6pm today.  Patient has not been able to take her blood pressure medications due to n/v.  Patient denies SOB, chest pain, abdominal pain, dysuria or leg swelling.  [name] daughter called PCP to discuss Donozapil and patient was advised to not take medication again.  HPI  Past Medical History:  Diagnosis Date  . Abdominal aneurysm without mention of rupture   . Abdominal pain, unspecified site   . Acute bronchitis   . Allergic rhinitis due to pollen   . CAD (coronary artery disease)   . Chronic kidney disease, stage II (mild)   . Essential hypertension, malignant   . GERD (gastroesophageal reflux disease)   . Headache(784.0)   . High cholesterol   . Memory loss 01/30/2013  . OSA (obstructive sleep apnea)    mod OSA '07  . Osteoarthrosis, unspecified whether generalized or localized, unspecified site   . PE (pulmonary embolism)    2005  . Pure hypercholesterolemia     Patient Active Problem List   Diagnosis Date Noted  . Unspecified open wound, left foot, subsequent encounter   . AKI (acute kidney injury) ([place])   . Protein-calorie malnutrition, severe 07/26/2016  . Left foot infection 07/25/2016  . Cellulitis of left foot 07/25/2016  . Dementia 07/25/2016  . DNR (do not  resuscitate) 07/25/2016  . Normocytic anemia 07/25/2016  . Dissection of aorta, thoracic ([place]) 02/22/2016  . Hypertension 02/22/2016  . Tibial plateau fracture, left 02/22/2016  . Chronic kidney disease, stage 3 02/22/2016  . Left tibial fracture   . Memory loss 01/30/2013  . Unspecified sleep apnea 12/25/2012  . Headache(784.0) 12/25/2012    Past Surgical History:  Procedure Laterality Date  . CATARACT EXTRACTION W/PHACO  03/14/2012   Procedure: CATARACT EXTRACTION PHACO AND INTRAOCULAR LENS PLACEMENT (IOC);  Surgeon: Marylynn Pearson, MD;  Location: [place] [place];  Service: Ophthalmology;  Laterality: Left;  . EYE SURGERY  2010   cat ext right  . I&D EXTREMITY Left 07/29/2016   Procedure: Left Partial Calcaneus Excision;  Surgeon: Newt Minion, MD;  Location: [facility];  Service: Orthopedics;  Laterality: Left;  Marland Kitchen VASCULAR SURGERY  95? 97?   AAA    OB History    No data available       Home Medications    Prior to Admission medications   Medication Sig Start Date End Date Taking? Authorizing Provider  acetaminophen (TYLENOL) 500 MG tablet Take 1,000 mg by mouth every 6 (six) hours as needed for mild pain or fever.    Historical Provider, MD  amLODipine (NORVASC) 5 MG tablet Take 1 tablet (5 mg total) by mouth daily. Patient not taking: Reported on 07/25/2016 02/26/16   Cherene Altes, MD  Ascorbic Acid (VITAMIN C PO) Take  1 tablet by mouth daily.    Historical Provider, MD  benazepril (LOTENSIN) 20 MG tablet Take 20 mg by mouth 2 (two) times daily. 05/06/16   Historical Provider, MD  bisacodyl (DULCOLAX) 5 MG EC tablet Take 1 tablet (5 mg total) by mouth daily as needed for moderate constipation. Patient not taking: Reported on 07/25/2016 02/26/16   Cherene Altes, MD  ferrous sulfate 325 (65 FE) MG tablet Take 1 tablet (325 mg total) by mouth 2 (two) times daily with a meal. 08/01/16   Doreatha Lew, MD  metoprolol succinate (TOPROL-XL) 50 MG 24 hr tablet Take 50 mg by mouth daily.  Take with or immediately following a meal.    Historical Provider, MD  MYRBETRIQ 50 MG TB24 tablet Take 50 mg by mouth daily. 02/05/16   Historical Provider, MD  ondansetron (ZOFRAN) 4 MG tablet Take 1 tablet (4 mg total) by mouth every 8 (eight) hours as needed for nausea or vomiting. 01/03/17   Valinda Party, DO  polyethylene glycol (MIRALAX / GLYCOLAX) packet Take 17 g by mouth daily as needed for mild constipation. Patient not taking: Reported on 07/25/2016 02/26/16   Cherene Altes, MD  simvastatin (ZOCOR) 40 MG tablet Take 40 mg by mouth at bedtime. 05/17/16   Historical Provider, MD  traMADol (ULTRAM) 50 MG tablet Take 1 tablet (50 mg total) by mouth every 8 (eight) hours as needed for severe pain. Patient not taking: Reported on 07/25/2016 02/26/16   Cherene Altes, MD    Family History Family History  Problem Relation Age of Onset  . Anesthesia problems Neg Hx     Social History Social History  Substance Use Topics  . Smoking status: Former Smoker    Quit date: 10/20/1993  . Smokeless tobacco: Never Used  . Alcohol use No     Allergies   Codeine and Morphine and related   Review of Systems Review of Systems  Constitutional: Negative for fever.  Respiratory: Negative for shortness of breath.   Cardiovascular: Negative for chest pain and leg swelling.  Gastrointestinal: Negative for abdominal pain.  Neurological: Positive for headaches.     Physical Exam Updated Vital Signs BP 154/99   Pulse 93   Temp 99.5 F (37.5 C) (Rectal)   Resp 19   Ht 5\' 7"  (1.702 m)   Wt 61.2 kg   SpO2 95%   BMI 21.14 kg/m   Physical Exam  Cardiovascular: Regular rhythm and normal heart sounds.  Exam reveals no gallop and no friction rub.   No murmur heard. Tachycardic  Pulmonary/Chest: Effort normal and breath sounds normal. No respiratory distress. She has no wheezes. She has no rales.  Abdominal: Soft. Bowel sounds are normal. She exhibits no distension and no mass.  There is no tenderness. There is no rebound and no guarding.  Musculoskeletal: She exhibits no edema.  Skin: Skin is warm and dry.     ED Treatments / Results  Labs (all labs ordered are listed, but only abnormal results are displayed) Labs Reviewed  COMPREHENSIVE METABOLIC PANEL - Abnormal; Notable for the following:       Result Value   Chloride 99 (*)    Glucose, Bld 129 (*)    Creatinine, Ser 1.26 (*)    Total Protein 9.1 (*)    GFR calc non Af Amer 38 (*)    GFR calc Af Amer 44 (*)    All other components within normal limits  URINALYSIS, ROUTINE W REFLEX  MICROSCOPIC - Abnormal; Notable for the following:    APPearance HAZY (*)    Glucose, UA 50 (*)    Hgb urine dipstick MODERATE (*)    Ketones, ur 20 (*)    Protein, ur 100 (*)    Leukocytes, UA SMALL (*)    Bacteria, UA RARE (*)    Squamous Epithelial / LPF 6-30 (*)    All other components within normal limits  LIPASE, BLOOD  CBC  I-STAT CG4 LACTIC ACID, ED  I-STAT CG4 LACTIC ACID, ED  I-STAT TROPOININ, ED    EKG  EKG Interpretation  Date/Time:  Tuesday January 03 2017 19:12:20 EST Ventricular Rate:  94 PR Interval:    QRS Duration: 104 QT Interval:  392 QTC Calculation: 491 R Axis:   -12 Text Interpretation:  Sinus rhythm Ventricular premature complex Consider left atrial enlargement Left ventricular hypertrophy Anterior Q waves, possibly due to LVH No significant change since last tracing Confirmed by Maryan Rued  MD, Loree Fee ([id number]) on 01/03/2017 7:37:12 PM Also confirmed by Maryan Rued  MD, [facility] ([id number]), editor [place] [address], Leda Gauze [phone])  on 01/04/2017 7:26:18 AM       Radiology No results found.  Procedures Procedures (including critical care time)  Medications Ordered in ED Medications  ondansetron (ZOFRAN) injection 4 mg (4 mg Intravenous Given 01/03/17 1822)  sodium chloride 0.9 % bolus 500 mL (0 mLs Intravenous Stopped 01/03/17 2017)     Initial Impression / Assessment and Plan / ED Course  I  have reviewed the triage vital signs and the nursing notes.  Pertinent labs & imaging results that were available during my care of the patient were reviewed by me and considered in my medical decision making (see chart for details).   Patient presents to the ED for vomiting that started last night.  Per daughter patient took donozapil for the first time in almost a year.  Patient is afebrile with no leukocytosis. Lactic acid, liver function and lipase is normal.  Not concerned for an infectious process at this time.  EKG with no ST elevations or T wave inversions.  Troponin was negative.  Patient received IV zofran, fluids and her blood pressure medications.  Prior to discharge patients blood pressure had improved to 160/90 from 183/102.  Patient was able to eat and drink without issues.  Patient was discharged with zofran and told to follow up with PCP after discharge.    Final Clinical Impressions(s) / ED Diagnoses   Final diagnoses:  Vomiting, intractability of vomiting not specified, presence of nausea not specified, unspecified vomiting type  Hypertension, unspecified type    New Prescriptions Discharge Medication List as of 01/03/2017  9:25 PM       Valinda Party, DO 01/04/17 [place], MD 01/06/17 1320

## 2017-01-03 NOTE — ED Notes (Signed)
Pt stable, states understanding of discharge instructions, daughter at bedside.

## 2017-01-03 NOTE — Discharge Instructions (Signed)
Alexandria Walton,  Please take zofran as needed for your vomiting. Please follow up with your primary care doctor after discharge

## 2017-01-03 NOTE — ED Notes (Signed)
Resident cancelled urinalysis already in progress.  Pt discharged, advised resident Janett Billow and Dr. Maryan Rued that urine looks to be UTI +.

## 2017-01-03 NOTE — ED Triage Notes (Signed)
Pt from home with daughter who states pt started a BP medication again that has made causes emesis since last night.  Daughter reports hx of the same with this BP medication and that she wasn't suppose to be taking it.  Pt also was to start a new BP medication today but has not had anything due to emesis and questions about medications.  NAD, A&O.

## 2017-01-25 ENCOUNTER — Ambulatory Visit: Payer: Medicare Other | Admitting: Podiatry

## 2017-02-03 ENCOUNTER — Encounter: Payer: Self-pay | Admitting: Podiatry

## 2017-02-03 ENCOUNTER — Ambulatory Visit (INDEPENDENT_AMBULATORY_CARE_PROVIDER_SITE_OTHER): Payer: Medicare Other | Admitting: Podiatry

## 2017-02-03 VITALS — BP 154/89 | HR 71

## 2017-02-03 DIAGNOSIS — B351 Tinea unguium: Secondary | ICD-10-CM

## 2017-02-03 DIAGNOSIS — M79676 Pain in unspecified toe(s): Secondary | ICD-10-CM | POA: Diagnosis not present
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
## 2017-05-12 ENCOUNTER — Ambulatory Visit: Payer: Medicare Other | Admitting: Podiatry

## 2017-06-21 ENCOUNTER — Ambulatory Visit: Payer: Medicare Other | Admitting: Podiatry

## 2017-12-15 ENCOUNTER — Encounter: Payer: Self-pay | Admitting: Podiatry

## 2017-12-15 ENCOUNTER — Ambulatory Visit: Payer: Medicare Other | Admitting: Podiatry

## 2017-12-15 DIAGNOSIS — M79676 Pain in unspecified toe(s): Secondary | ICD-10-CM

## 2017-12-15 DIAGNOSIS — B351 Tinea unguium: Secondary | ICD-10-CM | POA: Diagnosis not present
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
## 2018-01-31 ENCOUNTER — Emergency Department (HOSPITAL_COMMUNITY): Payer: Medicare Other

## 2018-01-31 ENCOUNTER — Encounter (HOSPITAL_COMMUNITY): Payer: Self-pay | Admitting: Emergency Medicine

## 2018-01-31 DIAGNOSIS — I129 Hypertensive chronic kidney disease with stage 1 through stage 4 chronic kidney disease, or unspecified chronic kidney disease: Secondary | ICD-10-CM | POA: Insufficient documentation

## 2018-01-31 DIAGNOSIS — Z79899 Other long term (current) drug therapy: Secondary | ICD-10-CM | POA: Diagnosis not present

## 2018-01-31 DIAGNOSIS — Z87891 Personal history of nicotine dependence: Secondary | ICD-10-CM | POA: Diagnosis not present

## 2018-01-31 DIAGNOSIS — I7101 Dissection of thoracic aorta: Secondary | ICD-10-CM | POA: Diagnosis not present

## 2018-01-31 DIAGNOSIS — J9801 Acute bronchospasm: Secondary | ICD-10-CM | POA: Diagnosis not present

## 2018-01-31 DIAGNOSIS — N182 Chronic kidney disease, stage 2 (mild): Secondary | ICD-10-CM | POA: Insufficient documentation

## 2018-01-31 DIAGNOSIS — I2510 Atherosclerotic heart disease of native coronary artery without angina pectoris: Secondary | ICD-10-CM | POA: Insufficient documentation

## 2018-01-31 DIAGNOSIS — R0602 Shortness of breath: Secondary | ICD-10-CM | POA: Diagnosis present

## 2018-01-31 LAB — BASIC METABOLIC PANEL
Anion gap: 11 (ref 5–15)
BUN: 19 mg/dL (ref 6–20)
CALCIUM: 9.9 mg/dL (ref 8.9–10.3)
CHLORIDE: 103 mmol/L (ref 101–111)
CO2: 26 mmol/L (ref 22–32)
Creatinine, Ser: 1.49 mg/dL — ABNORMAL HIGH (ref 0.44–1.00)
GFR calc Af Amer: 35 mL/min — ABNORMAL LOW (ref >60)
GFR calc non Af Amer: 31 mL/min — ABNORMAL LOW (ref >60)
GLUCOSE: 98 mg/dL (ref 65–99)
Potassium: 4.3 mmol/L (ref 3.5–5.1)
Sodium: 140 mmol/L (ref 135–145)

## 2018-01-31 LAB — CBC
HCT: 36.5 % (ref 36.0–46.0)
Hemoglobin: 11.2 g/dL — ABNORMAL LOW (ref 12.0–15.0)
MCH: 29.3 pg (ref 26.0–34.0)
MCHC: 30.7 g/dL (ref 30.0–36.0)
MCV: 95.5 fL (ref 78.0–100.0)
PLATELETS: 224 10*3/uL (ref 150–400)
RBC: 3.82 MIL/uL — ABNORMAL LOW (ref 3.87–5.11)
RDW: 14.6 % (ref 11.5–15.5)
WBC: 8.8 10*3/uL (ref 4.0–10.5)

## 2018-01-31 LAB — I-STAT TROPONIN, ED: Troponin i, poc: 0 ng/mL (ref 0.00–0.08)

## 2018-01-31 MED ORDER — ALBUTEROL SULFATE (2.5 MG/3ML) 0.083% IN NEBU
5.0000 mg | INHALATION_SOLUTION | Freq: Once | RESPIRATORY_TRACT | Status: AC
  Administered 2018-01-31: 5 mg via RESPIRATORY_TRACT
  Filled 2018-01-31: qty 6

## 2018-01-31 NOTE — ED Notes (Signed)
Reports feeling better after breathing treatment.  Noted to be less labored.

## 2018-01-31 NOTE — ED Triage Notes (Signed)
Daughter reports sudden onset of SOB tonight while watching tv.  Noted to be labored with clear breath sounds in triage.

## 2018-02-01 ENCOUNTER — Telehealth: Payer: Self-pay | Admitting: *Deleted

## 2018-02-01 ENCOUNTER — Emergency Department (HOSPITAL_COMMUNITY)
Admission: EM | Admit: 2018-02-01 | Discharge: 2018-02-01 | Disposition: A | Discharge status: Home / Self Care | Payer: Medicare Other | Attending: Emergency Medicine | Admitting: Emergency Medicine

## 2018-02-01 DIAGNOSIS — I71019 Dissection of thoracic aorta, unspecified: Secondary | ICD-10-CM

## 2018-02-01 DIAGNOSIS — J9801 Acute bronchospasm: Secondary | ICD-10-CM

## 2018-02-01 DIAGNOSIS — I7101 Dissection of thoracic aorta: Secondary | ICD-10-CM

## 2018-02-01 DIAGNOSIS — R0602 Shortness of breath: Secondary | ICD-10-CM

## 2018-02-01 DIAGNOSIS — I10 Essential (primary) hypertension: Secondary | ICD-10-CM

## 2018-02-01 LAB — URINALYSIS, ROUTINE W REFLEX MICROSCOPIC
Bilirubin Urine: NEGATIVE
GLUCOSE, UA: NEGATIVE mg/dL
Hgb urine dipstick: NEGATIVE
KETONES UR: NEGATIVE mg/dL
LEUKOCYTES UA: NEGATIVE
Nitrite: NEGATIVE
PH: 8 (ref 5.0–8.0)
PROTEIN: NEGATIVE mg/dL
Specific Gravity, Urine: 1.006 (ref 1.005–1.030)

## 2018-02-01 MED ORDER — HYDRALAZINE HCL 20 MG/ML IJ SOLN
5.0000 mg | Freq: Once | INTRAMUSCULAR | Status: AC
  Administered 2018-02-01: 5 mg via INTRAVENOUS
  Filled 2018-02-01: qty 1

## 2018-02-01 MED ORDER — AMLODIPINE BESYLATE 5 MG PO TABS: 5.0000 mg | ORAL_TABLET | Freq: Every day | ORAL | 0 refills | Status: DC

## 2018-02-01 MED ORDER — HYDRALAZINE HCL 20 MG/ML IJ SOLN
10.0000 mg | Freq: Once | INTRAMUSCULAR | Status: AC
  Administered 2018-02-01: 10 mg via INTRAVENOUS
  Filled 2018-02-01: qty 1

## 2018-02-01 MED ORDER — AMLODIPINE BESYLATE 5 MG PO TABS
5.0000 mg | ORAL_TABLET | Freq: Once | ORAL | Status: AC
  Administered 2018-02-01: 5 mg via ORAL
  Filled 2018-02-01: qty 1

## 2018-02-01 NOTE — ED Provider Notes (Signed)
TIME SEEN: 12:47 AM  CHIEF COMPLAINT: Short of breath  HPI: Patient is an 84 year old female with history of CAD, hypertension, hyperlipidemia, chronic kidney disease, pulmonary embolus in 2005, thoracic aortic dissection since 2017 who presents to the emergency department shortness of breath that started while in bed tonight around 9:30 PM.  Patient reports that she felt like she was breathing heavily and daughter reports she was tachypneic and wheezing.  Patient felt like her heart was beating fast but she had no chest pain or chest discomfort.  No recent fever or cough.  No lower extremity swelling or pain.  Has never had asthma or COPD before.  Does not use breathing treatments regularly.  Does not wear oxygen at home.  Was given a breathing treatment in triage and states she is now completely asymptomatic.  Of note patient was very hypertensive on arrival.  This is slowly improved.  She is on metoprolol and benazepril and daughter reports compliance.  Daughter reports her blood pressure is normally in the 160s/70s at home.  ROS: See HPI Constitutional: no fever  Eyes: no drainage  ENT: no runny nose   Cardiovascular:  no chest pain  Resp:  SOB  GI: no vomiting GU: no dysuria Integumentary: no rash  Allergy: no hives  Musculoskeletal: no leg swelling  Neurological: no slurred speech ROS otherwise negative  PAST MEDICAL HISTORY/PAST SURGICAL HISTORY:  Past Medical History:  Diagnosis Date  . Abdominal aneurysm without mention of rupture   . Abdominal pain, unspecified site   . Acute bronchitis   . Allergic rhinitis due to pollen   . CAD (coronary artery disease)   . Chronic kidney disease, stage II (mild)   . Essential hypertension, malignant   . GERD (gastroesophageal reflux disease)   . Headache(784.0)   . High cholesterol   . Memory loss 01/30/2013  . OSA (obstructive sleep apnea)    mod OSA '07  . Osteoarthrosis, unspecified whether generalized or localized, unspecified  site   . PE (pulmonary embolism)    2005  . Pure hypercholesterolemia     MEDICATIONS:  Prior to Admission medications   Medication Sig Start Date End Date Taking? Authorizing Provider  acetaminophen (TYLENOL) 500 MG tablet Take 1,000 mg by mouth every 6 (six) hours as needed for mild pain or fever.    [provider]  amLODipine (NORVASC) 5 MG tablet Take 1 tablet (5 mg total) by mouth daily. Patient not taking: Reported on 07/25/2016 02/26/16   Cherene Altes, MD  Ascorbic Acid (VITAMIN C PO) Take 1 tablet by mouth daily.    [provider]  benazepril (LOTENSIN) 20 MG tablet Take 20 mg by mouth 2 (two) times daily. 05/06/16   [provider]  bisacodyl (DULCOLAX) 5 MG EC tablet Take 1 tablet (5 mg total) by mouth daily as needed for moderate constipation. Patient not taking: Reported on 07/25/2016 02/26/16   Cherene Altes, MD  ferrous sulfate 325 (65 FE) MG tablet Take 1 tablet (325 mg total) by mouth 2 (two) times daily with a meal. 08/01/16   Patrecia Pour, Christean Grief, MD  metoprolol succinate (TOPROL-XL) 50 MG 24 hr tablet Take 50 mg by mouth daily. Take with or immediately following a meal.    [provider]  MYRBETRIQ 50 MG TB24 tablet Take 50 mg by mouth daily. 02/05/16   [provider]  ondansetron (ZOFRAN) 4 MG tablet Take 1 tablet (4 mg total) by mouth every 8 (eight) hours as needed  for nausea or vomiting. 01/03/17   Kalman Shan Ratliff, DO  polyethylene glycol (MIRALAX / GLYCOLAX) packet Take 17 g by mouth daily as needed for mild constipation. Patient not taking: Reported on 07/25/2016 02/26/16   Cherene Altes, MD  simvastatin (ZOCOR) 40 MG tablet Take 40 mg by mouth at bedtime. 05/17/16   [provider]  traMADol (ULTRAM) 50 MG tablet Take 1 tablet (50 mg total) by mouth every 8 (eight) hours as needed for severe pain. Patient not taking: Reported on 07/25/2016 02/26/16   Cherene Altes, MD    ALLERGIES:   Allergies  Allergen Reactions  . Codeine Nausea Only  . Morphine And Related Nausea Only    SOCIAL HISTORY:  Social History   Tobacco Use  . Smoking status: Former Smoker    Last attempt to quit: 10/20/1993    Years since quitting: 24.3  . Smokeless tobacco: Never Used  Substance Use Topics  . Alcohol use: No    FAMILY HISTORY: Family History  Problem Relation Age of Onset  . Anesthesia problems Neg Hx     EXAM: BP (!) 167/87   Pulse 78   Temp 98.2 F (36.8 C) (Oral)   Resp (!) 21   Ht 5\' 7"  (1.702 m)   Wt 59 kg (130 lb)   SpO2 97%   BMI 20.36 kg/m  CONSTITUTIONAL: Alert and oriented and responds appropriately to questions. Well-appearing; well-nourished, elderly HEAD: Normocephalic EYES: Conjunctivae clear, pupils appear equal, EOMI ENT: normal nose; moist mucous membranes NECK: Supple, no meningismus, no nuchal rigidity, no LAD  CARD: RRR; S1 and S2 appreciated; no murmurs, no clicks, no rubs, no gallops RESP: Normal chest excursion without splinting or tachypnea; breath sounds clear and equal bilaterally; no wheezes, no rhonchi, no rales, no hypoxia or respiratory distress, speaking full sentences ABD/GI: Normal bowel sounds; non-distended; soft, non-tender, no rebound, no guarding, no peritoneal signs, no hepatosplenomegaly BACK:  The back appears normal and is non-tender to palpation, there is no CVA tenderness EXT: Normal ROM in all joints; non-tender to palpation; no edema; normal capillary refill; no cyanosis, no calf tenderness or swelling, 2+ radial and DP pulses bilaterally    SKIN: Normal color for age and race; warm; no rash NEURO: Moves all extremities equally PSYCH: The patient's mood and manner are appropriate. Grooming and personal hygiene are appropriate.  MEDICAL DECISION MAKING: Patient here with complaints of shortness of breath.  Sounds like this was mostly bronchospasm which may be related to viral upper restaurant infection versus allergies.   She has no history of asthma or COPD.  Does not use breathing treatments regularly.  I do not feel she would necessary benefit from steroids.  Do not think this is ACS.  Her troponin is negative.  Nothing to suggest pneumonia.  She does not appear volume overloaded.  Doubt PE given now asymptomatic.   Chest x-ray shows that she has evidence of a progressed type a aortic dissection since 2017.  The false lumen and mural hematoma are now 2.6 cm in thickness when they were 1 cm in 2017.  I have had a very lengthy discussion with patient's daughter at bedside who is patient's power of attorney.  She understands that in order to investigate this further we would need to do a CT angios of patient's chest and abdomen.  There are some risk with this study given patient has history of chronic kidney disease.  Her creatinine today is stable with GFR 35.  It  appears in 2017 patient was seen by Dr. Servando Snare with cardiothoracic surgery and was deemed not a surgical candidate.  Daughter reports they recommended tight blood pressure control.  Patient was previously on amlodipine but was taken off of this by her cardiologist.  Blood pressures have been elevated at home in the 160s/70s.  Daughter states that she does not want the patient to have surgery for this dissection.  She understands that this can be a life-threatening process especially given it is increasing in size.  Given cardiothoracic surgery's recommendations 2 years ago for medical management only, I feel it is unlikely that this recommendation would change given patient is now 47 years older and still has the same chronic medical conditions.  Daughter agrees.  Since she would not want any intervention for this dissection we have decided to hold off on further imaging as we would not do anything with these results and IV contrast could worsen her kidney function leading to potential need for dialysis.  Daughter is comfortable with this plan.  She has agreed however  to allow me to work on blood pressure control.  Her blood pressure did drop to 150/88 but is now back in the 190/90.  Will give amlodipine and IV hydralazine.   ED PROGRESS: Patient still hypertensive.  Will give second dose of IV hydralazine.   [name] blood pressure has improved.  It is now 131/77.  She has no acute complaints.  She has been urinating frequently here and we checked a urinalysis which showed no sign of infection and no signs of endorgan damage.  I will restart her amlodipine and have her continue benazepril and metoprolol.  Again patient and daughter do not want further workup for this aortic dissection.  They can follow-up cardiothoracic surgery as needed.  They understand that this can worsen if her blood pressure is not well controlled and can be life-threatening.  Daughter reports that she does not feel patient would want intervention and therefore we will not do any imaging as this would not change patient's plan of care and could put her at risk for worsening renal failure if we give her IV contrast.  We discussed at length return precautions.  Patient and daughter are comfortable with this plan.   I have reviewed and discussed all results (EKG, imaging, lab, urine as appropriate) and exam findings with patient/family. I have reviewed nursing notes and appropriate previous records.  I feel the patient can be discharged home without further emergent workup and can continue workup as an outpatient as needed. Discussed usual and customary return precautions. Patient/family verbalize understanding and are comfortable with this plan.  Outpatient follow-up has been provided if needed. All questions have been answered.      EKG Interpretation  Date/Time:  Wednesday January 31 2018 22:26:23 EDT Ventricular Rate:  87 PR Interval:  214 QRS Duration: 92 QT Interval:  360 QTC Calculation: 433 R Axis:   12 Text Interpretation:  Sinus rhythm with sinus arrhythmia with 1st degree A-V  block Incomplete right bundle branch block Left ventricular hypertrophy with repolarization abnormality Abnormal ECG No significant change since last tracing Confirmed by Lennell Shanks, Cyril Mourning [id number]) on 02/01/2018 12:47:07 AM         Debanhi Blaker, Delice Bison, DO 02/01/18 [id number]

## 2018-02-01 NOTE — ED Notes (Signed)
ED Provider at bedside. 

## 2018-02-01 NOTE — ED Notes (Signed)
RN attempted IV x 2; 2nd RN to start IV-Monique,RN

## 2018-02-01 NOTE — Telephone Encounter (Signed)
Pharmacy called related to Rx: Norvasc drug interaction with Zorcor .Marland KitchenMarland KitchenEDCM clarified with EDP ([place]) to d/c and follow-up with PCP.  Rasheem Figiel J. Clydene Laming, [place], [place], [place]

## 2018-02-01 NOTE — Discharge Instructions (Signed)
You were seen in the emergency department for shortness of breath.  Your shortness of breath improved after breathing treatment.  Your labs were reassuring.  Your chest x-ray showed worsening of your thoracic aortic dissection.  We have discussed further evaluation and management with you and your family and we have decided collectively to avoid further imaging and cardiothoracic involvement at this time given they recommended medical management only in 2017 and have decided that surgery would not be in your best interest at this time.  If you have chest pain, back pain or any worsening of symptoms or change your mind, please return to the emergency department immediately.  I do recommend very tight blood pressure control as this can help prevent this aortic dissection from worsening.  If this dissection worsens it can be life-threatening to you.  Please follow-up closely with your primary care physician and cardiologist.

## 2018-03-16 ENCOUNTER — Ambulatory Visit: Payer: Medicare HMO | Admitting: Podiatry

## 2018-03-16 ENCOUNTER — Encounter: Payer: Self-pay | Admitting: Podiatry

## 2018-03-16 DIAGNOSIS — M79676 Pain in unspecified toe(s): Secondary | ICD-10-CM

## 2018-03-16 DIAGNOSIS — B351 Tinea unguium: Secondary | ICD-10-CM

## 2018-03-16 NOTE — Progress Notes (Addendum)
Complaint:  Visit Type: Patient returns to my office for continued preventative foot care services. Complaint: Patient states" my nails have grown long and thick and become painful to walk and wear shoes"  The patient presents for preventative foot care services. No changes to ROS  Podiatric Exam: Vascular: dorsalis pedis and posterior tibial pulses are palpable bilateral. Capillary return is immediate. Temperature gradient is WNL. Skin turgor WNL  Sensorium: Normal Semmes Weinstein monofilament test. Normal tactile sensation bilaterally. Nail Exam: Pt has thick disfigured discolored nails with subungual debris noted bilateral entire nail hallux through fifth toenails Ulcer Exam: There is no evidence of ulcer or pre-ulcerative changes or infection. Orthopedic Exam: Muscle tone and strength are WNL. No limitations in general ROM. No crepitus or effusions noted. Foot type and digits show no abnormalities. Bony prominences are unremarkable. Skin: No Porokeratosis. No infection or ulcers  Diagnosis:  Onychomycosis, , Pain in right toe, pain in left toes  Treatment & Plan Procedures and Treatment: Consent by patient was obtained for treatment procedures.   Debridement of mycotic and hypertrophic toenails, 1 through 5 bilateral and clearing of subungual debris. No ulceration, no infection noted.  Return Visit-Office Procedure: Patient instructed to return to the office for a follow up visit 10 weeks  for continued evaluation and treatment.    Boston Cookson DPM 

## 2018-05-18 ENCOUNTER — Ambulatory Visit: Payer: Medicare HMO | Admitting: Podiatry

## 2018-07-14 ENCOUNTER — Ambulatory Visit (HOSPITAL_COMMUNITY)
Admission: EM | Admit: 2018-07-14 | Discharge: 2018-07-14 | Disposition: A | Discharge status: Home / Self Care | Payer: Medicare HMO | Attending: Family Medicine | Admitting: Family Medicine

## 2018-07-14 ENCOUNTER — Other Ambulatory Visit: Payer: Self-pay

## 2018-07-14 ENCOUNTER — Ambulatory Visit (INDEPENDENT_AMBULATORY_CARE_PROVIDER_SITE_OTHER): Payer: Medicare HMO

## 2018-07-14 ENCOUNTER — Ambulatory Visit (HOSPITAL_COMMUNITY): Payer: Medicare HMO

## 2018-07-14 ENCOUNTER — Encounter (HOSPITAL_COMMUNITY): Payer: Self-pay | Admitting: Emergency Medicine

## 2018-07-14 DIAGNOSIS — C5701 Malignant neoplasm of right fallopian tube: Secondary | ICD-10-CM

## 2018-07-14 DIAGNOSIS — S7001XA Contusion of right hip, initial encounter: Secondary | ICD-10-CM

## 2018-07-14 DIAGNOSIS — Z23 Encounter for immunization: Secondary | ICD-10-CM

## 2018-07-14 DIAGNOSIS — S32591A Other specified fracture of right pubis, initial encounter for closed fracture: Secondary | ICD-10-CM

## 2018-07-14 DIAGNOSIS — S5011XA Contusion of right forearm, initial encounter: Secondary | ICD-10-CM | POA: Diagnosis not present

## 2018-07-14 DIAGNOSIS — W19XXXA Unspecified fall, initial encounter: Secondary | ICD-10-CM | POA: Diagnosis not present

## 2018-07-14 DIAGNOSIS — M25551 Pain in right hip: Secondary | ICD-10-CM | POA: Diagnosis not present

## 2018-07-14 DIAGNOSIS — S32511A Fracture of superior rim of right pubis, initial encounter for closed fracture: Secondary | ICD-10-CM

## 2018-07-14 DIAGNOSIS — S32592A Other specified fracture of left pubis, initial encounter for closed fracture: Secondary | ICD-10-CM

## 2018-07-14 MED ORDER — TETANUS-DIPHTH-ACELL PERTUSSIS 5-2.5-18.5 LF-MCG/0.5 IM SUSP
INTRAMUSCULAR | Status: AC
  Filled 2018-07-14: qty 0.5

## 2018-07-14 MED ORDER — TRAMADOL HCL 50 MG PO TABS: 50.0000 mg | ORAL_TABLET | Freq: Three times a day (TID) | ORAL | 0 refills | Status: DC | PRN

## 2018-07-14 MED ORDER — ACETAMINOPHEN 325 MG PO TABS
ORAL_TABLET | ORAL | Status: AC
  Filled 2018-07-14: qty 2

## 2018-07-14 MED ORDER — TETANUS-DIPHTH-ACELL PERTUSSIS 5-2.5-18.5 LF-MCG/0.5 IM SUSP
0.5000 mL | Freq: Once | INTRAMUSCULAR | Status: AC
  Administered 2018-07-14: 0.5 mL via INTRAMUSCULAR

## 2018-07-14 MED ORDER — ACETAMINOPHEN 325 MG PO TABS
650.0000 mg | ORAL_TABLET | Freq: Once | ORAL | Status: AC
  Administered 2018-07-14: 650 mg via ORAL

## 2018-07-14 NOTE — ED Triage Notes (Signed)
Seen by provider prior to nursing staff

## 2018-07-14 NOTE — ED Provider Notes (Signed)
[facility]    CSN: [id number] Arrival date & time: 07/14/18  1737     History   Chief Complaint Chief Complaint  Patient presents with  . Fall    HPI Alexandria Walton is a 84 y.o. female.   HPI  Patient was out of town with her daughter.  She was in a room with a toilet seat that was much lower than her usual.  As she was trying to stand up her strength gave out and she fell.  She landed over on her right side.  She is a bruise on her right hip.  She has pain with what weightbearing.  She can put partial weightbearing on it, she is walking with a walker.  She is walking slowly. I explained to the woman and her daughter that hip fracture years ago the emergency department.  They are insistent upon being seen here.  They do understand that if she has a fracture she will have to go down to the ED. She also has a bruise on her forearm right forearm.  Is a hematoma.  There is swelling.  No bony tenderness.  She did not hit her head or sustain any other injury.  Past Medical History:  Diagnosis Date  . Abdominal aneurysm without mention of rupture   . Abdominal pain, unspecified site   . Acute bronchitis   . Allergic rhinitis due to pollen   . CAD (coronary artery disease)   . Chronic kidney disease, stage II (mild)   . Essential hypertension, malignant   . GERD (gastroesophageal reflux disease)   . Headache(784.0)   . High cholesterol   . Memory loss 01/30/2013  . OSA (obstructive sleep apnea)    mod OSA '07  . Osteoarthrosis, unspecified whether generalized or localized, unspecified site   . PE (pulmonary embolism)    2005  . Pure hypercholesterolemia     Patient Active Problem List   Diagnosis Date Noted  . Unspecified open wound, left foot, subsequent encounter   . AKI (acute kidney injury) ([place])   . Protein-calorie malnutrition, severe 07/26/2016  . Left foot infection 07/25/2016  . Cellulitis of left foot 07/25/2016  . Dementia 07/25/2016  . DNR (do not  resuscitate) 07/25/2016  . Normocytic anemia 07/25/2016  . Dissection of aorta, thoracic ([place]) 02/22/2016  . Hypertension 02/22/2016  . Tibial plateau fracture, left 02/22/2016  . Chronic kidney disease, stage 3 ([place]) 02/22/2016  . Left tibial fracture   . Memory loss 01/30/2013  . Unspecified sleep apnea 12/25/2012  . Headache(784.0) 12/25/2012    Past Surgical History:  Procedure Laterality Date  . CATARACT EXTRACTION W/PHACO  03/14/2012   Procedure: CATARACT EXTRACTION PHACO AND INTRAOCULAR LENS PLACEMENT (IOC);  Surgeon: Marylynn Pearson, MD;  Location: [facility];  Service: Ophthalmology;  Laterality: Left;  . EYE SURGERY  2010   cat ext right  . I&D EXTREMITY Left 07/29/2016   Procedure: Left Partial Calcaneus Excision;  Surgeon: Newt Minion, MD;  Location: [facility];  Service: Orthopedics;  Laterality: Left;  Marland Kitchen VASCULAR SURGERY  95? 97?   AAA    OB History   None      Home Medications    Prior to Admission medications   Medication Sig Start Date End Date Taking? Authorizing Provider  aspirin 81 MG chewable tablet Chew by mouth daily.   Yes [provider]  benazepril (LOTENSIN) 20 MG tablet Take 20 mg by mouth 2 (two) times daily. 05/06/16  Yes  [provider]  metoprolol succinate (TOPROL-XL) 50 MG 24 hr tablet Take 50 mg by mouth daily. Take with or immediately following a meal.   Yes [provider]  MYRBETRIQ 50 MG TB24 tablet Take 50 mg by mouth daily. 02/05/16  Yes [provider]  simvastatin (ZOCOR) 40 MG tablet Take 40 mg by mouth at bedtime. 05/17/16  Yes [provider]  acetaminophen (TYLENOL) 500 MG tablet Take 1,000 mg by mouth every 6 (six) hours as needed for mild pain or fever.    [provider]  amLODipine (NORVASC) 5 MG tablet Take 1 tablet (5 mg total) by mouth daily. Patient not taking: Reported on 07/25/2016 02/26/16   Cherene Altes, MD  ondansetron (ZOFRAN) 4 MG tablet Take 1 tablet (4 mg total) by mouth  every 8 (eight) hours as needed for nausea or vomiting. 01/03/17   Kalman Shan Ratliff, DO  traMADol (ULTRAM) 50 MG tablet Take 1 tablet (50 mg total) by mouth every 8 (eight) hours as needed for severe pain. 07/14/18   Raylene Everts, MD    Family History Family History  Problem Relation Age of Onset  . Anesthesia problems Neg Hx     Social History Social History   Tobacco Use  . Smoking status: Former Smoker    Last attempt to quit: 10/20/1993    Years since quitting: 24.7  . Smokeless tobacco: Never Used  Substance Use Topics  . Alcohol use: No  . Drug use: No     Allergies   Codeine and Morphine and related   Review of Systems Review of Systems  Constitutional: Negative for chills and fever.  HENT: Negative for ear pain and sore throat.   Eyes: Negative for pain and visual disturbance.  Respiratory: Negative for cough and shortness of breath.   Cardiovascular: Negative for chest pain and palpitations.  Gastrointestinal: Negative for abdominal pain and vomiting.  Genitourinary: Negative for dysuria and hematuria.  Musculoskeletal: Positive for gait problem. Negative for arthralgias and back pain.       Use walker.  Poor balance.  Poor strength  Skin: Positive for wound. Negative for color change and rash.       Right forearm  Neurological: Negative for seizures and syncope.  All other systems reviewed and are negative.    Physical Exam Triage Vital Signs ED Triage Vitals [07/14/18 1823]  Enc Vitals Group     BP 138/84     Pulse Rate 86     Resp 20     Temp 99.3 F (37.4 C)     Temp Source Oral     SpO2 95 %   No data found.  Updated Vital Signs BP 138/84 (BP Location: Right Arm)   Pulse 86   Temp 99.3 F (37.4 C) (Oral)   Resp 20   SpO2 95%        Physical Exam  Constitutional: She appears well-developed and well-nourished. No distress.  Thin elderly woman.  Examined in wheelchair.  HENT:  Head: Normocephalic and atraumatic.    Mouth/Throat: Oropharynx is clear and moist.  Eyes: Pupils are equal, round, and reactive to light. Conjunctivae are normal.  Neck: Normal range of motion.  Cardiovascular: Normal rate.  Pulmonary/Chest: Effort normal. No respiratory distress.  Abdominal: Soft. She exhibits no distension.  Musculoskeletal: Normal range of motion. She exhibits no edema.  Right forearm has a hematoma over the middle ulna, approximately 4 cm across.  Superficial abrasion.  No bony tenderness.  Good range of motion of the elbow and wrist. Right hip is tender over the greater trochanter.  There is a bruise just posterior to this over the ischial tuberosity region.  Patient has pain with hip range of motion.  She resist putting full weight on her hip.  Distal neurovascular is intact.  Neurological: She is alert.  Skin: Skin is warm and dry.  Psychiatric: She has a normal mood and affect.     UC Treatments / Results  Labs (all labs ordered are listed, but only abnormal results are displayed) Labs Reviewed - No data to display  EKG None  Radiology No results found.  Procedures Procedures (including critical care time)  Medications Ordered in UC Medications  acetaminophen (TYLENOL) tablet 650 mg (650 mg Oral Given 07/14/18 1821)  Tdap (BOOSTRIX) injection 0.5 mL (0.5 mLs Intramuscular Given 07/14/18 1909)    Initial Impression / Assessment and Plan / UC Course  I have reviewed the triage vital signs and the nursing notes.  Pertinent labs & imaging results that were available during my care of the patient were reviewed by me and considered in my medical decision making (see chart for details).     The x-rays and showed them to daughter and Ms. Isaly.  She has osteopenia.  She has osteoarthritis.  No evidence of fracture.  We discussed treatment.  Follow-up with her PCP. Final Clinical Impressions(s) / UC Diagnoses   Final diagnoses:  Contusion of right hip, initial encounter  Pain of right hip  joint  Contusion of right forearm, initial encounter  Malignant neoplasm of right fallopian tube [facility])  Fall, initial encounter     Discharge Instructions     Must use walker for ambulation Limit walking while hip is painful May take tramadol as needed for pain that is severe Caution drowsiness on tramadol Caution constipation on tramadol Call your family doctor on Monday to be seen next week    ED Prescriptions    Medication Sig Dispense Auth. Provider   traMADol (ULTRAM) 50 MG tablet Take 1 tablet (50 mg total) by mouth every 8 (eight) hours as needed for severe pain. 20 tablet Raylene Everts, MD     Controlled Substance Prescriptions [place] Controlled Substance Registry consulted? No   Raylene Everts, MD 07/14/18 1925

## 2018-07-14 NOTE — Discharge Instructions (Addendum)
Must use walker for ambulation Limit walking while hip is painful May take tramadol as needed for pain that is severe Caution drowsiness on tramadol Caution constipation on tramadol Call your Dr Jess Barters office on Monday to be seen next week

## 2018-08-13 ENCOUNTER — Encounter (INDEPENDENT_AMBULATORY_CARE_PROVIDER_SITE_OTHER): Payer: Self-pay | Admitting: Orthopedic Surgery

## 2018-08-13 ENCOUNTER — Ambulatory Visit (INDEPENDENT_AMBULATORY_CARE_PROVIDER_SITE_OTHER): Payer: Medicare HMO | Admitting: Orthopedic Surgery

## 2018-08-13 VITALS — Ht 67.0 in | Wt 130.0 lb

## 2018-08-13 DIAGNOSIS — S32591S Other specified fracture of right pubis, sequela: Secondary | ICD-10-CM | POA: Diagnosis not present

## 2018-08-13 NOTE — Progress Notes (Signed)
Office Visit Note   Patient: Alexandria Walton           Date of Birth: 1929/08/11           MRN: [id number] Visit Date: 08/13/2018              Requested by: Rogers Blocker, MD [id number] [place], [place] [address] PCP: Rogers Blocker, MD  Chief Complaint  Patient presents with  . Right Hip - Follow-up      HPI: Patient is an 84 year old woman who is about a month out from a fall which she sustained a inferior and superior pubic rami fracture on the right.  Patient also has end-stage arthritis of her right hip.  Patient states she did not have hip pain prior to the fall she currently uses a walker.  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  Patient is a DNR.  She is recently started vitamin D3 and is currently taking Ultram for pain she is only taking 2 tablets.  Assessment & Plan: Visit Diagnoses:  1. Closed fracture of multiple pubic rami, right, sequela     Plan: Patient and family states that she is not a surgical candidate.  They do not want to try anything stronger than Tylenol for pain.  She will continue with her walker if she is still symptomatic in another month they will follow-up and we will repeat radiographs of the pelvis and hip.  Follow-Up Instructions: Return if symptoms worsen or fail to improve.   Ortho Exam  Patient is alert, oriented, no adenopathy, well-dressed, normal affect, normal respiratory effort. Examination patient has limited range of motion of the right hip she has internal and external rotation of about 20 degrees this is not painful.  Review of the radiographs shows an inferior and superior pubic rami fracture on the right.  Patient ambulates slowly with a walker requires assistance to get from sitting to standing  Imaging: No results found. No images are attached to the encounter.  Labs: Lab Results  Component Value Date   REPTSTATUS 10/26/2012 FINAL 10/25/2012   CULT INSIGNIFICANT GROWTH  10/25/2012     Lab Results  Component Value Date   ALBUMIN 4.5 01/03/2017   ALBUMIN 2.8 (L) 02/25/2016   ALBUMIN 3.4 (L) 04/13/2010    Body mass index is 20.36 kg/m.  Orders:  No orders of the defined types were placed in this encounter.  No orders of the defined types were placed in this encounter.    Procedures: No procedures performed  Clinical Data: No additional findings.  ROS:  All other systems negative, except as noted in the HPI. Review of Systems  Objective: Vital Signs: Ht 5\' 7"  (1.702 m)   Wt 130 lb (59 kg)   BMI 20.36 kg/m   Specialty Comments:  No specialty comments available.  PMFS History: Patient Active Problem List   Diagnosis Date Noted  . Closed fracture of multiple pubic rami, right, sequela 08/13/2018  . Unspecified open wound, left foot, subsequent encounter   . AKI (acute kidney injury) ([place])   . Protein-calorie malnutrition, severe 07/26/2016  . Left foot infection 07/25/2016  . Cellulitis of left foot 07/25/2016  . Dementia ([place]) 07/25/2016  . DNR (do not resuscitate) 07/25/2016  . Normocytic anemia 07/25/2016  . Dissection of aorta, thoracic ([place]) 02/22/2016  . Hypertension 02/22/2016  . Tibial plateau fracture, left 02/22/2016  . Chronic kidney disease, stage 3 ([place]) 02/22/2016  .  Left tibial fracture   . Memory loss 01/30/2013  . Unspecified sleep apnea 12/25/2012  . Headache(784.0) 12/25/2012   Past Medical History:  Diagnosis Date  . Abdominal aneurysm without mention of rupture   . Abdominal pain, unspecified site   . Acute bronchitis   . Allergic rhinitis due to pollen   . CAD (coronary artery disease)   . Chronic kidney disease, stage II (mild)   . Essential hypertension, malignant   . GERD (gastroesophageal reflux disease)   . Headache(784.0)   . High cholesterol   . Memory loss 01/30/2013  . OSA (obstructive sleep apnea)    mod OSA '07  . Osteoarthrosis, unspecified whether generalized or localized,  unspecified site   . PE (pulmonary embolism)    2005  . Pure hypercholesterolemia     Family History  Problem Relation Age of Onset  . Anesthesia problems Neg Hx     Past Surgical History:  Procedure Laterality Date  . CATARACT EXTRACTION W/PHACO  03/14/2012   Procedure: CATARACT EXTRACTION PHACO AND INTRAOCULAR LENS PLACEMENT (IOC);  Surgeon: Marylynn Pearson, MD;  Location: [facility];  Service: Ophthalmology;  Laterality: Left;  . EYE SURGERY  2010   cat ext right  . I&D EXTREMITY Left 07/29/2016   Procedure: Left Partial Calcaneus Excision;  Surgeon: Newt Minion, MD;  Location: [facility];  Service: Orthopedics;  Laterality: Left;  Marland Kitchen VASCULAR SURGERY  95? 97?   AAA   Social History   Occupational History  . Not on file  Tobacco Use  . Smoking status: Former Smoker    Last attempt to quit: 10/20/1993    Years since quitting: 24.8  . Smokeless tobacco: Never Used  Substance and Sexual Activity  . Alcohol use: No  . Drug use: No  . Sexual activity: Not on file

## 2018-11-28 ENCOUNTER — Ambulatory Visit
Admission: RE | Admit: 2018-11-28 | Discharge: 2018-11-28 | Disposition: A | Discharge status: Home / Self Care | Payer: Medicare HMO | Source: Ambulatory Visit | Attending: Internal Medicine | Admitting: Internal Medicine

## 2018-11-28 ENCOUNTER — Other Ambulatory Visit: Payer: Self-pay | Admitting: Internal Medicine

## 2018-11-28 ENCOUNTER — Other Ambulatory Visit: Payer: Self-pay | Admitting: Emergency Medicine

## 2018-11-28 DIAGNOSIS — Z09 Encounter for follow-up examination after completed treatment for conditions other than malignant neoplasm: Secondary | ICD-10-CM

## 2019-01-11 ENCOUNTER — Ambulatory Visit: Payer: Medicare Other | Admitting: Podiatry

## 2019-01-11 ENCOUNTER — Encounter: Payer: Self-pay | Admitting: Podiatry

## 2019-01-11 DIAGNOSIS — M79676 Pain in unspecified toe(s): Secondary | ICD-10-CM | POA: Diagnosis not present

## 2019-01-11 DIAGNOSIS — B351 Tinea unguium: Secondary | ICD-10-CM | POA: Diagnosis not present

## 2019-01-11 NOTE — Progress Notes (Signed)
Complaint:  Visit Type: Patient returns to my office for continued preventative foot care services. Complaint: Patient states" my nails have grown long and thick and become painful to walk and wear shoes" . The patient presents for preventative foot care services. No changes to ROS.   Patient has not been seen over 9 months.  Application of lidocaine cream was applied to her nails.  Podiatric Exam: Vascular: dorsalis pedis and posterior tibial pulses are palpable bilateral. Capillary return is immediate. Temperature gradient is WNL. Skin turgor WNL  Sensorium: Normal Semmes Weinstein monofilament test. Normal tactile sensation bilaterally. Nail Exam: Pt has thick disfigured discolored nails with subungual debris noted bilateral entire nail hallux through fifth toenails Ulcer Exam: There is no evidence of ulcer or pre-ulcerative changes or infection. Orthopedic Exam: Muscle tone and strength are WNL. No limitations in general ROM. No crepitus or effusions noted. Foot type and digits show no abnormalities. Bony prominences are unremarkable. Skin: No Porokeratosis. No infection or ulcers  Diagnosis:  Onychomycosis, , Pain in right toe, pain in left toes  Treatment & Plan Procedures and Treatment: Consent by patient was obtained for treatment procedures.   Debridement of mycotic and hypertrophic toenails, 1 through 5 bilateral and clearing of subungual debris. No ulceration, no infection noted.  Return Visit-Office Procedure: Patient instructed to return to the office for a follow up visit 10 weeks  for continued evaluation and treatment.    Malkia Nippert DPM 

## 2019-03-11 DIAGNOSIS — H6123 Impacted cerumen, bilateral: Secondary | ICD-10-CM | POA: Insufficient documentation

## 2019-04-12 ENCOUNTER — Other Ambulatory Visit: Payer: Self-pay

## 2019-04-12 ENCOUNTER — Ambulatory Visit: Payer: Medicare Other | Admitting: Podiatry

## 2019-04-12 ENCOUNTER — Encounter: Payer: Self-pay | Admitting: Podiatry

## 2019-04-12 VITALS — Temp 97.9°F

## 2019-04-12 DIAGNOSIS — B351 Tinea unguium: Secondary | ICD-10-CM

## 2019-04-12 DIAGNOSIS — M79676 Pain in unspecified toe(s): Secondary | ICD-10-CM | POA: Diagnosis not present

## 2019-04-12 NOTE — Progress Notes (Signed)
Complaint:  Visit Type: Patient returns to my office for continued preventative foot care services. Complaint: Patient states" my nails have grown long and thick and become painful to walk and wear shoes" . The patient presents for preventative foot care services. No changes to ROS.   Patient has not been seen over 9 months.  Application of lidocaine cream was applied to her nails.  Podiatric Exam: Vascular: dorsalis pedis and posterior tibial pulses are palpable bilateral. Capillary return is immediate. Temperature gradient is WNL. Skin turgor WNL  Sensorium: Normal Semmes Weinstein monofilament test. Normal tactile sensation bilaterally. Nail Exam: Pt has thick disfigured discolored nails with subungual debris noted bilateral entire nail hallux through fifth toenails Ulcer Exam: There is no evidence of ulcer or pre-ulcerative changes or infection. Orthopedic Exam: Muscle tone and strength are WNL. No limitations in general ROM. No crepitus or effusions noted. Foot type and digits show no abnormalities. Bony prominences are unremarkable. Skin: No Porokeratosis. No infection or ulcers  Diagnosis:  Onychomycosis, , Pain in right toe, pain in left toes  Treatment & Plan Procedures and Treatment: Consent by patient was obtained for treatment procedures.   Debridement of mycotic and hypertrophic toenails, 1 through 5 bilateral and clearing of subungual debris. No ulceration, no infection noted.  Return Visit-Office Procedure: Patient instructed to return to the office for a follow up visit 10 weeks  for continued evaluation and treatment.    Gardiner Barefoot DPM

## 2019-08-02 ENCOUNTER — Ambulatory Visit: Payer: Medicare Other | Admitting: Podiatry

## 2019-10-18 ENCOUNTER — Other Ambulatory Visit: Payer: Self-pay

## 2019-10-18 ENCOUNTER — Ambulatory Visit (INDEPENDENT_AMBULATORY_CARE_PROVIDER_SITE_OTHER): Payer: Medicare Other | Admitting: Podiatry

## 2019-10-18 ENCOUNTER — Encounter: Payer: Self-pay | Admitting: Podiatry

## 2019-10-18 DIAGNOSIS — M79676 Pain in unspecified toe(s): Secondary | ICD-10-CM

## 2019-10-18 DIAGNOSIS — B351 Tinea unguium: Secondary | ICD-10-CM | POA: Diagnosis not present

## 2019-10-18 NOTE — Progress Notes (Signed)
Complaint:  Visit Type: Patient returns to my office for continued preventative foot care services. Complaint: Patient states" my nails have grown long and thick and become painful to walk and wear shoes" . The patient presents for preventative foot care services. No changes to ROS.   Patient has not been seen over 6 months.  Application of lidocaine cream was applied to her nails.  Podiatric Exam: Vascular: dorsalis pedis and posterior tibial pulses are palpable bilateral. Capillary return is immediate. Temperature gradient is WNL. Skin turgor WNL  Sensorium: Normal Semmes Weinstein monofilament test. Normal tactile sensation bilaterally. Nail Exam: Pt has thick disfigured discolored nails with subungual debris noted bilateral entire nail hallux through fifth toenails Ulcer Exam: There is no evidence of ulcer or pre-ulcerative changes or infection. Orthopedic Exam: Muscle tone and strength are WNL. No limitations in general ROM. No crepitus or effusions noted. Foot type and digits show no abnormalities. Bony prominences are unremarkable. Skin: No Porokeratosis. No infection or ulcers  Diagnosis:  Onychomycosis, , Pain in right toe, pain in left toes  Treatment & Plan Procedures and Treatment: Consent by patient was obtained for treatment procedures.   Debridement of mycotic and hypertrophic toenails, 1 through 5 bilateral and clearing of subungual debris. No ulceration, no infection noted.  Bleeding noted second toe right foot due to mail growing into the proximal nail fold skin.  Cauterization/DSD.  Return Visit-Office Procedure: Patient instructed to return to the office for a follow up visit 3 months.  for continued evaluation and treatment.    Gardiner Barefoot DPM

## 2020-01-17 ENCOUNTER — Other Ambulatory Visit: Payer: Self-pay

## 2020-01-17 ENCOUNTER — Encounter: Payer: Self-pay | Admitting: Podiatry

## 2020-01-17 ENCOUNTER — Ambulatory Visit (INDEPENDENT_AMBULATORY_CARE_PROVIDER_SITE_OTHER): Payer: Medicare Other | Admitting: Podiatry

## 2020-01-17 VITALS — Temp 97.6°F

## 2020-01-17 DIAGNOSIS — N183 Chronic kidney disease, stage 3 unspecified: Secondary | ICD-10-CM

## 2020-01-17 DIAGNOSIS — B351 Tinea unguium: Secondary | ICD-10-CM

## 2020-01-17 DIAGNOSIS — M79676 Pain in unspecified toe(s): Secondary | ICD-10-CM | POA: Diagnosis not present

## 2020-01-17 NOTE — Progress Notes (Signed)
This patient returns to my office for at risk foot care.  This patient requires this care by a professional since this patient will be at risk due to having chronic kidney disease -3.   Patient presents with her son.  Patient requests lidocaine cream  application.  This patient is unable to cut nails herself since the patient cannot reach her  Nails  .These nails are painful walking and wearing shoes.  This patient presents for at risk foot care today.  General Appearance  Alert, conversant and in no acute stress.  Vascular  Dorsalis pedis and posterior tibial  pulses are palpable  bilaterally.  Capillary return is within normal limits  bilaterally. Temperature is within normal limits  bilaterally.  Neurologic  Senn-Weinstein monofilament wire test within normal limits  bilaterally. Muscle power within normal limits bilaterally.  Nails Thick disfigured discolored nails with subungual debris  from hallux to fifth toes bilaterally. No evidence of bacterial infection or drainage bilaterally.  Orthopedic  No limitations of motion  feet .  No crepitus or effusions noted.  No bony pathology or digital deformities noted.  Skin  normotropic skin with no porokeratosis noted bilaterally.  No signs of infections or ulcers noted.     Onychomycosis  Pain in right toes  Pain in left toes  Consent was obtained for treatment procedures.   Mechanical debridement of nails 1-5  bilaterally performed with a nail nipper.  Filed with dremel without incident. No infection or ulcer.     Return office visit  3 months.        Told patient to return for periodic foot care and evaluation due to potential at risk complications.   Gardiner Barefoot DPM

## 2020-02-12 ENCOUNTER — Emergency Department (HOSPITAL_COMMUNITY)
Admission: EM | Admit: 2020-02-12 | Discharge: 2020-02-12 | Disposition: A | Discharge status: Home / Self Care | Payer: Medicare Other | Attending: Emergency Medicine | Admitting: Emergency Medicine

## 2020-02-12 ENCOUNTER — Encounter (HOSPITAL_COMMUNITY): Payer: Self-pay | Admitting: Emergency Medicine

## 2020-02-12 ENCOUNTER — Emergency Department (HOSPITAL_COMMUNITY): Payer: Medicare Other

## 2020-02-12 ENCOUNTER — Other Ambulatory Visit: Payer: Self-pay

## 2020-02-12 DIAGNOSIS — R030 Elevated blood-pressure reading, without diagnosis of hypertension: Secondary | ICD-10-CM | POA: Diagnosis not present

## 2020-02-12 DIAGNOSIS — Z87891 Personal history of nicotine dependence: Secondary | ICD-10-CM | POA: Diagnosis not present

## 2020-02-12 DIAGNOSIS — R350 Frequency of micturition: Secondary | ICD-10-CM | POA: Diagnosis not present

## 2020-02-12 DIAGNOSIS — Z7982 Long term (current) use of aspirin: Secondary | ICD-10-CM | POA: Diagnosis not present

## 2020-02-12 DIAGNOSIS — I2510 Atherosclerotic heart disease of native coronary artery without angina pectoris: Secondary | ICD-10-CM | POA: Insufficient documentation

## 2020-02-12 DIAGNOSIS — Z86711 Personal history of pulmonary embolism: Secondary | ICD-10-CM | POA: Insufficient documentation

## 2020-02-12 DIAGNOSIS — F0390 Unspecified dementia without behavioral disturbance: Secondary | ICD-10-CM | POA: Diagnosis not present

## 2020-02-12 DIAGNOSIS — I129 Hypertensive chronic kidney disease with stage 1 through stage 4 chronic kidney disease, or unspecified chronic kidney disease: Secondary | ICD-10-CM | POA: Diagnosis not present

## 2020-02-12 DIAGNOSIS — N183 Chronic kidney disease, stage 3 unspecified: Secondary | ICD-10-CM | POA: Diagnosis not present

## 2020-02-12 DIAGNOSIS — Z79899 Other long term (current) drug therapy: Secondary | ICD-10-CM | POA: Diagnosis not present

## 2020-02-12 DIAGNOSIS — R519 Headache, unspecified: Secondary | ICD-10-CM | POA: Insufficient documentation

## 2020-02-12 LAB — CBC
HCT: 37.4 % (ref 36.0–46.0)
Hemoglobin: 11.6 g/dL — ABNORMAL LOW (ref 12.0–15.0)
MCH: 29.4 pg (ref 26.0–34.0)
MCHC: 31 g/dL (ref 30.0–36.0)
MCV: 94.7 fL (ref 80.0–100.0)
Platelets: 201 10*3/uL (ref 150–400)
RBC: 3.95 MIL/uL (ref 3.87–5.11)
RDW: 13.8 % (ref 11.5–15.5)
WBC: 9.9 10*3/uL (ref 4.0–10.5)
nRBC: 0 % (ref 0.0–0.2)

## 2020-02-12 LAB — COMPREHENSIVE METABOLIC PANEL
ALT: 18 U/L (ref 0–44)
AST: 19 U/L (ref 15–41)
Albumin: 3.8 g/dL (ref 3.5–5.0)
Alkaline Phosphatase: 55 U/L (ref 38–126)
Anion gap: 13 (ref 5–15)
BUN: 25 mg/dL — ABNORMAL HIGH (ref 8–23)
CO2: 23 mmol/L (ref 22–32)
Calcium: 9.6 mg/dL (ref 8.9–10.3)
Chloride: 101 mmol/L (ref 98–111)
Creatinine, Ser: 1.41 mg/dL — ABNORMAL HIGH (ref 0.44–1.00)
GFR calc Af Amer: 38 mL/min — ABNORMAL LOW (ref >60)
GFR calc non Af Amer: 33 mL/min — ABNORMAL LOW (ref >60)
Glucose, Bld: 139 mg/dL — ABNORMAL HIGH (ref 70–99)
Potassium: 4.6 mmol/L (ref 3.5–5.1)
Sodium: 137 mmol/L (ref 135–145)
Total Bilirubin: 0.7 mg/dL (ref 0.3–1.2)
Total Protein: 8 g/dL (ref 6.5–8.1)

## 2020-02-12 LAB — PROTIME-INR
INR: 1.1 (ref 0.8–1.2)
Prothrombin Time: 14.1 seconds (ref 11.4–15.2)

## 2020-02-12 LAB — URINALYSIS, ROUTINE W REFLEX MICROSCOPIC
Bacteria, UA: NONE SEEN
Bilirubin Urine: NEGATIVE
Glucose, UA: 50 mg/dL — AB
Ketones, ur: NEGATIVE mg/dL
Nitrite: NEGATIVE
Protein, ur: NEGATIVE mg/dL
Specific Gravity, Urine: 1.008 (ref 1.005–1.030)
pH: 8 (ref 5.0–8.0)

## 2020-02-12 LAB — CBG MONITORING, ED: Glucose-Capillary: 105 mg/dL — ABNORMAL HIGH (ref 70–99)

## 2020-02-12 LAB — I-STAT CHEM 8, ED
BUN: 26 mg/dL — ABNORMAL HIGH (ref 8–23)
Calcium, Ion: 1.2 mmol/L (ref 1.15–1.40)
Chloride: 103 mmol/L (ref 98–111)
Creatinine, Ser: 1.4 mg/dL — ABNORMAL HIGH (ref 0.44–1.00)
Glucose, Bld: 136 mg/dL — ABNORMAL HIGH (ref 70–99)
HCT: 37 % (ref 36.0–46.0)
Hemoglobin: 12.6 g/dL (ref 12.0–15.0)
Potassium: 4.3 mmol/L (ref 3.5–5.1)
Sodium: 137 mmol/L (ref 135–145)
TCO2: 26 mmol/L (ref 22–32)

## 2020-02-12 LAB — DIFFERENTIAL
Abs Immature Granulocytes: 0.06 10*3/uL (ref 0.00–0.07)
Basophils Absolute: 0 10*3/uL (ref 0.0–0.1)
Basophils Relative: 0 %
Eosinophils Absolute: 0 10*3/uL (ref 0.0–0.5)
Eosinophils Relative: 0 %
Immature Granulocytes: 1 %
Lymphocytes Relative: 6 %
Lymphs Abs: 0.5 10*3/uL — ABNORMAL LOW (ref 0.7–4.0)
Monocytes Absolute: 0.1 10*3/uL (ref 0.1–1.0)
Monocytes Relative: 1 %
Neutro Abs: 9.1 10*3/uL — ABNORMAL HIGH (ref 1.7–7.7)
Neutrophils Relative %: 92 %

## 2020-02-12 LAB — APTT: aPTT: 29 seconds (ref 24–36)

## 2020-02-12 MED ORDER — METOCLOPRAMIDE HCL 5 MG/ML IJ SOLN
5.0000 mg | Freq: Once | INTRAMUSCULAR | Status: AC
  Administered 2020-02-12: 5 mg via INTRAVENOUS
  Filled 2020-02-12: qty 2

## 2020-02-12 MED ORDER — DIPHENHYDRAMINE HCL 50 MG/ML IJ SOLN
12.5000 mg | Freq: Once | INTRAMUSCULAR | Status: AC
  Administered 2020-02-12: 13:00:00 12.5 mg via INTRAVENOUS
  Filled 2020-02-12: qty 1

## 2020-02-12 MED ORDER — SODIUM CHLORIDE 0.9 % IV BOLUS
500.0000 mL | Freq: Once | INTRAVENOUS | Status: AC
  Administered 2020-02-12: 500 mL via INTRAVENOUS

## 2020-02-12 NOTE — Discharge Instructions (Signed)
You have been diagnosed today with headache and elevated blood pressure.  At this time there does not appear to be the presence of an emergent medical condition, however there is always the potential for conditions to change. Please read and follow the below instructions.  Please return to the Emergency Department immediately for any new or worsening symptoms. Please be sure to follow up with your Primary Care Provider within one week regarding your visit today; please call their office to schedule an appointment even if you are feeling better for a follow-up visit. Please take your blood pressure medications as prescribed by primary care provider.  Please see your primary care doctor for a blood pressure recheck as scheduled this Friday and discuss medication management at that time.  Please drink plenty water and get plenty of rest.  Get help right away if: Your headache gets very bad quickly. You have chest pain or trouble breathing Your headache gets worse after a lot of physical activity. You keep throwing up. You have a stiff neck. You have trouble seeing. You have trouble speaking. You have pain in the eye or ear. Your muscles are weak or you lose muscle control. You lose your balance or have trouble walking. You feel like you will pass out (faint) or you pass out. You are mixed up (confused). You have a seizure. You have any new/concerning or worsening of symptoms  Please read the additional information packets attached to your discharge summary.  Do not take your medicine if  develop an itchy rash, swelling in your mouth or lips, or difficulty breathing; call 911 and seek immediate emergency medical attention if this occurs.  Note: Portions of this text may have been transcribed using voice recognition software. Every effort was made to ensure accuracy; however, inadvertent computerized transcription errors may still be present.

## 2020-02-12 NOTE — ED Notes (Signed)
Patient verbalizes understanding of discharge instructions. Opportunity for questioning and answers were provided. Armband removed by staff. Patient discharged from ED.  

## 2020-02-12 NOTE — ED Triage Notes (Signed)
Patient arrives with daughter c/o headache onset of today along with frequent urination. Patient recently started on prednisone by PCP for recurrent headaches. No neuro deficits noted in triage.

## 2020-02-12 NOTE — ED Provider Notes (Signed)
[facility] EMERGENCY DEPARTMENT Provider Note   CSN: [id number] Arrival date & time: 02/12/20  1110     History Chief Complaint  Patient presents with  . Headache    Alexandria Walton is a 86 y.o. female history of dementia, OSA, GERD, CAD, aortic aneurysm, CKD, high cholesterol, PE.  Patient presents today with her caretaker and power of attorney Alexandria Walton.  Patient presents today for headache, frontal pressure constant no clear aggravating or alleviating factors no radiation of pain, she reports that is very similar to previous headaches.  Alexandria Walton gave patient prednisone which is prescribed to her for headaches this morning at 6 AM when the headache started, minimal improvement and then around 9 AM gave daily home medications metoprolol and benazepril.  Headache persists.  Additional concern of urinary frequency over the past 2 days.  Daughter reports that patient is baseline mental status, she is occasionally forgetful but feels patient is acting normally today.  Denies fall/injury, vision changes, neck pain, chest pain/shortness of breath, abdominal pain, numbness/weakness, dysuria/hematuria or any additional concerns.  HPI     Past Medical History:  Diagnosis Date  . Abdominal aneurysm without mention of rupture   . Abdominal pain, unspecified site   . Acute bronchitis   . Allergic rhinitis due to pollen   . CAD (coronary artery disease)   . Chronic kidney disease, stage II (mild)   . Essential hypertension, malignant   . GERD (gastroesophageal reflux disease)   . Headache(784.0)   . High cholesterol   . Memory loss 01/30/2013  . OSA (obstructive sleep apnea)    mod OSA '07  . Osteoarthrosis, unspecified whether generalized or localized, unspecified site   . PE (pulmonary embolism)    2005  . Pure hypercholesterolemia     Patient Active Problem List   Diagnosis Date Noted  . Bilateral impacted cerumen 03/11/2019  . Closed fracture of multiple pubic  rami, right, sequela 08/13/2018  . Unspecified open wound, left foot, subsequent encounter   . AKI (acute kidney injury) ([place])   . Protein-calorie malnutrition, severe 07/26/2016  . Left foot infection 07/25/2016  . Cellulitis of left foot 07/25/2016  . Dementia ([place]) 07/25/2016  . DNR (do not resuscitate) 07/25/2016  . Normocytic anemia 07/25/2016  . Dissection of aorta, thoracic ([place]) 02/22/2016  . Hypertension 02/22/2016  . Tibial plateau fracture, left 02/22/2016  . Chronic kidney disease, stage 3 02/22/2016  . Left tibial fracture   . Memory loss 01/30/2013  . Unspecified sleep apnea 12/25/2012  . Headache(784.0) 12/25/2012    Past Surgical History:  Procedure Laterality Date  . CATARACT EXTRACTION W/PHACO  03/14/2012   Procedure: CATARACT EXTRACTION PHACO AND INTRAOCULAR LENS PLACEMENT (IOC);  Surgeon: Marylynn Pearson, MD;  Location: [facility];  Service: Ophthalmology;  Laterality: Left;  . EYE SURGERY  2010   cat ext right  . I & D EXTREMITY Left 07/29/2016   Procedure: Left Partial Calcaneus Excision;  Surgeon: Newt Minion, MD;  Location: [facility];  Service: Orthopedics;  Laterality: Left;  Marland Kitchen VASCULAR SURGERY  95? 97?   AAA     OB History   No obstetric history on file.     Family History  Problem Relation Age of Onset  . Anesthesia problems Neg Hx     Social History   Tobacco Use  . Smoking status: Former Smoker    Quit date: 10/20/1993    Years since quitting: 26.3  . Smokeless tobacco: Never Used  Substance  Use Topics  . Alcohol use: No  . Drug use: No    Home Medications Prior to Admission medications   Medication Sig Start Date End Date Taking? Authorizing Provider  acetaminophen (TYLENOL) 500 MG tablet Take 1,000 mg by mouth every 6 (six) hours as needed for mild pain or fever.   Yes [provider]  aspirin 81 MG chewable tablet Chew by mouth daily.   Yes [provider]  benazepril (LOTENSIN) 20 MG tablet Take 20 mg by mouth 2 (two)  times daily. 05/06/16  Yes [provider]  [employer] D3 50 MCG (2000 UT) CAPS Take 2,000 Units by mouth daily. 07/31/18  Yes [provider]  doxazosin (CARDURA) 2 MG tablet Take 1 mg by mouth at bedtime.  11/26/18  Yes [provider]  metoprolol succinate (TOPROL-XL) 100 MG 24 hr tablet Take 100 mg by mouth 2 (two) times daily. 11/05/18  Yes [provider]  Multiple Vitamins-Minerals (ADULT GUMMY) CHEW Chew 1 tablet by mouth daily.   Yes [provider]  MYRBETRIQ 50 MG TB24 tablet Take 50 mg by mouth daily. 02/05/16  Yes [provider]  predniSONE (DELTASONE) 20 MG tablet Take 20 mg by mouth daily. 02/05/20  Yes [provider]  simvastatin (ZOCOR) 40 MG tablet Take 40 mg by mouth at bedtime. 05/17/16  Yes [provider]    Allergies    Codeine, Morphine and related, and Other  Review of Systems   Review of Systems  Unable to perform ROS: Dementia    Physical Exam Updated Vital Signs BP (!) 188/94   Pulse 82   Temp 98.3 F (36.8 C) (Oral)   Resp 19   Ht 5\' 7"  (1.702 m)   Wt 57.6 kg   SpO2 97%   BMI 19.89 kg/m   Physical Exam Constitutional:      General: She is not in acute distress.    Appearance: Normal appearance. She is well-developed. She is not ill-appearing or diaphoretic.  HENT:     Head: Normocephalic and atraumatic.     Right Ear: External ear normal.     Left Ear: External ear normal.     Nose: Nose normal.  Eyes:     General: Vision grossly intact. Gaze aligned appropriately.     Extraocular Movements: Extraocular movements intact.     Conjunctiva/sclera: Conjunctivae normal.     Pupils: Pupils are equal, round, and reactive to light.     Comments: Visual fields grossly intact bilaterally  Neck:     Trachea: Trachea and phonation normal. No tracheal deviation.  Pulmonary:     Effort: Pulmonary effort is normal. No respiratory distress.  Abdominal:     General: There is no distension.      Palpations: Abdomen is soft.     Tenderness: There is no abdominal tenderness. There is no guarding or rebound.  Musculoskeletal:        General: Normal range of motion.     Cervical back: Normal range of motion.  Skin:    General: Skin is warm and dry.  Neurological:     Mental Status: She is alert.     GCS: GCS eye subscore is 4. GCS verbal subscore is 5. GCS motor subscore is 6.     Comments: Speech is clear and goal oriented, follows commands Major Cranial nerves without deficit, no facial droop Normal strength in upper and lower extremities bilaterally including dorsiflexion and plantar flexion, strong and equal grip strength Sensation  normal to light and sharp touch Moves extremities without ataxia, coordination intact Normal finger to nose and rapid alternating movements Neg romberg, no pronator drift Slow gait  Psychiatric:        Behavior: Behavior normal.     ED Results / Procedures / Treatments   Labs (all labs ordered are listed, but only abnormal results are displayed) Labs Reviewed  CBC - Abnormal; Notable for the following components:      Result Value   Hemoglobin 11.6 (*)    All other components within normal limits  DIFFERENTIAL - Abnormal; Notable for the following components:   Neutro Abs 9.1 (*)    Lymphs Abs 0.5 (*)    All other components within normal limits  COMPREHENSIVE METABOLIC PANEL - Abnormal; Notable for the following components:   Glucose, Bld 139 (*)    BUN 25 (*)    Creatinine, Ser 1.41 (*)    GFR calc non Af Amer 33 (*)    GFR calc Af Amer 38 (*)    All other components within normal limits  URINALYSIS, ROUTINE W REFLEX MICROSCOPIC - Abnormal; Notable for the following components:   Color, Urine STRAW (*)    Glucose, UA 50 (*)    Hgb urine dipstick SMALL (*)    Leukocytes,Ua TRACE (*)    All other components within normal limits  I-STAT CHEM 8, ED - Abnormal; Notable for the following components:   BUN 26 (*)    Creatinine, Ser  1.40 (*)    Glucose, Bld 136 (*)    All other components within normal limits  CBG MONITORING, ED - Abnormal; Notable for the following components:   Glucose-Capillary 105 (*)    All other components within normal limits  URINE CULTURE  PROTIME-INR  APTT    EKG EKG Interpretation  Date/Time:  Wednesday February 12 2020 11:22:17 EDT Ventricular Rate:  158 PR Interval:    QRS Duration: 96 QT Interval:  302 QTC Calculation: 489 R Axis:   -24 Text Interpretation: Supraventricular tachycardia Left ventricular hypertrophy with repolarization abnormality ( R in aVL , Sokolow-Lyon , Cornell product , Romhilt-Estes ) Confirmed by Blanchie Dessert [phone]) on 02/12/2020 11:50:12 AM   Radiology CT HEAD WO CONTRAST  Result Date: 02/12/2020 CLINICAL DATA:  Stroke suspected.  Headache. EXAM: CT HEAD WITHOUT CONTRAST TECHNIQUE: Contiguous axial images were obtained from the base of the skull through the vertex without intravenous contrast. COMPARISON:  Head CT 08/09/2013 FINDINGS: Brain: No acute intracranial hemorrhage. No focal mass lesion. No CT evidence of acute infarction. No midline shift or mass effect. No hydrocephalus. Basilar cisterns are patent. There are substantial periventricular and subcortical white matter hypodensities. Generalized cortical atrophy. Vascular: No hyperdense vessel or unexpected calcification. Skull: Normal. Negative for fracture or focal lesion. Sinuses/Orbits: Paranasal sinuses are clear. Partial opacification of the RIGHT mastoid air cells again noted. Other: None. IMPRESSION: 1. No acute intracranial findings. No CT evidence of acute cortical infarction. 2. Extensive white matter microvascular disease. 3. Chronic partial opacification of the RIGHT mastoid air cells. Electronically Signed   By: Suzy Bouchard M.D.   On: 02/12/2020 12:17    Procedures Procedures (including critical care time)  Medications Ordered in ED Medications  sodium chloride 0.9 % bolus 500 mL  (0 mLs Intravenous Stopped 02/12/20 1425)  metoCLOPramide (REGLAN) injection 5 mg (5 mg Intravenous Given 02/12/20 1256)  diphenhydrAMINE (BENADRYL) injection 12.5 mg (12.5 mg Intravenous Given 02/12/20 1257)    ED Course  I have reviewed  the triage vital signs and the nursing notes.  Pertinent labs & imaging results that were available during my care of the patient were reviewed by me and considered in my medical decision making (see chart for details).  Clinical Course as of Feb 12 1508  Wed Feb 12, 2020  1424 Improved   [BM]    Clinical Course User Index [BM] Gari Crown   MDM Rules/Calculators/A&P                     86 year old female with history as detailed above presents today for headache onset this morning around 6 AM, minimally improved with prednisone which POA reports was prescribed by PCP for headaches.  Blood pressure noted to be elevated by POA and patient was given home meds metoprolol and benazepril without improvement.  Baseline mental status per family member, alert and oriented x4 at this time.  EKG: Supraventricular tachycardia Left ventricular hypertrophy with repolarization abnormality ( R in aVL , Sokolow-Lyon , Cornell product , Romhilt-Estes ) Confirmed by Blanchie Dessert [phone]) on 02/12/2020 11:50:12 AM  EKG obtained in triage showed SVT, this resolved prior to my initial evaluation heart rate is now 90 bpm.  Denies any history of chest pain or shortness of breath.  Blood work ordered reviewed and interpreted.  PT/INR and APTT within normal limits.  CBG 105 no evidence of hypoglycemia.  Urinalysis shows 50 glucose, small hemoglobin trace leukocytes and 6-10 RBCs, does not appear infectious will send for culture.  CBC shows hemoglobin 11.6 which appears baseline no leukocytosis to suggest infection.  CMP shows baseline creatinine of 1.41, no emergent electrolyte derangement or elevation of LFTs.  CT head ordered, reviewed and interpreted by me, no  obvious intracranial hemorrhage.  CT Head: IMPRESSION: 1. No acute intracranial findings. No CT evidence of acute cortical infarction. 2. Extensive white matter microvascular disease. 3. Chronic partial opacification of the RIGHT mastoid air cells.  - Medications Reglan and Benadryl along with small fluid bolus were ordered to treat patient's migraine.  She was reassessed and reports improvement of pain and is requesting discharge.  Reexamination shows no neuro deficit she is well-appearing in no acute distress.  Will discharge with PCP follow-up.  Blood pressure remains elevated however she is currently asymptomatic regarding her hypertension, she is a follow-up appointment with her primary care provider and 2 days, Friday at 2 PM, have encouraged patient and POA to maintain current blood pressure medications and to follow-up with PCP as scheduled for recheck and medication management.  Doubt meningitis, venous sinus dural thrombosis, dissection, temporal arteritis or other emergent pathologies at this time.  At this time there does not appear to be any evidence of an acute emergency medical condition and the patient appears stable for discharge with appropriate outpatient follow up. Diagnosis was discussed with patient who verbalizes understanding of care plan and is agreeable to discharge. I have discussed return precautions with patient and Alexandria Walton who verbalizes understanding of return precautions. Patient encouraged to follow-up with their PCP. All questions answered.  Patient seen and evaluated by Dr. Maryan Rued during this visit who agrees with discharged and PCP follow-up.  Note: Portions of this report may have been transcribed using voice recognition software. Every effort was made to ensure accuracy; however, inadvertent computerized transcription errors may still be present. Final Clinical Impression(s) / ED Diagnoses Final diagnoses:  Nonintractable episodic headache, unspecified headache type   Elevated blood pressure reading    Rx / DC Orders  ED Discharge Orders    None       Gari Crown 02/12/20 [place], MD 02/13/20 1006

## 2020-02-13 LAB — URINE CULTURE: Culture: 10000 — AB

## 2020-04-15 ENCOUNTER — Emergency Department (HOSPITAL_COMMUNITY)
Admission: EM | Admit: 2020-04-15 | Discharge: 2020-04-15 | Disposition: A | Discharge status: Home / Self Care | Payer: Medicare Other | Attending: Emergency Medicine | Admitting: Emergency Medicine

## 2020-04-15 ENCOUNTER — Emergency Department (HOSPITAL_COMMUNITY): Payer: Medicare Other

## 2020-04-15 ENCOUNTER — Other Ambulatory Visit: Payer: Self-pay

## 2020-04-15 ENCOUNTER — Emergency Department (HOSPITAL_BASED_OUTPATIENT_CLINIC_OR_DEPARTMENT_OTHER): Payer: Medicare Other

## 2020-04-15 DIAGNOSIS — Z79899 Other long term (current) drug therapy: Secondary | ICD-10-CM | POA: Diagnosis not present

## 2020-04-15 DIAGNOSIS — Y9389 Activity, other specified: Secondary | ICD-10-CM | POA: Diagnosis not present

## 2020-04-15 DIAGNOSIS — R609 Edema, unspecified: Secondary | ICD-10-CM | POA: Diagnosis not present

## 2020-04-15 DIAGNOSIS — Z86711 Personal history of pulmonary embolism: Secondary | ICD-10-CM | POA: Insufficient documentation

## 2020-04-15 DIAGNOSIS — Y999 Unspecified external cause status: Secondary | ICD-10-CM | POA: Insufficient documentation

## 2020-04-15 DIAGNOSIS — N182 Chronic kidney disease, stage 2 (mild): Secondary | ICD-10-CM | POA: Diagnosis not present

## 2020-04-15 DIAGNOSIS — I129 Hypertensive chronic kidney disease with stage 1 through stage 4 chronic kidney disease, or unspecified chronic kidney disease: Secondary | ICD-10-CM | POA: Diagnosis not present

## 2020-04-15 DIAGNOSIS — K219 Gastro-esophageal reflux disease without esophagitis: Secondary | ICD-10-CM | POA: Diagnosis not present

## 2020-04-15 DIAGNOSIS — Y92099 Unspecified place in other non-institutional residence as the place of occurrence of the external cause: Secondary | ICD-10-CM | POA: Insufficient documentation

## 2020-04-15 DIAGNOSIS — R0789 Other chest pain: Secondary | ICD-10-CM | POA: Insufficient documentation

## 2020-04-15 DIAGNOSIS — I2510 Atherosclerotic heart disease of native coronary artery without angina pectoris: Secondary | ICD-10-CM | POA: Diagnosis not present

## 2020-04-15 DIAGNOSIS — R600 Localized edema: Secondary | ICD-10-CM | POA: Diagnosis present

## 2020-04-15 DIAGNOSIS — W06XXXA Fall from bed, initial encounter: Secondary | ICD-10-CM | POA: Diagnosis not present

## 2020-04-15 DIAGNOSIS — M7989 Other specified soft tissue disorders: Secondary | ICD-10-CM

## 2020-04-15 LAB — CBC
HCT: 37.1 % (ref 36.0–46.0)
Hemoglobin: 11.6 g/dL — ABNORMAL LOW (ref 12.0–15.0)
MCH: 30 pg (ref 26.0–34.0)
MCHC: 31.3 g/dL (ref 30.0–36.0)
MCV: 95.9 fL (ref 80.0–100.0)
Platelets: 166 10*3/uL (ref 150–400)
RBC: 3.87 MIL/uL (ref 3.87–5.11)
RDW: 14.8 % (ref 11.5–15.5)
WBC: 8.6 10*3/uL (ref 4.0–10.5)
nRBC: 0 % (ref 0.0–0.2)

## 2020-04-15 LAB — BASIC METABOLIC PANEL
Anion gap: 13 (ref 5–15)
BUN: 27 mg/dL — ABNORMAL HIGH (ref 8–23)
CO2: 23 mmol/L (ref 22–32)
Calcium: 9.1 mg/dL (ref 8.9–10.3)
Chloride: 107 mmol/L (ref 98–111)
Creatinine, Ser: 1.7 mg/dL — ABNORMAL HIGH (ref 0.44–1.00)
GFR calc Af Amer: 31 mL/min — ABNORMAL LOW (ref >60)
GFR calc non Af Amer: 26 mL/min — ABNORMAL LOW (ref >60)
Glucose, Bld: 151 mg/dL — ABNORMAL HIGH (ref 70–99)
Potassium: 4 mmol/L (ref 3.5–5.1)
Sodium: 143 mmol/L (ref 135–145)

## 2020-04-15 LAB — BRAIN NATRIURETIC PEPTIDE: B Natriuretic Peptide: 320.4 pg/mL — ABNORMAL HIGH (ref 0.0–100.0)

## 2020-04-15 NOTE — Discharge Instructions (Signed)
As we discussed your work-up today was reassuring.  Use compression socks.  As we discussed, sometimes amlodipine can cause leg swelling.  You may need to be restarted on your fluid pill.  Need to discuss with your primary care doctor regarding this.  Call their office and arrange for follow-up appointment.  Return the emergency for any worsening swelling, chest pain, difficulty breathing, fevers or any other worsening or concerning symptoms.

## 2020-04-15 NOTE — ED Provider Notes (Signed)
[facility] EMERGENCY DEPARTMENT Provider Note   CSN: [id number] Arrival date & time: 04/15/20  1118     History Chief Complaint  Patient presents with  . Leg Swelling    Alexandria Walton is a 86 y.o. female past medical history of abdominal aneurysm, CAD, CKD, hypertension, GERD who presents for evaluation of bilateral lower extremity swelling, left greater than right is been ongoing for about 2 weeks.  She states that she has not had leg swelling prior to onset 2 weeks ago.  She states that she feels like they are both swollen but the left is more swollen than the right.  It has not been red or hot.  She still has been able to ambulate and denies any pain.  She states she has not had any shortness of breath.  She reports that she had some right lateral chest wall pain after falling out of the bed few days ago and having her son pull her up.  Otherwise has not had any chest pain.  Denies any fever.  No history of exogenous hormone use, recent travel, recent surgeries or admissions, history of DVT.  Her chart mentions a history of a PE in 2005 but patient does not remember.  The history is provided by the patient.       Past Medical History:  Diagnosis Date  . Abdominal aneurysm without mention of rupture   . Abdominal pain, unspecified site   . Acute bronchitis   . Allergic rhinitis due to pollen   . CAD (coronary artery disease)   . Chronic kidney disease, stage II (mild)   . Essential hypertension, malignant   . GERD (gastroesophageal reflux disease)   . Headache(784.0)   . High cholesterol   . Memory loss 01/30/2013  . OSA (obstructive sleep apnea)    mod OSA '07  . Osteoarthrosis, unspecified whether generalized or localized, unspecified site   . PE (pulmonary embolism)    2005  . Pure hypercholesterolemia     Patient Active Problem List   Diagnosis Date Noted  . Bilateral impacted cerumen 03/11/2019  . Closed fracture of multiple pubic rami, right,  sequela 08/13/2018  . Unspecified open wound, left foot, subsequent encounter   . AKI (acute kidney injury) ([place])   . Protein-calorie malnutrition, severe 07/26/2016  . Left foot infection 07/25/2016  . Cellulitis of left foot 07/25/2016  . Dementia ([place]) 07/25/2016  . DNR (do not resuscitate) 07/25/2016  . Normocytic anemia 07/25/2016  . Dissection of aorta, thoracic ([place]) 02/22/2016  . Hypertension 02/22/2016  . Tibial plateau fracture, left 02/22/2016  . Chronic kidney disease, stage 3 02/22/2016  . Left tibial fracture   . Memory loss 01/30/2013  . Unspecified sleep apnea 12/25/2012  . Headache(784.0) 12/25/2012    Past Surgical History:  Procedure Laterality Date  . CATARACT EXTRACTION W/PHACO  03/14/2012   Procedure: CATARACT EXTRACTION PHACO AND INTRAOCULAR LENS PLACEMENT (IOC);  Surgeon: Marylynn Pearson, MD;  Location: [facility];  Service: Ophthalmology;  Laterality: Left;  . EYE SURGERY  2010   cat ext right  . I & D EXTREMITY Left 07/29/2016   Procedure: Left Partial Calcaneus Excision;  Surgeon: Newt Minion, MD;  Location: [facility];  Service: Orthopedics;  Laterality: Left;  Marland Kitchen VASCULAR SURGERY  95? 97?   AAA     OB History   No obstetric history on file.     Family History  Problem Relation Age of Onset  . Anesthesia problems Neg  Hx     Social History   Tobacco Use  . Smoking status: Former Smoker    Quit date: 10/20/1993    Years since quitting: 26.5  . Smokeless tobacco: Never Used  Substance Use Topics  . Alcohol use: No  . Drug use: No    Home Medications Prior to Admission medications   Medication Sig Start Date End Date Taking? Authorizing Provider  acetaminophen (TYLENOL) 500 MG tablet Take 1,000 mg by mouth every 6 (six) hours as needed for mild pain or fever.    [provider]  aspirin 81 MG chewable tablet Chew by mouth daily.    [provider]  benazepril (LOTENSIN) 20 MG tablet Take 20 mg by mouth 2 (two) times daily. 05/06/16    [provider]  CVS D3 50 MCG (2000 UT) CAPS Take 2,000 Units by mouth daily. 07/31/18   [provider]  doxazosin (CARDURA) 2 MG tablet Take 1 mg by mouth at bedtime.  11/26/18   [provider]  metoprolol succinate (TOPROL-XL) 100 MG 24 hr tablet Take 100 mg by mouth 2 (two) times daily. 11/05/18   [provider]  Multiple Vitamins-Minerals (ADULT GUMMY) CHEW Chew 1 tablet by mouth daily.    [provider]  MYRBETRIQ 50 MG TB24 tablet Take 50 mg by mouth daily. 02/05/16   [provider]  predniSONE (DELTASONE) 20 MG tablet Take 20 mg by mouth daily. 02/05/20   [provider]  simvastatin (ZOCOR) 40 MG tablet Take 40 mg by mouth at bedtime. 05/17/16   [provider]    Allergies    Codeine, Morphine and related, and Other  Review of Systems   Review of Systems  Constitutional: Negative for fever.  Respiratory: Negative for cough and shortness of breath.   Cardiovascular: Positive for leg swelling. Negative for chest pain.  Gastrointestinal: Negative for abdominal pain, nausea and vomiting.  Genitourinary: Negative for dysuria and hematuria.  Neurological: Negative for weakness, numbness and headaches.  All other systems reviewed and are negative.   Physical Exam Updated Vital Signs BP (!) 155/80 (BP Location: Right Arm)   Pulse 71   Temp 97.6 F (36.4 C) (Oral)   Resp 16   SpO2 97%   Physical Exam Vitals and nursing note reviewed.  Constitutional:      Appearance: Normal appearance. She is well-developed.     Comments: Sitting comfortably on examination table  HENT:     Head: Normocephalic and atraumatic.  Eyes:     General: Lids are normal.     Conjunctiva/sclera: Conjunctivae normal.     Pupils: Pupils are equal, round, and reactive to light.  Cardiovascular:     Rate and Rhythm: Normal rate and regular rhythm.     Pulses: Normal pulses.          Radial pulses are 2+ on the right side and 2+  on the left side.       Dorsalis pedis pulses are 2+ on the right side and 2+ on the left side.     Heart sounds: Normal heart sounds. No murmur. No friction rub. No gallop.   Pulmonary:     Effort: Pulmonary effort is normal.     Breath sounds: Normal breath sounds.     Comments: Lungs clear to auscultation bilaterally.  Symmetric chest rise.  No wheezing, rales, rhonchi. Abdominal:     Palpations: Abdomen is soft. Abdomen is not rigid.     Tenderness: There is  no abdominal tenderness. There is no guarding.     Comments: Abdomen is soft, non-distended, non-tender. No rigidity, No guarding. No peritoneal signs.  Musculoskeletal:        General: Normal range of motion.     Cervical back: Full passive range of motion without pain.     Comments: 1+ edema noted right lower extremity.  2+ pitting edema noted left lower extremity.  No overlying warmth, erythema.  No calf tenderness noted to palpation.  Skin:    General: Skin is warm and dry.     Capillary Refill: Capillary refill takes less than 2 seconds.     Comments: Good distal cap refill. BLE is not dusky in appearance or cool to touch.  Neurological:     Mental Status: She is alert and oriented to person, place, and time.  Psychiatric:        Speech: Speech normal.     ED Results / Procedures / Treatments   Labs (all labs ordered are listed, but only abnormal results are displayed) Labs Reviewed  CBC - Abnormal; Notable for the following components:      Result Value   Hemoglobin 11.6 (*)    All other components within normal limits  BASIC METABOLIC PANEL - Abnormal; Notable for the following components:   Glucose, Bld 151 (*)    BUN 27 (*)    Creatinine, Ser 1.70 (*)    GFR calc non Af Amer 26 (*)    GFR calc Af Amer 31 (*)    All other components within normal limits  BRAIN NATRIURETIC PEPTIDE - Abnormal; Notable for the following components:   B Natriuretic Peptide 320.4 (*)    All other components within normal limits      EKG None  Radiology DG Chest 2 View  Result Date: 04/15/2020 CLINICAL DATA:  Bilateral leg swelling for 1 week. EXAM: CHEST - 2 VIEW COMPARISON:  Chest x-ray 11/28/2018 and 02/23/2016 FINDINGS: The heart is within normal limits in size and stable. Stable appearance of the thoracic aortic aneurysm and dissection. The lungs are clear of an acute process. No findings for CHF, pleural effusions or pneumothorax. The bony thorax is intact. Stable mild thoracic compression deformities. IMPRESSION: No acute cardiopulmonary findings. Stable appearance of the thoracic aortic aneurysm. Electronically Signed   By: Marijo Sanes M.D.   On: 04/15/2020 12:34   VAS [place] LOWER EXTREMITY VENOUS (DVT) ([facility] and [facility] 7a-7p)  Result Date: 04/15/2020  Lower Venous DVTStudy Indications: Edema.  Performing Technologist: June Leap RDMS, RVT  Examination Guidelines: A complete evaluation includes B-mode imaging, spectral Doppler, color Doppler, and power Doppler as needed of all accessible portions of each vessel. Bilateral testing is considered an integral part of a complete examination. Limited examinations for reoccurring indications may be performed as noted. The reflux portion of the exam is performed with the patient in reverse Trendelenburg.  +-----+---------------+---------+-----------+----------+--------------+ RIGHTCompressibilityPhasicitySpontaneityPropertiesThrombus Aging +-----+---------------+---------+-----------+----------+--------------+ CFV  Full           Yes      Yes                                 +-----+---------------+---------+-----------+----------+--------------+   +---------+---------------+---------+-----------+----------+--------------+ LEFT     CompressibilityPhasicitySpontaneityPropertiesThrombus Aging +---------+---------------+---------+-----------+----------+--------------+ CFV      Full           Yes      Yes                                  +---------+---------------+---------+-----------+----------+--------------+  SFJ      Full                                                        +---------+---------------+---------+-----------+----------+--------------+ FV Prox  Full                                                        +---------+---------------+---------+-----------+----------+--------------+ FV Mid   Full                                                        +---------+---------------+---------+-----------+----------+--------------+ FV DistalFull                                                        +---------+---------------+---------+-----------+----------+--------------+ PFV      Full                                                        +---------+---------------+---------+-----------+----------+--------------+ POP      Full           Yes      Yes                                 +---------+---------------+---------+-----------+----------+--------------+ PTV      Full                                                        +---------+---------------+---------+-----------+----------+--------------+ PERO     Full                                                        +---------+---------------+---------+-----------+----------+--------------+     Summary: RIGHT: - No evidence of common femoral vein obstruction.  LEFT: - There is no evidence of deep vein thrombosis in the lower extremity.  - No cystic structure found in the popliteal fossa.  *See table(s) above for measurements and observations. Electronically signed by Monica Martinez MD on 04/15/2020 at 4:00:18 PM.    Final     Procedures Procedures (including critical care time)  Medications Ordered in ED Medications - No data to display  ED Course  I have reviewed the triage vital signs and the nursing notes.  Pertinent labs & imaging results that were available during my care of the patient  were reviewed by me and  considered in my medical decision making (see chart for details).    MDM Rules/Calculators/A&P                       86 year old female who presents for evaluation of 2 weeks of bilateral lower extremity edema, left greater than right.  No warmth, erythema.  No fevers.  Reports that she fell out of bed a few days ago and has since had some right lateral chest wall pain where her son pulled her up but otherwise denies any chest pain, difficulty breathing.  On initial ED arrival, she is afebrile, nontoxic-appearing.  Vital signs are stable.  On exam, she has good pulses bilaterally.  She does have edema both lower extremities but left does appear to be slightly more swollen than the right.  No overlying warmth, erythema.  Concern for CHF versus chronic venous stasis changes versus DVT.  Patient tells me she has never had history of DVT or PE but her chart does show history of PE in 2005.  We will plan for ultrasound, blood work, chest x-ray.  BMP shows BUN of 21 and Cr of 1.70. CBC shows no leukocytosis. Hgb stable at 11.6. CXR shows no acute abnormalities. BNP is 320.4.   Ultrasound is negative for DVT.  Discussed results with patient and family.  Family member states that patient has been on a fluid pill before but was taken off of it.  [name] family member also reports she is on amlodipine.  I discussed with patient that amlodipine could cause bilateral lower extremity swelling.  At this time, do not feel that starting her on Lasix again would be appropriate in the emergent setting.  Particular given kidney function.  Feel that this should be left a primary care doctor. At this time, patient exhibits no emergent life-threatening condition that require further evaluation in ED. Discussed patient with Dr. Ronnald Nian who is agreeable. Patient had ample opportunity for questions and discussion. All patient's questions were answered with full understanding. Strict return precautions discussed. Patient  expresses understanding and agreement to plan.   Portions of this note were generated with [employer]. Dictation errors may occur despite best attempts at proofreading.   Final Clinical Impression(s) / ED Diagnoses Final diagnoses:  Leg swelling    Rx / DC Orders ED Discharge Orders    None       Volanda Napoleon, PA-C 04/15/20 1853    Lennice Sites, DO 04/16/20 1240

## 2020-04-15 NOTE — ED Triage Notes (Signed)
Pt here for eval of bilateral leg swelling x 1 week. Denies chest pain or shortness of breath, endorses R shoulder pain from where her son helped her get out of the bed recently.

## 2020-04-20 ENCOUNTER — Ambulatory Visit: Payer: Medicare Other | Admitting: Podiatry

## 2020-04-29 ENCOUNTER — Ambulatory Visit: Payer: Medicare Other | Admitting: Podiatry

## 2020-10-09 ENCOUNTER — Encounter (INDEPENDENT_AMBULATORY_CARE_PROVIDER_SITE_OTHER): Payer: Self-pay | Admitting: Otolaryngology

## 2020-10-09 ENCOUNTER — Other Ambulatory Visit: Payer: Self-pay

## 2020-10-09 ENCOUNTER — Ambulatory Visit (INDEPENDENT_AMBULATORY_CARE_PROVIDER_SITE_OTHER): Payer: Medicare Other | Admitting: Otolaryngology

## 2020-10-09 VITALS — Temp 97.7°F

## 2020-10-09 DIAGNOSIS — H6123 Impacted cerumen, bilateral: Secondary | ICD-10-CM | POA: Diagnosis not present

## 2020-10-09 NOTE — Progress Notes (Signed)
HPI: Alexandria Walton is a 86 y.o. female who presents for evaluation of wax buildup in her ears.  She presents with her daughter from [place].. Daughter states that she had a hearing test a couple years ago and was told that she did not need hearing aids.  Past Medical History:  Diagnosis Date  . Abdominal aneurysm without mention of rupture   . Abdominal pain, unspecified site   . Acute bronchitis   . Allergic rhinitis due to pollen   . CAD (coronary artery disease)   . Chronic kidney disease, stage II (mild)   . Essential hypertension, malignant   . GERD (gastroesophageal reflux disease)   . Headache(784.0)   . High cholesterol   . Memory loss 01/30/2013  . OSA (obstructive sleep apnea)    mod OSA '07  . Osteoarthrosis, unspecified whether generalized or localized, unspecified site   . PE (pulmonary embolism)    2005  . Pure hypercholesterolemia    Past Surgical History:  Procedure Laterality Date  . CATARACT EXTRACTION W/PHACO  03/14/2012   Procedure: CATARACT EXTRACTION PHACO AND INTRAOCULAR LENS PLACEMENT (IOC);  Surgeon: Marylynn Pearson, MD;  Location: [facility];  Service: Ophthalmology;  Laterality: Left;  . EYE SURGERY  2010   cat ext right  . I & D EXTREMITY Left 07/29/2016   Procedure: Left Partial Calcaneus Excision;  Surgeon: Newt Minion, MD;  Location: [facility];  Service: Orthopedics;  Laterality: Left;  Marland Kitchen VASCULAR SURGERY  95? 97?   AAA   Social History   Socioeconomic History  . Marital status: Widowed    Spouse name: Not on file  . Number of children: Not on file  . Years of education: Not on file  . Highest education level: Not on file  Occupational History  . Not on file  Tobacco Use  . Smoking status: Former Smoker    Quit date: 10/20/1993    Years since quitting: 26.9  . Smokeless tobacco: Never Used  Substance and Sexual Activity  . Alcohol use: No  . Drug use: No  . Sexual activity: Not on file  Other Topics Concern  . Not on file  Social History  Narrative  . Not on file   Social Determinants of Health   Financial Resource Strain:   . Difficulty of Paying Living Expenses: Not on file  Food Insecurity:   . Worried About Charity fundraiser in the Last Year: Not on file  . Ran Out of Food in the Last Year: Not on file  Transportation Needs:   . Lack of Transportation (Medical): Not on file  . Lack of Transportation (Non-Medical): Not on file  Physical Activity:   . Days of Exercise per Week: Not on file  . Minutes of Exercise per Session: Not on file  Stress:   . Feeling of Stress : Not on file  Social Connections:   . Frequency of Communication with Friends and Family: Not on file  . Frequency of Social Gatherings with Friends and Family: Not on file  . Attends Religious Services: Not on file  . Active Member of Clubs or Organizations: Not on file  . Attends Archivist Meetings: Not on file  . Marital Status: Not on file   Family History  Problem Relation Age of Onset  . Anesthesia problems Neg Hx    Allergies  Allergen Reactions  . Codeine Nausea Only  . Morphine And Related Nausea Only  . Other Nausea Only  Prior to Admission medications   Medication Sig Start Date End Date Taking? Authorizing Provider  acetaminophen (TYLENOL) 500 MG tablet Take 1,000 mg by mouth every 6 (six) hours as needed for mild pain or fever.   Yes [provider]  aspirin 81 MG chewable tablet Chew by mouth daily.   Yes [provider]  benazepril (LOTENSIN) 20 MG tablet Take 20 mg by mouth 2 (two) times daily. 05/06/16  Yes [provider]  CVS D3 50 MCG (2000 UT) CAPS Take 2,000 Units by mouth daily. 07/31/18  Yes [provider]  doxazosin (CARDURA) 2 MG tablet Take 1 mg by mouth at bedtime.  11/26/18  Yes [provider]  metoprolol succinate (TOPROL-XL) 100 MG 24 hr tablet Take 100 mg by mouth 2 (two) times daily. 11/05/18  Yes [provider]  Multiple Vitamins-Minerals  (ADULT GUMMY) CHEW Chew 1 tablet by mouth daily.   Yes [provider]  MYRBETRIQ 50 MG TB24 tablet Take 50 mg by mouth daily. 02/05/16  Yes [provider]  predniSONE (DELTASONE) 20 MG tablet Take 20 mg by mouth daily. 02/05/20  Yes [provider]  simvastatin (ZOCOR) 40 MG tablet Take 40 mg by mouth at bedtime. 05/17/16  Yes [provider]     Positive ROS: Otherwise negative  All other systems have been reviewed and were otherwise negative with the exception of those mentioned in the HPI and as above.  Physical Exam: Constitutional: Alert, well-appearing, no acute distress Ears: External ears without lesions or tenderness. Ear canals with a large amount of wax obstructing both ear canals that was cleaned with curette and forceps.  Left TM was not normal right TM was retracted posteriorly but was clear otherwise.. Nasal: External nose without lesions. Clear nasal passages Oral: Oropharynx clear. Neck: No palpable adenopathy or masses Respiratory: Breathing comfortably  Skin: No facial/neck lesions or rash noted.  Cerumen impaction removal  Date/Time: 10/09/2020 2:24 PM Performed by: Rozetta Nunnery, MD Authorized by: Rozetta Nunnery, MD   Consent:    Consent obtained:  Verbal   Consent given by:  Patient   Risks discussed:  Pain and bleeding Procedure details:    Location:  L ear and R ear   Procedure type: curette and forceps   Post-procedure details:    Inspection:  TM intact and canal normal   Hearing quality:  Improved   Patient tolerance of procedure:  Tolerated well, no immediate complications Comments:     Left TM is clear.  Right TM is also clear but has retraction posteriorly.  No drainage and no signs of cholesteatoma.    Assessment: Bilateral cerumen buildup  Plan: She will follow-up as needed  Radene Journey, MD

## 2020-10-23 ENCOUNTER — Ambulatory Visit: Payer: Medicare Other | Admitting: Podiatry

## 2020-11-15 ENCOUNTER — Other Ambulatory Visit: Payer: Self-pay

## 2020-11-15 ENCOUNTER — Emergency Department (HOSPITAL_COMMUNITY)
Admission: EM | Admit: 2020-11-15 | Discharge: 2020-11-15 | Disposition: A | Discharge status: Home / Self Care | Payer: Medicare Other | Attending: Emergency Medicine | Admitting: Emergency Medicine

## 2020-11-15 ENCOUNTER — Encounter (HOSPITAL_COMMUNITY): Payer: Self-pay | Admitting: Emergency Medicine

## 2020-11-15 DIAGNOSIS — M6281 Muscle weakness (generalized): Secondary | ICD-10-CM | POA: Diagnosis not present

## 2020-11-15 DIAGNOSIS — R350 Frequency of micturition: Secondary | ICD-10-CM | POA: Diagnosis not present

## 2020-11-15 DIAGNOSIS — Z5321 Procedure and treatment not carried out due to patient leaving prior to being seen by health care provider: Secondary | ICD-10-CM | POA: Insufficient documentation

## 2020-11-15 DIAGNOSIS — R519 Headache, unspecified: Secondary | ICD-10-CM | POA: Insufficient documentation

## 2020-11-15 LAB — CBC
HCT: 35.6 % — ABNORMAL LOW (ref 36.0–46.0)
Hemoglobin: 11.7 g/dL — ABNORMAL LOW (ref 12.0–15.0)
MCH: 30.1 pg (ref 26.0–34.0)
MCHC: 32.9 g/dL (ref 30.0–36.0)
MCV: 91.5 fL (ref 80.0–100.0)
Platelets: 193 10*3/uL (ref 150–400)
RBC: 3.89 MIL/uL (ref 3.87–5.11)
RDW: 13.6 % (ref 11.5–15.5)
WBC: 8.1 10*3/uL (ref 4.0–10.5)
nRBC: 0 % (ref 0.0–0.2)

## 2020-11-15 LAB — BASIC METABOLIC PANEL
Anion gap: 13 (ref 5–15)
BUN: 17 mg/dL (ref 8–23)
CO2: 23 mmol/L (ref 22–32)
Calcium: 10 mg/dL (ref 8.9–10.3)
Chloride: 103 mmol/L (ref 98–111)
Creatinine, Ser: 1.49 mg/dL — ABNORMAL HIGH (ref 0.44–1.00)
GFR, Estimated: 33 mL/min — ABNORMAL LOW (ref >60)
Glucose, Bld: 97 mg/dL (ref 70–99)
Potassium: 4.1 mmol/L (ref 3.5–5.1)
Sodium: 139 mmol/L (ref 135–145)

## 2020-11-15 NOTE — ED Notes (Signed)
Patient family stated that she was taking her mother home.

## 2020-11-15 NOTE — ED Triage Notes (Addendum)
Pt states she didn't feel good when she woke up this morning- generalized weakness.  Reports frequent urination last night.  C/o headache- history of same.  Seen by PCP on Thursday.

## 2020-11-25 ENCOUNTER — Ambulatory Visit: Payer: Medicare Other | Admitting: Podiatry

## 2021-01-23 ENCOUNTER — Emergency Department (HOSPITAL_COMMUNITY): Payer: Medicare Other

## 2021-01-23 ENCOUNTER — Emergency Department (HOSPITAL_COMMUNITY)
Admission: EM | Admit: 2021-01-23 | Discharge: 2021-01-24 | Disposition: A | Discharge status: Home / Self Care | Payer: Medicare Other | Attending: Emergency Medicine | Admitting: Emergency Medicine

## 2021-01-23 ENCOUNTER — Encounter (HOSPITAL_COMMUNITY): Payer: Self-pay | Admitting: Emergency Medicine

## 2021-01-23 ENCOUNTER — Other Ambulatory Visit: Payer: Self-pay

## 2021-01-23 DIAGNOSIS — I2510 Atherosclerotic heart disease of native coronary artery without angina pectoris: Secondary | ICD-10-CM | POA: Diagnosis not present

## 2021-01-23 DIAGNOSIS — S0292XA Unspecified fracture of facial bones, initial encounter for closed fracture: Secondary | ICD-10-CM | POA: Insufficient documentation

## 2021-01-23 DIAGNOSIS — I129 Hypertensive chronic kidney disease with stage 1 through stage 4 chronic kidney disease, or unspecified chronic kidney disease: Secondary | ICD-10-CM | POA: Insufficient documentation

## 2021-01-23 DIAGNOSIS — Z79899 Other long term (current) drug therapy: Secondary | ICD-10-CM | POA: Diagnosis not present

## 2021-01-23 DIAGNOSIS — Z87891 Personal history of nicotine dependence: Secondary | ICD-10-CM | POA: Diagnosis not present

## 2021-01-23 DIAGNOSIS — F0390 Unspecified dementia without behavioral disturbance: Secondary | ICD-10-CM | POA: Insufficient documentation

## 2021-01-23 DIAGNOSIS — W1830XA Fall on same level, unspecified, initial encounter: Secondary | ICD-10-CM | POA: Diagnosis not present

## 2021-01-23 DIAGNOSIS — S022XXA Fracture of nasal bones, initial encounter for closed fracture: Secondary | ICD-10-CM | POA: Insufficient documentation

## 2021-01-23 DIAGNOSIS — Z7982 Long term (current) use of aspirin: Secondary | ICD-10-CM | POA: Insufficient documentation

## 2021-01-23 DIAGNOSIS — N182 Chronic kidney disease, stage 2 (mild): Secondary | ICD-10-CM | POA: Insufficient documentation

## 2021-01-23 DIAGNOSIS — S0993XA Unspecified injury of face, initial encounter: Secondary | ICD-10-CM | POA: Diagnosis present

## 2021-01-23 LAB — CBC WITH DIFFERENTIAL/PLATELET
Abs Immature Granulocytes: 0.02 10*3/uL (ref 0.00–0.07)
Basophils Absolute: 0.1 10*3/uL (ref 0.0–0.1)
Basophils Relative: 1 %
Eosinophils Absolute: 0.3 10*3/uL (ref 0.0–0.5)
Eosinophils Relative: 3 %
HCT: 34.5 % — ABNORMAL LOW (ref 36.0–46.0)
Hemoglobin: 10.7 g/dL — ABNORMAL LOW (ref 12.0–15.0)
Immature Granulocytes: 0 %
Lymphocytes Relative: 25 %
Lymphs Abs: 2 10*3/uL (ref 0.7–4.0)
MCH: 29.3 pg (ref 26.0–34.0)
MCHC: 31 g/dL (ref 30.0–36.0)
MCV: 94.5 fL (ref 80.0–100.0)
Monocytes Absolute: 0.7 10*3/uL (ref 0.1–1.0)
Monocytes Relative: 10 %
Neutro Abs: 4.8 10*3/uL (ref 1.7–7.7)
Neutrophils Relative %: 61 %
Platelets: 165 10*3/uL (ref 150–400)
RBC: 3.65 MIL/uL — ABNORMAL LOW (ref 3.87–5.11)
RDW: 13.8 % (ref 11.5–15.5)
WBC: 7.8 10*3/uL (ref 4.0–10.5)
nRBC: 0 % (ref 0.0–0.2)

## 2021-01-23 LAB — BASIC METABOLIC PANEL
Anion gap: 11 (ref 5–15)
BUN: 31 mg/dL — ABNORMAL HIGH (ref 8–23)
CO2: 21 mmol/L — ABNORMAL LOW (ref 22–32)
Calcium: 9.4 mg/dL (ref 8.9–10.3)
Chloride: 107 mmol/L (ref 98–111)
Creatinine, Ser: 1.63 mg/dL — ABNORMAL HIGH (ref 0.44–1.00)
GFR, Estimated: 30 mL/min — ABNORMAL LOW (ref >60)
Glucose, Bld: 81 mg/dL (ref 70–99)
Potassium: 4.4 mmol/L (ref 3.5–5.1)
Sodium: 139 mmol/L (ref 135–145)

## 2021-01-23 MED ORDER — AMOXICILLIN-POT CLAVULANATE 875-125 MG PO TABS
1.0000 | ORAL_TABLET | Freq: Once | ORAL | Status: AC
  Administered 2021-01-23: 1 via ORAL
  Filled 2021-01-23: qty 1

## 2021-01-23 NOTE — ED Provider Notes (Signed)
[facility] Provider Note   CSN: [id number] Arrival date & time: 01/23/21  2000     History Chief Complaint  Patient presents with  . Fall  . Head Injury    Alexandria Walton is a 87 y.o. female.  87 yo F with a chief complaints of a fall.  Family is not sure exactly what happened patient has a history of dementia.  States that she had walked into the other room and ended up hearing a loud bang.  They then went into the room and found that she had struck her face.  Had some significant swelling and bleeding to the nose.  They denied other significant injury.  Was complaining of some mild knee pain.  Denies neck pain denied chest pain abdominal pain back pain.  Denied any visual complaints.  The history is provided by the patient.  Fall This is a new problem. The current episode started yesterday. The problem occurs constantly. The problem has not changed since onset.Pertinent negatives include no chest pain, no headaches and no shortness of breath. Nothing aggravates the symptoms. Nothing relieves the symptoms. She has tried nothing for the symptoms. The treatment provided no relief.  Head Injury Associated symptoms: no headaches, no nausea and no vomiting        Past Medical History:  Diagnosis Date  . Abdominal aneurysm without mention of rupture   . Abdominal pain, unspecified site   . Acute bronchitis   . Allergic rhinitis due to pollen   . CAD (coronary artery disease)   . Chronic kidney disease, stage II (mild)   . Essential hypertension, malignant   . GERD (gastroesophageal reflux disease)   . Headache(784.0)   . High cholesterol   . Memory loss 01/30/2013  . OSA (obstructive sleep apnea)    mod OSA '07  . Osteoarthrosis, unspecified whether generalized or localized, unspecified site   . PE (pulmonary embolism)    2005  . Pure hypercholesterolemia     Patient Active Problem List   Diagnosis Date Noted  . Bilateral impacted  cerumen 03/11/2019  . Closed fracture of multiple pubic rami, right, sequela 08/13/2018  . Unspecified open wound, left foot, subsequent encounter   . AKI (acute kidney injury) ([place])   . Protein-calorie malnutrition, severe 07/26/2016  . Left foot infection 07/25/2016  . Cellulitis of left foot 07/25/2016  . Dementia ([place]) 07/25/2016  . DNR (do not resuscitate) 07/25/2016  . Normocytic anemia 07/25/2016  . Dissection of aorta, thoracic ([place]) 02/22/2016  . Hypertension 02/22/2016  . Tibial plateau fracture, left 02/22/2016  . Chronic kidney disease, stage 3 ([place]) 02/22/2016  . Left tibial fracture   . Memory loss 01/30/2013  . Unspecified sleep apnea 12/25/2012  . Headache(784.0) 12/25/2012    Past Surgical History:  Procedure Laterality Date  . CATARACT EXTRACTION W/PHACO  03/14/2012   Procedure: CATARACT EXTRACTION PHACO AND INTRAOCULAR LENS PLACEMENT (IOC);  Surgeon: Marylynn Pearson, MD;  Location: [facility];  Service: Ophthalmology;  Laterality: Left;  . EYE SURGERY  2010   cat ext right  . I & D EXTREMITY Left 07/29/2016   Procedure: Left Partial Calcaneus Excision;  Surgeon: Newt Minion, MD;  Location: [facility];  Service: Orthopedics;  Laterality: Left;  Marland Kitchen VASCULAR SURGERY  95? 97?   AAA     OB History   No obstetric history on file.     Family History  Problem Relation Age of Onset  . Anesthesia problems Neg Hx  Social History   Tobacco Use  . Smoking status: Former Smoker    Quit date: 10/20/1993    Years since quitting: 27.2  . Smokeless tobacco: Never Used  Substance Use Topics  . Alcohol use: No  . Drug use: No    Home Medications Prior to Admission medications   Medication Sig Start Date End Date Taking? Authorizing Provider  acetaminophen (TYLENOL) 500 MG tablet Take 1,000 mg by mouth every 6 (six) hours as needed for mild pain or fever.    [provider]  aspirin 81 MG chewable tablet Chew by mouth daily.    [provider]   benazepril (LOTENSIN) 20 MG tablet Take 20 mg by mouth 2 (two) times daily. 05/06/16   [provider]  [employer] D3 50 MCG (2000 UT) CAPS Take 2,000 Units by mouth daily. 07/31/18   [provider]  doxazosin (CARDURA) 2 MG tablet Take 1 mg by mouth at bedtime.  11/26/18   [provider]  metoprolol succinate (TOPROL-XL) 100 MG 24 hr tablet Take 100 mg by mouth 2 (two) times daily. 11/05/18   [provider]  Multiple Vitamins-Minerals (ADULT GUMMY) CHEW Chew 1 tablet by mouth daily.    [provider]  MYRBETRIQ 50 MG TB24 tablet Take 50 mg by mouth daily. 02/05/16   [provider]  predniSONE (DELTASONE) 20 MG tablet Take 20 mg by mouth daily. 02/05/20   [provider]  simvastatin (ZOCOR) 40 MG tablet Take 40 mg by mouth at bedtime. 05/17/16   [provider]    Allergies    Codeine, Morphine and related, and Other  Review of Systems   Review of Systems  Constitutional: Negative for chills and fever.  HENT: Positive for facial swelling and nosebleeds. Negative for congestion and rhinorrhea.   Eyes: Negative for redness and visual disturbance.  Respiratory: Negative for shortness of breath and wheezing.   Cardiovascular: Negative for chest pain and palpitations.  Gastrointestinal: Negative for nausea and vomiting.  Genitourinary: Negative for dysuria and urgency.  Musculoskeletal: Negative for arthralgias and myalgias.  Skin: Negative for pallor and wound.  Neurological: Negative for dizziness and headaches.    Physical Exam Updated Vital Signs BP (!) 178/89   Pulse 70   Temp 98.1 F (36.7 C) (Oral)   Resp (!) 23   Ht 5\' 7"  (1.702 m)   Wt 57.6 kg   SpO2 96%   BMI 19.89 kg/m   Physical Exam Vitals and nursing note reviewed.  Constitutional:      General: She is not in acute distress.    Appearance: She is well-developed. She is not diaphoretic.  HENT:     Head: Normocephalic.     Comments: Facial  swelling worse about the nose and between the eyes.  No periorbital rim tenderness.  No obvious oral trauma.  No nasal septal hematoma.  Significant swelling to bilateral naris. Eyes:     Pupils: Pupils are equal, round, and reactive to light.  Cardiovascular:     Rate and Rhythm: Normal rate and regular rhythm.     Heart sounds: No murmur heard. No friction rub. No gallop.   Pulmonary:     Effort: Pulmonary effort is normal.     Breath sounds: No wheezing or rales.  Abdominal:     General: There is no distension.     Palpations: Abdomen is soft.     Tenderness: There is no abdominal tenderness.  Musculoskeletal:  General: No tenderness.     Cervical back: Normal range of motion and neck supple.     Comments: Palpated from head to toe without any noted areas of bony tenderness.  Skin:    General: Skin is warm and dry.  Neurological:     Mental Status: She is alert and oriented to person, place, and time.  Psychiatric:        Behavior: Behavior normal.     ED Results / Procedures / Treatments   Labs (all labs ordered are listed, but only abnormal results are displayed) Labs Reviewed  CBC WITH DIFFERENTIAL/PLATELET - Abnormal; Notable for the following components:      Result Value   RBC 3.65 (*)    Hemoglobin 10.7 (*)    HCT 34.5 (*)    All other components within normal limits  BASIC METABOLIC PANEL    EKG EKG Interpretation  Date/Time:  Saturday January 23 2021 21:12:32 EDT Ventricular Rate:  65 PR Interval:    QRS Duration: 112 QT Interval:  420 QTC Calculation: 437 R Axis:   -5 Text Interpretation: Sinus rhythm Left ventricular hypertrophy Anterior ST elevation, probably due to LVH Since last tracing rate slower Confirmed by Deno Etienne [phone]) on 01/23/2021 9:26:17 PM   Radiology CT Head Wo Contrast  Result Date: 01/23/2021 CLINICAL DATA:  87 year old female with facial trauma. EXAM: CT HEAD WITHOUT CONTRAST CT MAXILLOFACIAL WITHOUT CONTRAST TECHNIQUE:  Multidetector CT imaging of the head and maxillofacial structures were performed using the standard protocol without intravenous contrast. Multiplanar CT image reconstructions of the maxillofacial structures were also generated. COMPARISON:  Head CT dated 02/12/2020. FINDINGS: CT HEAD FINDINGS Brain: Moderate age-related atrophy and chronic microvascular ischemic changes. Old left basal ganglia infarct. There is no acute intracranial hemorrhage. No mass effect midline shift. No extra-axial fluid collection. Vascular: No hyperdense vessel or unexpected calcification. Skull: Normal. Negative for fracture or focal lesion. Other: Small forehead hematoma. CT MAXILLOFACIAL FINDINGS Osseous: Mildly displaced acute fractures of the nasal bone. There is old-appearing fracture of the left zygomatic arch. Age indeterminate fracture of the left orbital floor, possibly old. There is also age indeterminate fracture of the right lamina Propecia likely old. Clinical correlation is recommended. No other acute fracture. No mandibular subluxation. Orbits: The globes and retro-orbital fat are preserved. Sinuses: Mild mucoperiosteal thickening of paranasal sinuses. No air-fluid level. Partial opacification of the right mastoid air cells. The left mastoid air cells are clear. Soft tissues: Soft tissue swelling over the nose. IMPRESSION: 1. No acute intracranial pathology. 2. Age-related atrophy and chronic microvascular ischemic changes. Old left basal ganglia infarct. 3. Mildly displaced acute fractures of the nasal bone. 4. Age indeterminate fractures of the left orbital floor, right lamina Propecia, and left zygomatic arch. Clinical correlation is recommended. Electronically Signed   By: Anner Crete M.D.   On: 01/23/2021 22:13   CT Maxillofacial Wo Contrast  Result Date: 01/23/2021 CLINICAL DATA:  87 year old female with facial trauma. EXAM: CT HEAD WITHOUT CONTRAST CT MAXILLOFACIAL WITHOUT CONTRAST TECHNIQUE: Multidetector  CT imaging of the head and maxillofacial structures were performed using the standard protocol without intravenous contrast. Multiplanar CT image reconstructions of the maxillofacial structures were also generated. COMPARISON:  Head CT dated 02/12/2020. FINDINGS: CT HEAD FINDINGS Brain: Moderate age-related atrophy and chronic microvascular ischemic changes. Old left basal ganglia infarct. There is no acute intracranial hemorrhage. No mass effect midline shift. No extra-axial fluid collection. Vascular: No hyperdense vessel or unexpected calcification. Skull: Normal. Negative for fracture or  focal lesion. Other: Small forehead hematoma. CT MAXILLOFACIAL FINDINGS Osseous: Mildly displaced acute fractures of the nasal bone. There is old-appearing fracture of the left zygomatic arch. Age indeterminate fracture of the left orbital floor, possibly old. There is also age indeterminate fracture of the right lamina Propecia likely old. Clinical correlation is recommended. No other acute fracture. No mandibular subluxation. Orbits: The globes and retro-orbital fat are preserved. Sinuses: Mild mucoperiosteal thickening of paranasal sinuses. No air-fluid level. Partial opacification of the right mastoid air cells. The left mastoid air cells are clear. Soft tissues: Soft tissue swelling over the nose. IMPRESSION: 1. No acute intracranial pathology. 2. Age-related atrophy and chronic microvascular ischemic changes. Old left basal ganglia infarct. 3. Mildly displaced acute fractures of the nasal bone. 4. Age indeterminate fractures of the left orbital floor, right lamina Propecia, and left zygomatic arch. Clinical correlation is recommended. Electronically Signed   By: Anner Crete M.D.   On: 01/23/2021 22:13    Procedures Procedures   Medications Ordered in ED Medications  amoxicillin-clavulanate (AUGMENTIN) 875-125 MG per tablet 1 tablet (has no administration in time range)    ED Course  I have reviewed the  triage vital signs and the nursing notes.  Pertinent labs & imaging results that were available during my care of the patient were reviewed by me and considered in my medical decision making (see chart for details).    MDM Rules/Calculators/A&P                          87 yo F with a chief complaints of a fall.  She is not sure exactly what happened.  Has a history of dementia.  Struck her face.  Denies any other significant areas of pain.  Will obtain a CT scan of the head and face.  Blood work.  Reassess.  CT face with multiple fractures.  Start on abx.  ENT follow up.   Awaiting metabolic panel.  Signed out to Dr. Darl Householder, please see their note for further details of care in the ED>   The patients results and plan were reviewed and discussed.   Any x-rays performed were independently reviewed by myself.   Differential diagnosis were considered with the presenting HPI.  Medications  amoxicillin-clavulanate (AUGMENTIN) 875-125 MG per tablet 1 tablet (has no administration in time range)    Vitals:   01/23/21 2115 01/23/21 2145 01/23/21 2200 01/23/21 2230  BP:   (!) 163/88 (!) 178/89  Pulse: 68 73 73 70  Resp: 19 (!) 25 (!) 25 (!) 23  Temp:      TempSrc:      SpO2: 98% 98% 98% 96%  Weight:      Height:        Final diagnoses:  Multiple facial fractures, closed, initial encounter ([place])      Final Clinical Impression(s) / ED Diagnoses Final diagnoses:  Multiple facial fractures, closed, initial encounter [facility])    Rx / [place] Orders ED Discharge Orders    None       Deno Etienne, DO 01/23/21 2326

## 2021-01-23 NOTE — ED Triage Notes (Signed)
Patient states she fell shortly PTA. Patient noted to have facial swelling and some epistaxis.

## 2021-01-24 MED ORDER — TRAMADOL HCL 50 MG PO TABS: 50.0000 mg | ORAL_TABLET | Freq: Four times a day (QID) | ORAL | 0 refills | Status: AC | PRN

## 2021-01-24 MED ORDER — AMOXICILLIN-POT CLAVULANATE 875-125 MG PO TABS: 1.0000 | ORAL_TABLET | Freq: Two times a day (BID) | ORAL | 0 refills | Status: DC

## 2021-01-24 MED ORDER — TRAMADOL HCL 50 MG PO TABS
50.0000 mg | ORAL_TABLET | Freq: Once | ORAL | Status: AC
  Administered 2021-01-24: 50 mg via ORAL
  Filled 2021-01-24: qty 1

## 2021-01-24 NOTE — Discharge Instructions (Signed)
You have a broken nose and some broken bones in your face.  Expect some swelling in the face and your face will be black and blue for several days.  Please put some ice for swelling   Take Augmentin twice daily for a week to prevent infection  Take Tylenol for pain and take tramadol for severe pain.  Please follow-up with ENT to discuss options  Return to ER if you have worse headaches, severe pain, vomiting.

## 2021-01-24 NOTE — ED Provider Notes (Signed)
  Physical Exam  BP (!) 173/93   Pulse 73   Temp 98.1 F (36.7 C) (Oral)   Resp (!) 25   Ht 5\' 7"  (1.702 m)   Wt 57.6 kg   SpO2 97%   BMI 19.89 kg/m   Physical Exam  ED Course/Procedures     Procedures  MDM  Care assumed at 11 PM.  Patient has a history of dementia and had a fall.  She had nasal bone fractures.  Signout pending chemistry  12:09 AM Chemistry at baseline.  Creatinine is 1.6 which is baseline.  Will start patient on Augmentin and recommend ENT follow-up.  Will give tramadol for pain.  Patient did not tolerate narcotics previously    Drenda Freeze, MD 01/24/21 0010

## 2021-02-01 ENCOUNTER — Observation Stay (HOSPITAL_COMMUNITY): Payer: Medicare Other

## 2021-02-01 ENCOUNTER — Other Ambulatory Visit: Payer: Self-pay

## 2021-02-01 ENCOUNTER — Inpatient Hospital Stay (HOSPITAL_COMMUNITY)
Admission: EM | Admit: 2021-02-01 | Discharge: 2021-02-04 | DRG: 069 | Disposition: A | Discharge status: Skilled Nursing Facility | Payer: Medicare Other | Attending: Family Medicine | Admitting: Family Medicine

## 2021-02-01 ENCOUNTER — Encounter (HOSPITAL_COMMUNITY): Payer: Self-pay | Admitting: Emergency Medicine

## 2021-02-01 ENCOUNTER — Emergency Department (HOSPITAL_COMMUNITY): Payer: Medicare Other

## 2021-02-01 DIAGNOSIS — S12112D Nondisplaced Type II dens fracture, subsequent encounter for fracture with routine healing: Secondary | ICD-10-CM

## 2021-02-01 DIAGNOSIS — R29705 NIHSS score 5: Secondary | ICD-10-CM | POA: Diagnosis present

## 2021-02-01 DIAGNOSIS — R059 Cough, unspecified: Secondary | ICD-10-CM

## 2021-02-01 DIAGNOSIS — Z87891 Personal history of nicotine dependence: Secondary | ICD-10-CM

## 2021-02-01 DIAGNOSIS — I714 Abdominal aortic aneurysm, without rupture: Secondary | ICD-10-CM | POA: Diagnosis present

## 2021-02-01 DIAGNOSIS — R4182 Altered mental status, unspecified: Secondary | ICD-10-CM | POA: Diagnosis not present

## 2021-02-01 DIAGNOSIS — Z79899 Other long term (current) drug therapy: Secondary | ICD-10-CM

## 2021-02-01 DIAGNOSIS — Z681 Body mass index (BMI) 19 or less, adult: Secondary | ICD-10-CM

## 2021-02-01 DIAGNOSIS — E46 Unspecified protein-calorie malnutrition: Secondary | ICD-10-CM | POA: Diagnosis present

## 2021-02-01 DIAGNOSIS — G459 Transient cerebral ischemic attack, unspecified: Principal | ICD-10-CM

## 2021-02-01 DIAGNOSIS — W19XXXD Unspecified fall, subsequent encounter: Secondary | ICD-10-CM | POA: Diagnosis present

## 2021-02-01 DIAGNOSIS — G4733 Obstructive sleep apnea (adult) (pediatric): Secondary | ICD-10-CM | POA: Diagnosis present

## 2021-02-01 DIAGNOSIS — Z20822 Contact with and (suspected) exposure to covid-19: Secondary | ICD-10-CM | POA: Diagnosis present

## 2021-02-01 DIAGNOSIS — E785 Hyperlipidemia, unspecified: Secondary | ICD-10-CM | POA: Diagnosis present

## 2021-02-01 DIAGNOSIS — Z86711 Personal history of pulmonary embolism: Secondary | ICD-10-CM

## 2021-02-01 DIAGNOSIS — M47812 Spondylosis without myelopathy or radiculopathy, cervical region: Secondary | ICD-10-CM | POA: Diagnosis present

## 2021-02-01 DIAGNOSIS — Z7952 Long term (current) use of systemic steroids: Secondary | ICD-10-CM

## 2021-02-01 DIAGNOSIS — I2510 Atherosclerotic heart disease of native coronary artery without angina pectoris: Secondary | ICD-10-CM | POA: Diagnosis present

## 2021-02-01 DIAGNOSIS — Z66 Do not resuscitate: Secondary | ICD-10-CM | POA: Diagnosis present

## 2021-02-01 DIAGNOSIS — R471 Dysarthria and anarthria: Secondary | ICD-10-CM | POA: Diagnosis present

## 2021-02-01 DIAGNOSIS — I7101 Dissection of thoracic aorta: Secondary | ICD-10-CM | POA: Diagnosis present

## 2021-02-01 DIAGNOSIS — K219 Gastro-esophageal reflux disease without esophagitis: Secondary | ICD-10-CM | POA: Diagnosis present

## 2021-02-01 DIAGNOSIS — F0390 Unspecified dementia without behavioral disturbance: Secondary | ICD-10-CM | POA: Diagnosis present

## 2021-02-01 DIAGNOSIS — K5903 Drug induced constipation: Secondary | ICD-10-CM | POA: Diagnosis not present

## 2021-02-01 DIAGNOSIS — Z885 Allergy status to narcotic agent status: Secondary | ICD-10-CM

## 2021-02-01 DIAGNOSIS — Z7982 Long term (current) use of aspirin: Secondary | ICD-10-CM

## 2021-02-01 DIAGNOSIS — N170 Acute kidney failure with tubular necrosis: Secondary | ICD-10-CM | POA: Diagnosis present

## 2021-02-01 DIAGNOSIS — I129 Hypertensive chronic kidney disease with stage 1 through stage 4 chronic kidney disease, or unspecified chronic kidney disease: Secondary | ICD-10-CM | POA: Diagnosis present

## 2021-02-01 DIAGNOSIS — S022XXD Fracture of nasal bones, subsequent encounter for fracture with routine healing: Secondary | ICD-10-CM

## 2021-02-01 DIAGNOSIS — R29810 Facial weakness: Secondary | ICD-10-CM | POA: Diagnosis present

## 2021-02-01 DIAGNOSIS — N1832 Chronic kidney disease, stage 3b: Secondary | ICD-10-CM | POA: Diagnosis present

## 2021-02-01 DIAGNOSIS — E7800 Pure hypercholesterolemia, unspecified: Secondary | ICD-10-CM | POA: Diagnosis present

## 2021-02-01 DIAGNOSIS — K5900 Constipation, unspecified: Secondary | ICD-10-CM | POA: Diagnosis present

## 2021-02-01 LAB — COMPREHENSIVE METABOLIC PANEL
ALT: 41 U/L (ref 0–44)
AST: 40 U/L (ref 15–41)
Albumin: 3.4 g/dL — ABNORMAL LOW (ref 3.5–5.0)
Alkaline Phosphatase: 90 U/L (ref 38–126)
Anion gap: 7 (ref 5–15)
BUN: 47 mg/dL — ABNORMAL HIGH (ref 8–23)
CO2: 26 mmol/L (ref 22–32)
Calcium: 9.6 mg/dL (ref 8.9–10.3)
Chloride: 108 mmol/L (ref 98–111)
Creatinine, Ser: 2.29 mg/dL — ABNORMAL HIGH (ref 0.44–1.00)
GFR, Estimated: 20 mL/min — ABNORMAL LOW (ref >60)
Glucose, Bld: 94 mg/dL (ref 70–99)
Potassium: 4.6 mmol/L (ref 3.5–5.1)
Sodium: 141 mmol/L (ref 135–145)
Total Bilirubin: 0.9 mg/dL (ref 0.3–1.2)
Total Protein: 7.2 g/dL (ref 6.5–8.1)

## 2021-02-01 LAB — I-STAT CHEM 8, ED
BUN: 50 mg/dL — ABNORMAL HIGH (ref 8–23)
Calcium, Ion: 1.18 mmol/L (ref 1.15–1.40)
Chloride: 108 mmol/L (ref 98–111)
Creatinine, Ser: 2.3 mg/dL — ABNORMAL HIGH (ref 0.44–1.00)
Glucose, Bld: 90 mg/dL (ref 70–99)
HCT: 34 % — ABNORMAL LOW (ref 36.0–46.0)
Hemoglobin: 11.6 g/dL — ABNORMAL LOW (ref 12.0–15.0)
Potassium: 4.6 mmol/L (ref 3.5–5.1)
Sodium: 143 mmol/L (ref 135–145)
TCO2: 27 mmol/L (ref 22–32)

## 2021-02-01 LAB — CBC
HCT: 34.1 % — ABNORMAL LOW (ref 36.0–46.0)
Hemoglobin: 10.9 g/dL — ABNORMAL LOW (ref 12.0–15.0)
MCH: 30.2 pg (ref 26.0–34.0)
MCHC: 32 g/dL (ref 30.0–36.0)
MCV: 94.5 fL (ref 80.0–100.0)
Platelets: 204 10*3/uL (ref 150–400)
RBC: 3.61 MIL/uL — ABNORMAL LOW (ref 3.87–5.11)
RDW: 13.9 % (ref 11.5–15.5)
WBC: 9.3 10*3/uL (ref 4.0–10.5)
nRBC: 0 % (ref 0.0–0.2)

## 2021-02-01 LAB — RESP PANEL BY RT-PCR (FLU A&B, COVID) ARPGX2
Influenza A by PCR: NEGATIVE
Influenza B by PCR: NEGATIVE
SARS Coronavirus 2 by RT PCR: NEGATIVE

## 2021-02-01 LAB — ETHANOL: Alcohol, Ethyl (B): <10 mg/dL (ref <10)

## 2021-02-01 LAB — DIFFERENTIAL
Abs Immature Granulocytes: 0.04 10*3/uL (ref 0.00–0.07)
Basophils Absolute: 0 10*3/uL (ref 0.0–0.1)
Basophils Relative: 0 %
Eosinophils Absolute: 0.3 10*3/uL (ref 0.0–0.5)
Eosinophils Relative: 3 %
Immature Granulocytes: 0 %
Lymphocytes Relative: 22 %
Lymphs Abs: 2 10*3/uL (ref 0.7–4.0)
Monocytes Absolute: 0.7 10*3/uL (ref 0.1–1.0)
Monocytes Relative: 8 %
Neutro Abs: 6.2 10*3/uL (ref 1.7–7.7)
Neutrophils Relative %: 67 %

## 2021-02-01 LAB — APTT: aPTT: 30 seconds (ref 24–36)

## 2021-02-01 LAB — AMMONIA: Ammonia: 29 umol/L (ref 9–35)

## 2021-02-01 LAB — PROTIME-INR
INR: 1.2 (ref 0.8–1.2)
Prothrombin Time: 15.1 seconds (ref 11.4–15.2)

## 2021-02-01 MED ORDER — AMOXICILLIN-POT CLAVULANATE 500-125 MG PO TABS
1.0000 | ORAL_TABLET | Freq: Two times a day (BID) | ORAL | Status: DC
  Administered 2021-02-01: 500 mg via ORAL
  Filled 2021-02-01 (×3): qty 1

## 2021-02-01 MED ORDER — SODIUM CHLORIDE 0.9 % IV BOLUS
500.0000 mL | Freq: Once | INTRAVENOUS | Status: AC
  Administered 2021-02-01: 500 mL via INTRAVENOUS

## 2021-02-01 MED ORDER — METOPROLOL SUCCINATE ER 100 MG PO TB24: 100.0000 mg | ORAL_TABLET | Freq: Two times a day (BID) | ORAL | Status: DC

## 2021-02-01 MED ORDER — ACETAMINOPHEN 325 MG PO TABS
650.0000 mg | ORAL_TABLET | ORAL | Status: DC | PRN
  Administered 2021-02-02: 650 mg via ORAL
  Filled 2021-02-01: qty 2

## 2021-02-01 MED ORDER — HEPARIN SODIUM (PORCINE) 5000 UNIT/ML IJ SOLN
5000.0000 [IU] | Freq: Three times a day (TID) | INTRAMUSCULAR | Status: DC
  Administered 2021-02-02 – 2021-02-04 (×7): 5000 [IU] via SUBCUTANEOUS
  Filled 2021-02-01 (×7): qty 1

## 2021-02-01 MED ORDER — FENTANYL CITRATE (PF) 100 MCG/2ML IJ SOLN
12.5000 ug | INTRAMUSCULAR | Status: DC | PRN
  Administered 2021-02-01: 12.5 ug via INTRAVENOUS
  Filled 2021-02-01: qty 2

## 2021-02-01 MED ORDER — METOPROLOL TARTRATE 5 MG/5ML IV SOLN
5.0000 mg | INTRAVENOUS | Status: DC | PRN
  Filled 2021-02-01: qty 5

## 2021-02-01 MED ORDER — POLYETHYLENE GLYCOL 3350 17 G PO PACK: 17.0000 g | PACK | Freq: Every day | ORAL | Status: DC

## 2021-02-01 MED ORDER — DOCUSATE SODIUM 100 MG PO CAPS
100.0000 mg | ORAL_CAPSULE | Freq: Two times a day (BID) | ORAL | Status: DC
  Administered 2021-02-01 – 2021-02-04 (×6): 100 mg via ORAL
  Filled 2021-02-01 (×6): qty 1

## 2021-02-01 MED ORDER — ACETAMINOPHEN 160 MG/5ML PO SOLN: 650.0000 mg | ORAL | Status: DC | PRN

## 2021-02-01 MED ORDER — ACETAMINOPHEN 650 MG RE SUPP: 650.0000 mg | RECTAL | Status: DC | PRN

## 2021-02-01 MED ORDER — METOPROLOL SUCCINATE ER 100 MG PO TB24
100.0000 mg | ORAL_TABLET | Freq: Two times a day (BID) | ORAL | Status: DC
  Administered 2021-02-01 – 2021-02-04 (×6): 100 mg via ORAL
  Filled 2021-02-01 (×6): qty 1

## 2021-02-01 MED ORDER — METOPROLOL TARTRATE 5 MG/5ML IV SOLN
5.0000 mg | INTRAVENOUS | Status: DC | PRN
  Administered 2021-02-01: 5 mg via INTRAVENOUS

## 2021-02-01 MED ORDER — SODIUM CHLORIDE 0.9 % IV SOLN: INTRAVENOUS | Status: DC

## 2021-02-01 MED ORDER — ASPIRIN 300 MG RE SUPP: 300.0000 mg | Freq: Every day | RECTAL | Status: DC

## 2021-02-01 MED ORDER — ASPIRIN 325 MG PO TABS: 325.0000 mg | ORAL_TABLET | Freq: Every day | ORAL | Status: DC

## 2021-02-01 MED ORDER — STROKE: EARLY STAGES OF RECOVERY BOOK
Freq: Once | Status: DC
  Filled 2021-02-01: qty 1

## 2021-02-01 NOTE — Code Documentation (Signed)
Stroke Response Nurse Documentation Code Documentation  Alexandria Walton is a 87 y.o. female arriving to [facility] ED via [facility] on 02/01/2021 with past medical hx of dementia. Code stroke was activated by ED. Patient from home where she was LKW at last night at 2230 when she went to bed and now complaining of some right sided weakness per EDP.   Patient and family reports patient falling last week and hitting her face with some noted facial fractures.   Stroke team at the bedside on patient activation. Labs drawn and patient cleared for CT by Dr. Ronnald Nian. Patient to CT with team. NIHSS 5, see documentation for details and code stroke times. Patient with bilateral leg weakness and right decreased sensation on exam. The following imaging was completed: CT. Patient is not a candidate for tPA due to being outside window. Care/Plan: MRI, q2 hour mNIHSS/VS. Bedside handoff with ED RN Camryn.    Kathrin Greathouse  Stroke Response RN

## 2021-02-01 NOTE — Consult Note (Addendum)
NEUROLOGY CONSULTATION NOTE   Date of service: February 01, 2021 Patient Name: Alexandria Walton MRN:  [id number] DOB:  [birth year] Reason for consult: "Stroke code" _ _ _   _ __   _ __ _ _  __ __   _ __   __ _  History of Present Illness  Alexandria Walton is a 87 y.o. female with PMH significant for HTN, CKD2, HLD, AAA, OA, hx of PE, dementia, recent fall with nasal fracture with facial swelling who presents with dysarthria, RUE weakness and BL lower ext weakness and R facial droop. She went to bed at 2300 on 01/31/21 and woke up with these symptoms. Eventually brought in to the hospital by daughter when symptoms did not improve. Daughter reports that speech is much better and facial droop is gone.  Prior hx of stroke with R sided deficit but no to minimal residual deficit. Takes aspirin 81mg  daily at home.  At baseline, patient struggles with dressing undressing herself. Does not cook or do groceries. Can walk with a walker. Has memory impairment and is forgetful.  MRS: 3 TPA: outside window Thrombectomy: No, given poor mRS. NIHSS components Score: Comment  1a Level of Conscious 0[x]  1[]  2[]  3[]      1b LOC Questions 0[x]  1[]  2[]       1c LOC Commands 0[x]  1[]  2[]       2 Best Gaze 0[x]  1[]  2[]       3 Visual 0[x]  1[]  2[]  3[]      4 Facial Palsy 0[x]  1[]  2[]  3[]      5a Motor Arm - left 0[x]  1[]  2[]  3[]  4[]  UN[]    5b Motor Arm - Right 0[x]  1[]  2[]  3[]  4[]  UN[]    6a Motor Leg - Left 0[]  1[]  2[x]  3[]  4[]  UN[]    6b Motor Leg - Right 0[]  1[]  2[x]  3[]  4[]  UN[]    7 Limb Ataxia 0[x]  1[]  2[]  3[]  UN[]     8 Sensory 0[]  1[x]  2[]  UN[]      9 Best Language 0[x]  1[]  2[]  3[]      10 Dysarthria 0[x]  1[]  2[]  UN[]      11 Extinct. and Inattention 0[x]  1[]  2[]       TOTAL: 5       ROS   Constitutional Denies weight loss, fever and chills.   HEENT Denies changes in vision and hearing.   Respiratory Denies SOB and cough.   CV Denies palpitations and CP   GI Denies abdominal pain, nausea, vomiting and diarrhea.    GU Denies dysuria and urinary frequency.   MSK Denies myalgia and joint pain.   Skin Denies rash and pruritus.   Neurological Denies headache and syncope.   Psychiatric Denies recent changes in mood. Denies anxiety and depression.    Past History   Past Medical History:  Diagnosis Date  . Abdominal aneurysm without mention of rupture   . Abdominal pain, unspecified site   . Acute bronchitis   . Allergic rhinitis due to pollen   . CAD (coronary artery disease)   . Chronic kidney disease, stage II (mild)   . Essential hypertension, malignant   . GERD (gastroesophageal reflux disease)   . Headache(784.0)   . High cholesterol   . Memory loss 01/30/2013  . OSA (obstructive sleep apnea)    mod OSA '07  . Osteoarthrosis, unspecified whether generalized or localized, unspecified site   . PE (pulmonary embolism)    2005  . Pure hypercholesterolemia    Past Surgical History:  Procedure Laterality Date  .  CATARACT EXTRACTION W/PHACO  03/14/2012   Procedure: CATARACT EXTRACTION PHACO AND INTRAOCULAR LENS PLACEMENT (IOC);  Surgeon: Marylynn Pearson, MD;  Location: [facility];  Service: Ophthalmology;  Laterality: Left;  . EYE SURGERY  2010   cat ext right  . I & D EXTREMITY Left 07/29/2016   Procedure: Left Partial Calcaneus Excision;  Surgeon: Newt Minion, MD;  Location: [facility];  Service: Orthopedics;  Laterality: Left;  Marland Kitchen VASCULAR SURGERY  95? 97?   AAA   Family History  Problem Relation Age of Onset  . Anesthesia problems Neg Hx    Social History   Socioeconomic History  . Marital status: Widowed    Spouse name: Not on file  . Number of children: Not on file  . Years of education: Not on file  . Highest education level: Not on file  Occupational History  . Not on file  Tobacco Use  . Smoking status: Former Smoker    Quit date: 10/20/1993    Years since quitting: 27.3  . Smokeless tobacco: Never Used  Substance and Sexual Activity  . Alcohol use: No  . Drug use: No  . Sexual  activity: Not on file  Other Topics Concern  . Not on file  Social History Narrative  . Not on file   Social Determinants of Health   Financial Resource Strain: Not on file  Food Insecurity: Not on file  Transportation Needs: Not on file  Physical Activity: Not on file  Stress: Not on file  Social Connections: Not on file   Allergies  Allergen Reactions  . Codeine Nausea Only  . Morphine And Related Nausea Only  . Other Nausea Only    Medications  (Not in a hospital admission)    Vitals   Vitals:   02/01/21 1231 02/01/21 1233 02/01/21 1330 02/01/21 1345  BP: 131/87  (!) 144/79 137/79  Pulse: 85  80 78  Resp: (!) 25  (!) 22 (!) 22  Temp: 98.2 F (36.8 C)     TempSrc: Oral     SpO2: 93%  91% 94%  Weight:  57.6 kg    Height:  5\' 7"  (1.702 m)       Body mass index is 19.89 kg/m.  Physical Exam   General: Laying comfortably in bed; in no acute distress.  HENT: Normal oropharynx and mucosa. Normal external appearance of ears and nose.  Neck: Supple, no pain or tenderness  CV: No JVD. No peripheral edema.  Pulmonary: Symmetric Chest rise. Normal respiratory effort.  Abdomen: Soft to touch, non-tender.  Ext: No cyanosis, edema, or deformity  Skin: No rash. Normal palpation of skin.   Musculoskeletal: Normal digits and nails by inspection. No clubbing.  Neurologic Examination  Mental status/Cognition: Alert, oriented to self, place, month and year, good attention.  Speech/language: Fluent, comprehension intact, object naming intact, repetition intact.  Cranial nerves:   CN II Pupils equal and reactive to light, no VF deficits    CN III,IV,VI EOM intact, no gaze preference or deviation, no nystagmus    CN V normal sensation in V1, V2, and V3 segments bilaterally    CN VII Mild R facial asymmetry but no obvious drooping.   CN VIII normal hearing to speech    CN IX & X normal palatal elevation, no uvular deviation    CN XI 5/5 head turn and 5/5 shoulder shrug  bilaterally    CN XII midline tongue protrusion    Motor:  Muscle bulk: poor, tone  normal, pronator drift none tremor none Mvmt Root Nerve  Muscle Right Left Comments  SA C5/6 Ax Deltoid 5 5   EF C5/6 Mc Biceps     EE C6/7/8 Rad Triceps     WF C6/7 Med FCR     WE C7/8 PIN ECU     F Ab C8/T1 U ADM/FDI 5 5   HF L1/2/3 Fem Illopsoas 3 3   KE L2/3/4 Fem Quad     DF L4/5 D Peron Tib Ant 4 4   PF S1/2 Tibial Grc/Sol 4 4    Reflexes:  Right Left Comments  Pectoralis      Biceps (C5/6) 2 2   Brachioradialis (C5/6) 2 2    Triceps (C6/7) 2 2    Patellar (L3/4) 2 2    Achilles (S1)      Hoffman      Plantar     Jaw jerk    Sensation:  Light touch Decreased mildly in R leg.   Pin prick    Temperature    Vibration   Proprioception    Coordination/Complex Motor:  - Finger to Nose intact BL - Heel to shin unable to do - Rapid alternating movement are slowed. - Gait: deferred.  Labs   CBC:  Recent Labs  Lab 02/01/21 1243 02/01/21 1252  WBC 9.3  --   NEUTROABS 6.2  --   HGB 10.9* 11.6*  HCT 34.1* 34.0*  MCV 94.5  --   PLT 204  --     Basic Metabolic Panel:  Lab Results  Component Value Date   NA 143 02/01/2021   K 4.6 02/01/2021   CO2 26 02/01/2021   GLUCOSE 90 02/01/2021   BUN 50 (H) 02/01/2021   CREATININE 2.30 (H) 02/01/2021   CALCIUM 9.6 02/01/2021   GFRNONAA 20 (L) 02/01/2021   GFRAA 31 (L) 04/15/2020   Lipid Panel:  Lab Results  Component Value Date   [facility]  04/09/2010    67        Total Cholesterol/HDL:CHD Risk Coronary Heart Disease Risk Table                     Men   Women  1/2 Average Risk   3.4   3.3  Average Risk       5.0   4.4  2 X Average Risk   9.6   7.1  3 X Average Risk  23.4   11.0        Use the calculated Patient Ratio above and the CHD Risk Table to determine the patient's CHD Risk.        ATP III CLASSIFICATION (LDL):  <100     mg/dL   Optimal  100-129  mg/dL   Near or Above                    Optimal  130-159  mg/dL    Borderline  160-189  mg/dL   High  >190     mg/dL   Very High   HgbA1c: No results found for: HGBA1C Urine Drug Screen: No results found for: LABOPIA, COCAINSCRNUR, LABBENZ, AMPHETMU, THCU, LABBARB  Alcohol Level     Component Value Date/Time   ETH <10 02/01/2021 1243    CT Head without contrast: CTH was negative for a large hypodensity concerning for a large territory infarct or hyperdensity concerning for an ICH  MRI Brain pending  Impression   LAURY HUIZAR is a  87 y.o. female with PMH significant for HTN, CKD2, HLD, AAA, OA, hx of PE, dementia, recent fall with nasal fracture with facial swelling who presents with dysarthria, RUE weakness, ?mild R facial droop. In addition, has known chronic BL lower ext weakness. Outside tPA window, not a candidate for thrombectomy due to poor functional baseline with underlying dementia. Her neurologic examination is notable for R leg numbness and BL lower extremity weakness. Dysarthria and R facial droop had improved per daughter.  Suspect that the episode was a minor ischemic stroke or a TIA.  Recommendations  Plan:  - Frequent Neuro checks per stroke unit protocol - Recommend brain imaging with MRI Brain without contrast - Recommend Vascular imaging with MRA Angio Head without contrast and US Carotid doppler - Recommend obtaining TTE - Recommend obtaining Lipid panel with LDL - Please start statin if LDL > 70 - Recommend HbA1c - Antithrombotic - Aspirin 81mg  daily. - Recommend DVT ppx - SBP goal - permissive hypertension first 24 h < 220/110. Held home meds.  - Recommend Telemetry monitoring for arrythmia - Recommend bedside swallow screen prior to PO intake. - Stroke education booklet - Recommend PT/OT/SLP consult   ______________________________________________________________________   Thank you for the opportunity to take part in the care of this patient. If you have any further questions, please contact the neurology  consultation attending.  Signed,  [place] Pager Number [phone] _ _ _   _ __   _ __ _ _  __ __   _ __   __ _

## 2021-02-01 NOTE — ED Notes (Signed)
Pt placed on Pure Wick

## 2021-02-01 NOTE — ED Provider Notes (Signed)
I personally evaluated the patient during the encounter and completed a history, physical, procedures, medical decision making to contribute to the overall care of the patient and decision making for the patient briefly, the patient is a 87 y.o. female strokelike symptoms.  Patient with normal vitals.  No fever.  Woke up this morning with some possibly right-sided weakness, difficulty with speech, right-sided facial droop.  Symptoms appear to be slightly improving but overall difficult to evaluate her due to severe dementia.  She had a recent fall where she broke her nose and has extensive bruising to her face.  She does not endorse any pain to her extremities.  She appears to have some trace right-sided weakness, right-sided facial droop.  Her speech does appear slightly normal.  Code stroke was initiated as last known normal was last night and she does have some evidence to suggest may be LVO.  CT scan was unremarkable.  Not a candidate for TPA or for thrombectomy.  Overall neurology recommends admission for TIA type work-up.  Any infectious symptoms.  Lab work is overall unremarkable.  Will admit for further work-up.   EKG Interpretation  Date/Time:  Monday February 01 2021 12:46:35 EDT Ventricular Rate:  82 PR Interval:    QRS Duration: 104 QT Interval:  388 QTC Calculation: 622 R Axis:   -34 Text Interpretation: Sinus rhythm Consider right atrial enlargement Abnormal R-wave progression, early transition LVH with secondary repolarization abnormality Confirmed by Lennice Sites [phone]) on 02/01/2021 1:38:55 PM           Lennice Sites, DO 02/01/21 1418

## 2021-02-01 NOTE — ED Triage Notes (Signed)
Pt BIB [facility] c/o AMS. Per family, pt was at baseline last night before bed, woke up this AM at 1115, "not acting like herself", decreased ability to speak, increased generalized weakness. Pt has a hx of dementia. Hx of fall last week, significant facial bruising, family reports broken nose, denies thinners. Per family, pt is ambulatory and a/o x2 at baseline.   EMS VS- 160/80, HR 110, CBG 132, SpO2 99% RA

## 2021-02-01 NOTE — ED Provider Notes (Signed)
[facility] EMERGENCY DEPARTMENT Provider Note   CSN: [id number] Arrival date & time: 02/01/21  1222     History Chief Complaint  Patient presents with  . Altered Mental Status    Alexandria Walton is a 87 y.o. female who presents via EMS for evaluation of facial droop, dysarthria and weakness.  History is gathered at bedside via EMS.  The patient has obvious facial bruising, EMS reports that she had a fall about a week ago and was evaluated in the ER at that time.  She has been doing well and is normally ambulatory.  EMS reports that the patient woke up this morning and her daughter noticed that she seemed to have dysarthria, facial droop on the right side and weakness on the right side of her body and was unable to get out of bed.  There is a level 5 caveat due to dementia and patient is unable to provide further history. Code stroke initiated after discussion with Dr. Lorrin Goodell as patient was last seen normal around 10:30 PM and would potentially qualify as LVO. She is not on any blood thinners.  HPI     Past Medical History:  Diagnosis Date  . Abdominal aneurysm without mention of rupture   . Abdominal pain, unspecified site   . Acute bronchitis   . Allergic rhinitis due to pollen   . CAD (coronary artery disease)   . Chronic kidney disease, stage II (mild)   . Essential hypertension, malignant   . GERD (gastroesophageal reflux disease)   . Headache(784.0)   . High cholesterol   . Memory loss 01/30/2013  . OSA (obstructive sleep apnea)    mod OSA '07  . Osteoarthrosis, unspecified whether generalized or localized, unspecified site   . PE (pulmonary embolism)    2005  . Pure hypercholesterolemia     Patient Active Problem List   Diagnosis Date Noted  . Bilateral impacted cerumen 03/11/2019  . Closed fracture of multiple pubic rami, right, sequela 08/13/2018  . Unspecified open wound, left foot, subsequent encounter   . AKI (acute kidney injury) ([place])    . Protein-calorie malnutrition, severe 07/26/2016  . Left foot infection 07/25/2016  . Cellulitis of left foot 07/25/2016  . Dementia ([place]) 07/25/2016  . DNR (do not resuscitate) 07/25/2016  . Normocytic anemia 07/25/2016  . Dissection of aorta, thoracic ([place]) 02/22/2016  . Hypertension 02/22/2016  . Tibial plateau fracture, left 02/22/2016  . Chronic kidney disease, stage 3 ([place]) 02/22/2016  . Left tibial fracture   . Memory loss 01/30/2013  . Unspecified sleep apnea 12/25/2012  . Headache(784.0) 12/25/2012    Past Surgical History:  Procedure Laterality Date  . CATARACT EXTRACTION W/PHACO  03/14/2012   Procedure: CATARACT EXTRACTION PHACO AND INTRAOCULAR LENS PLACEMENT (IOC);  Surgeon: Marylynn Pearson, MD;  Location: [facility];  Service: Ophthalmology;  Laterality: Left;  . EYE SURGERY  2010   cat ext right  . I & D EXTREMITY Left 07/29/2016   Procedure: Left Partial Calcaneus Excision;  Surgeon: Newt Minion, MD;  Location: [facility];  Service: Orthopedics;  Laterality: Left;  Marland Kitchen VASCULAR SURGERY  95? 97?   AAA     OB History   No obstetric history on file.     Family History  Problem Relation Age of Onset  . Anesthesia problems Neg Hx     Social History   Tobacco Use  . Smoking status: Former Smoker    Quit date: 10/20/1993    Years  since quitting: 27.3  . Smokeless tobacco: Never Used  Substance Use Topics  . Alcohol use: No  . Drug use: No    Home Medications Prior to Admission medications   Medication Sig Start Date End Date Taking? Authorizing Provider  acetaminophen (TYLENOL) 500 MG tablet Take 1,000 mg by mouth every 6 (six) hours as needed for mild pain or fever.    [provider]  amoxicillin-clavulanate (AUGMENTIN) 875-125 MG tablet Take 1 tablet by mouth 2 (two) times daily. One po bid x 7 days 01/24/21   Drenda Freeze, MD  aspirin 81 MG chewable tablet Chew by mouth daily.    [provider]  benazepril (LOTENSIN) 20 MG tablet Take 20  mg by mouth 2 (two) times daily. 05/06/16   [provider]  [employer] D3 50 MCG (2000 UT) CAPS Take 2,000 Units by mouth daily. 07/31/18   [provider]  doxazosin (CARDURA) 2 MG tablet Take 1 mg by mouth at bedtime.  11/26/18   [provider]  metoprolol succinate (TOPROL-XL) 100 MG 24 hr tablet Take 100 mg by mouth 2 (two) times daily. 11/05/18   [provider]  Multiple Vitamins-Minerals (ADULT GUMMY) CHEW Chew 1 tablet by mouth daily.    [provider]  MYRBETRIQ 50 MG TB24 tablet Take 50 mg by mouth daily. 02/05/16   [provider]  predniSONE (DELTASONE) 20 MG tablet Take 20 mg by mouth daily. 02/05/20   [provider]  simvastatin (ZOCOR) 40 MG tablet Take 40 mg by mouth at bedtime. 05/17/16   [provider]  traMADol (ULTRAM) 50 MG tablet Take 1 tablet (50 mg total) by mouth every 6 (six) hours as needed. 01/24/21   Drenda Freeze, MD    Allergies    Codeine, Morphine and related, and Other  Review of Systems   Review of Systems  Unable to perform ROS: Dementia    Physical Exam Updated Vital Signs BP 131/87   Pulse 85   Temp 98.2 F (36.8 C) (Oral)   Resp (!) 25   Ht 5\' 7"  (1.702 m)   Wt 57.6 kg   SpO2 93%   BMI 19.89 kg/m   Physical Exam Vitals and nursing note reviewed.  Constitutional:      General: She is not in acute distress.    Comments: Sitting upright in bed NAD  HENT:     Head: Normocephalic.     Comments: Severe facial bruising with indentation at the nasal bridge and swelling. Patient is mouth breathing. Ecchymosis appears to be old and encompasses majority of the face and neck.    Mouth/Throat:     Mouth: Mucous membranes are moist.  Eyes:     Extraocular Movements: Extraocular movements intact.     Pupils: Pupils are equal, round, and reactive to light.  Cardiovascular:     Rate and Rhythm: Normal rate and regular rhythm.     Pulses: Normal pulses.  Pulmonary:     Effort:  Pulmonary effort is normal.     Breath sounds: Normal breath sounds.  Abdominal:     General: There is no distension.     Tenderness: There is no abdominal tenderness.  Musculoskeletal:        General: No swelling or tenderness.     Cervical back: Neck supple.  Skin:    General: Skin is warm.     Capillary Refill: Capillary refill takes less than 2 seconds.  Neurological:  Mental Status: She is alert.     Comments: Exam is limited by the patient's dementia and facial swelling however she does not have any obvious droop.  Speech seems dysarthric however patient is also edentulous. Patient follows commands.  She answers yes and no questions. She is able to tell me that she normally uses a walker but could not walk today. She has grip strength weakness on the right side and is unable to move either lower extremity.      ED Results / Procedures / Treatments   Labs (all labs ordered are listed, but only abnormal results are displayed) Labs Reviewed - No data to display  EKG EKG Interpretation  Date/Time:  Monday February 01 2021 12:32:41 EDT Ventricular Rate:  85 PR Interval:    QRS Duration: 107 QT Interval:  392 QTC Calculation: 467 R Axis:   -35 Text Interpretation: Sinus rhythm Abnormal R-wave progression, early transition Left ventricular hypertrophy No significant change since last tracing Confirmed by Lennice Sites [phone]) on 02/01/2021 12:36:51 PM   Radiology No results found.  Procedures Procedures   Medications Ordered in ED Medications - No data to display  ED Course  I have reviewed the triage vital signs and the nursing notes.  Pertinent labs & imaging results that were available during my care of the patient were reviewed by me and considered in my medical decision making (see chart for details).  Daughter at bedside and verifies that the patient went to bed normal at 10;30 PM. She states that she is normally ambulatory at baseline with a walker and that  this morning she had trouble moving her mouth or getting words out, had a facial droop and seemed weak on the R side. I informed her that the patient was being evaluated by neurology for potential stroke at this time. Labs and imaging inititated   Patient seen by myself and Dr. Ronnald Nian,- seen by Dr. Lorrin Goodell      MDM Rules/Calculators/A&P                          Patient here with equivocal facial droop, right hand weakness on my examination inability to use either of her lower extremities.  Exam is made very difficult by the fact that the patient has significant dementia.  On my exam she also seems to be wincing with pain on occasion however every time I ask her if she is hurting she says no. I ordered and reviewed labs that include CBC which shows normocytic anemia, CMP with creatinine of 2.29 up from of 1.63, 8 days ago.  PT/INR, APTT, Covid panel, UDS, ammonia level negative for acute abnormality.  I ordered and reviewed a CT head for code stroke which shows no acute abnormalities. Dr. Lorrin Goodell consulted and recommends admission for TIA as he feels her sxs are resolving. Patient case discussed with Dr. Berle Mull who will admit the patient. Final Clinical Impression(s) / ED Diagnoses Final diagnoses:  None    Rx / DC Orders ED Discharge Orders    None       Margarita Mail, PA-C 02/03/21 [place], [place], DO 02/04/21 [phone]

## 2021-02-01 NOTE — H&P (Signed)
[facility] History and Physical   Patient: Alexandria Walton YSA:[id number]   PCP: Rogers Blocker, MD DOB: August 27, 1929   DOA: 02/01/2021   DOS: 02/01/2021   DOS: the patient was seen and examined on 02/01/2021  Patient coming from: The patient is coming from Home  Chief Complaint: Right facial droop and weakness  HPI: Alexandria Walton is a 87 y.o. female with Past medical history of HTN, CKD 3B, CAD, GERD, OSA, HLD, AAA.  Presents with complaints of right-sided weakness started this morning. Patient was at her baseline last night around 11 PM. When she woke up she started having complaints of slurred speech.  This was noticed by the daughter first.  Daughter further evaluate the patient and found that the patient actually appears to have some right-sided facial droop, right-sided weakness.  Patient was unable to grasp anything with her hand unable to drink anything with a straw. There is also some confusion and fatigue. Since symptoms did not improve daughter called EMS and patient was brought to the ER. At the time of my evaluation speech improved significantly close to baseline. Right-sided facial droop still present per daughter.  Confusion improved and patient appears to be back to baseline from that point of view. Patient denies any complaints of chest pain abdominal pain nausea or vomiting. No diarrhea reported actually constipation reported by the daughter. Patient reports bilateral numbness below the knee which she noticed since her recent fall. Patient was recently seen in ER on 3/19 after an unwitnessed fall.  Basic work-up showed nasal bone fracture.  Patient was prescribed Augmentin and recommended to have an ENT follow-up.  Daughter denies any prior syncopal events.  No loss of control of bowel or bladder reported.  At the time of the fall patient was coherent and was not confused and did not lose consciousness. No recent change in medication reported by the family.  ED Course:  Presented as a code stroke. CT head unremarkable. Neurology was consulted. Recommended admission for further work-up.  Review of Systems: as mentioned in the history of present illness.  All other systems reviewed and are negative.  Past Medical History:  Diagnosis Date  . Abdominal aneurysm without mention of rupture   . Abdominal pain, unspecified site   . Acute bronchitis   . Allergic rhinitis due to pollen   . CAD (coronary artery disease)   . Chronic kidney disease, stage II (mild)   . Essential hypertension, malignant   . GERD (gastroesophageal reflux disease)   . Headache(784.0)   . High cholesterol   . Memory loss 01/30/2013  . OSA (obstructive sleep apnea)    mod OSA '07  . Osteoarthrosis, unspecified whether generalized or localized, unspecified site   . PE (pulmonary embolism)    2005  . Pure hypercholesterolemia    Past Surgical History:  Procedure Laterality Date  . CATARACT EXTRACTION W/PHACO  03/14/2012   Procedure: CATARACT EXTRACTION PHACO AND INTRAOCULAR LENS PLACEMENT (IOC);  Surgeon: Marylynn Pearson, MD;  Location: [place];  Service: Ophthalmology;  Laterality: Left;  . EYE SURGERY  2010   cat ext right  . I & D EXTREMITY Left 07/29/2016   Procedure: Left Partial Calcaneus Excision;  Surgeon: Newt Minion, MD;  Location: [place];  Service: Orthopedics;  Laterality: Left;  Marland Kitchen VASCULAR SURGERY  95? 97?   AAA   Social History:  reports that she quit smoking about 27 years ago. She has never used smokeless tobacco. She reports that she  does not drink alcohol and does not use drugs.  Allergies  Allergen Reactions  . Codeine Nausea Only  . Morphine And Related Nausea Only  . Other Nausea Only    Family history reviewed and not pertinent Family History  Problem Relation Age of Onset  . Anesthesia problems Neg Hx      Prior to Admission medications   Medication Sig Start Date End Date Taking? Authorizing Provider  amoxicillin-clavulanate (AUGMENTIN) 875-125  MG tablet Take 1 tablet by mouth 2 (two) times daily. One po bid x 7 days 01/24/21  Yes Drenda Freeze, MD  aspirin 81 MG chewable tablet Chew by mouth daily.   Yes [provider]  benazepril (LOTENSIN) 20 MG tablet Take 20 mg by mouth 2 (two) times daily. 05/06/16  Yes [provider]  [employer] D3 50 MCG (2000 UT) CAPS Take 2,000 Units by mouth daily. 07/31/18  Yes [provider]  metoprolol succinate (TOPROL-XL) 100 MG 24 hr tablet Take 100 mg by mouth 2 (two) times daily. 11/05/18  Yes [provider]  Multiple Vitamins-Minerals (ADULT GUMMY) CHEW Chew 1 tablet by mouth daily.   Yes [provider]  traMADol (ULTRAM) 50 MG tablet Take 1 tablet (50 mg total) by mouth every 6 (six) hours as needed. Patient taking differently: Take 50 mg by mouth every 6 (six) hours as needed for moderate pain. 01/24/21  Yes Drenda Freeze, MD    Physical Exam: Vitals:   02/01/21 1515 02/01/21 1545 02/01/21 1600 02/01/21 1615  BP: (!) 171/83 (!) 165/86 (!) 166/90 (!) 177/85  Pulse: 76 73 66 65  Resp: (!) _0 (!) 25  Temp:      TempSrc:      SpO2: 99% 98% 95% 100%  Weight:      Height:        General: alert and oriented to time, place, and person. Appear in mild distress, affect appropriate Eyes: PERRL, Conjunctiva normal ENT: Oral Mucosa Clear, moist  Neck: no JVD, no Abnormal Mass Or lumps Cardiovascular: S1 and S2 Present, aortic systolic  Murmur, peripheral pulses symmetrical Respiratory: good respiratory effort, Bilateral Air entry equal and Decreased, no signs of accessory muscle use, Clear to Auscultation, no Crackles, no wheezes Abdomen: Bowel Sound present, Soft and no tenderness Skin: no rashes facial bruising in single stages of healing. Extremities: no Pedal edema, no calf tenderness Neurologic: mental status, alert and oriented x3, speech normal, PERLA, Motor strength 5/5 and symmetric, Sensation grossly normal to light touch and Reflex  difficult to assess no dysarthria.  Right-sided facial droop noted. Gait not checked due to patient safety concerns  Data Reviewed: I have personally reviewed and interpreted labs, imaging as discussed below.  CBC: Recent Labs  Lab 02/01/21 1243 02/01/21 1252  WBC 9.3  --   NEUTROABS 6.2  --   HGB 10.9* 11.6*  HCT 34.1* 34.0*  MCV 94.5  --   PLT 204  --    Basic Metabolic Panel: Recent Labs  Lab 02/01/21 1243 02/01/21 1252  NA 141 143  K 4.6 4.6  CL 108 108  CO2 26  --   GLUCOSE 94 90  BUN 47* 50*  CREATININE 2.29* 2.30*  CALCIUM 9.6  --    GFR: Estimated Creatinine Clearance: 15.1 mL/min (A) (by C-G formula based on SCr of 2.3 mg/dL (H)). Liver Function Tests: Recent Labs  Lab 02/01/21 1243  AST 40  ALT 41  ALKPHOS 90  BILITOT 0.9  PROT 7.2  ALBUMIN 3.4*   No results for input(s): LIPASE, AMYLASE in the last 168 hours. Recent Labs  Lab 02/01/21 1344  AMMONIA 29   Coagulation Profile: Recent Labs  Lab 02/01/21 1243  INR 1.2   Cardiac Enzymes: No results for input(s): CKTOTAL, CKMB, CKMBINDEX, TROPONINI in the last 168 hours. BNP (last 3 results) No results for input(s): PROBNP in the last 8760 hours. HbA1C: No results for input(s): HGBA1C in the last 72 hours. CBG: No results for input(s): GLUCAP in the last 168 hours. Lipid Profile: No results for input(s): CHOL, HDL, LDLCALC, TRIG, CHOLHDL, LDLDIRECT in the last 72 hours. Thyroid Function Tests: No results for input(s): TSH, T4TOTAL, FREET4, T3FREE, THYROIDAB in the last 72 hours. Anemia Panel: No results for input(s): VITAMINB12, FOLATE, FERRITIN, TIBC, IRON, RETICCTPCT in the last 72 hours. Urine analysis:    Component Value Date/Time   COLORURINE STRAW (A) 02/12/2020 1421   APPEARANCEUR CLEAR 02/12/2020 1421   LABSPEC 1.008 02/12/2020 1421   PHURINE 8.0 02/12/2020 1421   GLUCOSEU 50 (A) 02/12/2020 1421   HGBUR SMALL (A) 02/12/2020 1421   BILIRUBINUR NEGATIVE 02/12/2020 1421    KETONESUR NEGATIVE 02/12/2020 1421   PROTEINUR NEGATIVE 02/12/2020 1421   UROBILINOGEN 0.2 08/09/2013 1210   NITRITE NEGATIVE 02/12/2020 1421   LEUKOCYTESUR TRACE (A) 02/12/2020 1421    Radiological Exams on Admission: DG CHEST PORT 1 VIEW  Result Date: 02/01/2021 CLINICAL DATA:  Chest pain EXAM: PORTABLE CHEST 1 VIEW COMPARISON:  04/15/2020, 11/28/2018 FINDINGS: No focal opacity or pleural effusion. There is mild cardiomegaly. Suspected increased size of aneurysmal dilatation and known dissection at the aortic arch with transverse arch diameter of approximate 6.8 cm, compared with 5.8 cm on prior radiograph. IMPRESSION: No acute pulmonary infiltrate. Mild cardiomegaly. Suspected increased size of aneurysmal dilatation at the aortic arch; patient has known history of type a dissection. Given significant increase in size/change in appearance, suggest follow-up CT angiography. Critical Value/emergent results were called by telephone at the time of interpretation on 02/01/2021 at 6:06 pm to provider [facility]  , who verbally acknowledged these results. Electronically Signed   By: Donavan Foil M.D.   On: 02/01/2021 18:06   DG Abd Portable 1V  Result Date: 02/01/2021 CLINICAL DATA:  Constipated EXAM: PORTABLE ABDOMEN - 1 VIEW COMPARISON:  CT 02/22/2016 FINDINGS: Diffusely calcified aorta. Nonobstructed gas pattern with moderate stool in the colon. Phleboliths in the pelvis. Advanced degenerative changes of both hips. IMPRESSION: Nonobstructed gas pattern with moderate stool in the colon. Electronically Signed   By: Donavan Foil M.D.   On: 02/01/2021 17:48   CT HEAD CODE STROKE WO CONTRAST  Result Date: 02/01/2021 CLINICAL DATA:  Code stroke.  Altered mental status EXAM: CT HEAD WITHOUT CONTRAST TECHNIQUE: Contiguous axial images were obtained from the base of the skull through the vertex without intravenous contrast. COMPARISON:  01/23/2021 FINDINGS: Brain: There is no acute intracranial hemorrhage  or mass effect. No new loss of gray-white differentiation. Chronic infarcts of the left basal ganglia and adjacent white matter and right insula. Additional patchy and confluent areas of hypoattenuation in the supratentorial white matter likely reflects stable advanced chronic microvascular ischemic changes. Prominence of the ventricles and sulci reflects stable parenchymal volume loss. No extra-axial collection. Vascular: No hyperdense vessel. There is intracranial atherosclerotic calcification at the skull base. Skull: Unremarkable. Sinuses/Orbits: No acute abnormality. Other: Mastoid air cells are clear. ASPECTS [facility] Stroke Program Early CT Score) - Ganglionic level infarction (caudate, lentiform nuclei, internal capsule,  insula, M1-M3 cortex): 7 - Supraganglionic infarction (M4-M6 cortex): 3 Total score (0-10 with 10 being normal): 10 IMPRESSION: There is no acute intracranial hemorrhage or evidence of acute infarction. ASPECT score is 10. Stable chronic findings detailed above. These results were communicated to Dr. Lorrin Goodell at 1:06 pm on 02/01/2021 by text page via the [facility] messaging system. Electronically Signed   By: Macy Mis M.D.   On: 02/01/2021 13:09   EKG: Independently reviewed. normal sinus rhythm, nonspecific ST and T waves changes. Echocardiogram: ordered  I reviewed all nursing notes, pharmacy notes, vitals, pertinent old records.  Assessment/Plan 1.  TIA. CT head negative for stroke. Outside of TPA window. Not a candidate for thrombectomy. Has some baseline dementia but appears to b be alert awake and oriented. Only residual right-sided facial droop.  No other acute residual weakness. We will perform MRI brain without contrast. Check MRA as well as carotid Doppler. Echocardiogram ordered. Check lipid panel tomorrow. Continue 81 mg aspirin. Patient passed EL swallow screen.  Will initiate dysphagia 3 diet. PT OT and speech consulted. Monitor on telemetry. No  evidence of confusion right now therefore will not perform further encephalopathy work-up.  2.  Syncope. Unwitnessed fall. Nasal fracture. Left base of neck pain. On 3/19 patient had a fall unwitnessed. CT scan of the head was unremarkable. Patient had nasal fracture and was started on Augmentin and was recommended to follow-up with ENT. Patient does not appear to have any prior fall.  No prodrome.  Was not confused and did not lose consciousness. Patient will benefit from 30-day monitoring. Follow-up on stroke work-up which will also be syncope work-up. Get CT neck C-spine for neck pain. Continue Augmentin.  3.  AKI on CKD 3B. Baseline serum creatinine 1.4.  EGFR 33. On presentation serum creatinine 2.29. eGFR 20. We will provide IV fluids.  Monitor renal function. Likely from poor p.o. intake renal etiology.  If no improvement will require further work-up.  Avoid contrast and avoid nephrotoxic medications. Recommend to discontinue benazepril permanently.  4.  Essential hypertension. Blood pressure elevated right now we will allow permissive hypertension.  Hold medication.  5.  Cough. Chest x-ray negative for any acute infiltrate. There is increase in the size of the aortic arch.  Will perform further work-up  6.  History of type a thoracic aortic arch dissection Seen on CT scan on April 2017. Chest x-ray shows widening of the arch today. We will get CT of the chest without contrast. Further work-up depending on renal function as well as goals of care discussion with the family.  7.  Constipation. We will get x-ray abdomen.   Nutrition: Dysphagia 3 diet. DVT Prophylaxis: Subcutaneous Heparin   Advance goals of care discussion: DNR daughter at bedside, confirmed.  Consults: Neurology.  Family Communication: family was present at bedside, at the time of interview.  Opportunity was given to ask question and all questions were answered satisfactorily.   Disposition:   From: Home Likely will need Home health on discharge.   Author: Berle Mull, MD [facility] 02/01/2021 6:24 PM   To reach On-call, see care teams to locate the attending and reach out to them via [email or web address]. If 7PM-7AM, please contact night-coverage If you still have difficulty reaching the attending provider, please page the [facility] (Director on Call) for [facility] on amion for assistance.

## 2021-02-01 NOTE — ED Notes (Signed)
RN attempted report x1.  

## 2021-02-01 NOTE — Progress Notes (Addendum)
Cross-coverage note:   Notified by ED PA of C2 fracture and thoracic aortic aneurysm with concern for enlarging dissection.    Patient is an 87 year old female with history of CKD IIIb,, hypertension, hyperlipidemia, and thoracic aortic aneurysm who was seen in the emergency department on 01/23/2021 after a fall, found to have displaced acute nasal bone fractures and age-indeterminate fractures involving left orbital floor, right lamina Propecia, and left zygomatic arch, was discharged home with Augmentin and ENT follow-up, and now returns with increased weakness, facial droop, speech difficulty, and ongoing pain in her neck posteriorly.  CT head was negative for acute findings, she was found to have acute kidney injury, had widening of the aortic arch on chest x-ray, was seen by neurology, and admitted to the hospitalist service for ongoing evaluation and management.  CT cervical spine and CT chest were performed and concerning for acute type III C2 fracture and dilatation of the distal transverse aortic arch and proximal descending thoracic aorta which has significantly enlarged since 2017 and likely represents enlarging dissection.  Patient complains of pain for the past week, primarily at the base of her neck posteriorly and about her midface.   She has awake, alert, in no respiratory distress, and noted to have bilateral periorbital and midface ecchymosis.she is oriented to location, knows the month but not year, and knows her birth month and day but has trouble with the year.  Patient reports that she lives with her daughter Daneen Schick and asked that I call her.  Discussed the CT findings with her daughter, Daneen Schick, who was with the patient when she saw CT surgeon Dr. Servando Snare several years ago for the thoracic aortic aneurysm and understands that the patient was not a candidate for surgical repair.  Josie understands that there has been significant interval worsening in the aneurysm and agrees with  conservative management.  Dr. Ellene Route of neurosurgery kindly discussed the case, agrees with cervical collar for now, plans to personally review the CT scans, and will provide additional recommendations either tonight or tomorrow after he reviews the imaging.   Radiology had recommended CT angiography for further evaluation of thoracic aorta, but GFR is 20. Discussed this radiology and MR angiography of the chest without contrast was recommended.   For now, plan for cervical collar, check BP in bilateral arms, lower heart rate and blood pressure with metoprolol, control pain, check MRA chest.

## 2021-02-01 NOTE — Progress Notes (Signed)
EEG complete - results pending 

## 2021-02-02 ENCOUNTER — Inpatient Hospital Stay (HOSPITAL_COMMUNITY): Payer: Medicare Other

## 2021-02-02 ENCOUNTER — Observation Stay (HOSPITAL_COMMUNITY): Payer: Medicare Other

## 2021-02-02 DIAGNOSIS — K5903 Drug induced constipation: Secondary | ICD-10-CM

## 2021-02-02 DIAGNOSIS — G459 Transient cerebral ischemic attack, unspecified: Secondary | ICD-10-CM

## 2021-02-02 DIAGNOSIS — R471 Dysarthria and anarthria: Secondary | ICD-10-CM | POA: Diagnosis present

## 2021-02-02 DIAGNOSIS — W19XXXD Unspecified fall, subsequent encounter: Secondary | ICD-10-CM | POA: Diagnosis present

## 2021-02-02 DIAGNOSIS — M47812 Spondylosis without myelopathy or radiculopathy, cervical region: Secondary | ICD-10-CM | POA: Diagnosis present

## 2021-02-02 DIAGNOSIS — Z66 Do not resuscitate: Secondary | ICD-10-CM | POA: Diagnosis present

## 2021-02-02 DIAGNOSIS — I129 Hypertensive chronic kidney disease with stage 1 through stage 4 chronic kidney disease, or unspecified chronic kidney disease: Secondary | ICD-10-CM | POA: Diagnosis present

## 2021-02-02 DIAGNOSIS — G4733 Obstructive sleep apnea (adult) (pediatric): Secondary | ICD-10-CM | POA: Diagnosis present

## 2021-02-02 DIAGNOSIS — R55 Syncope and collapse: Secondary | ICD-10-CM | POA: Diagnosis not present

## 2021-02-02 DIAGNOSIS — N1832 Chronic kidney disease, stage 3b: Secondary | ICD-10-CM | POA: Diagnosis present

## 2021-02-02 DIAGNOSIS — K219 Gastro-esophageal reflux disease without esophagitis: Secondary | ICD-10-CM | POA: Diagnosis present

## 2021-02-02 DIAGNOSIS — I7101 Dissection of thoracic aorta: Secondary | ICD-10-CM | POA: Diagnosis present

## 2021-02-02 DIAGNOSIS — Z87891 Personal history of nicotine dependence: Secondary | ICD-10-CM | POA: Diagnosis not present

## 2021-02-02 DIAGNOSIS — E785 Hyperlipidemia, unspecified: Secondary | ICD-10-CM | POA: Diagnosis present

## 2021-02-02 DIAGNOSIS — Z681 Body mass index (BMI) 19 or less, adult: Secondary | ICD-10-CM | POA: Diagnosis not present

## 2021-02-02 DIAGNOSIS — E46 Unspecified protein-calorie malnutrition: Secondary | ICD-10-CM | POA: Diagnosis present

## 2021-02-02 DIAGNOSIS — K5900 Constipation, unspecified: Secondary | ICD-10-CM | POA: Diagnosis present

## 2021-02-02 DIAGNOSIS — Z20822 Contact with and (suspected) exposure to covid-19: Secondary | ICD-10-CM | POA: Diagnosis present

## 2021-02-02 DIAGNOSIS — R29810 Facial weakness: Secondary | ICD-10-CM | POA: Diagnosis present

## 2021-02-02 DIAGNOSIS — I714 Abdominal aortic aneurysm, without rupture: Secondary | ICD-10-CM | POA: Diagnosis present

## 2021-02-02 DIAGNOSIS — I2510 Atherosclerotic heart disease of native coronary artery without angina pectoris: Secondary | ICD-10-CM | POA: Diagnosis present

## 2021-02-02 DIAGNOSIS — R29705 NIHSS score 5: Secondary | ICD-10-CM | POA: Diagnosis present

## 2021-02-02 DIAGNOSIS — F0390 Unspecified dementia without behavioral disturbance: Secondary | ICD-10-CM | POA: Diagnosis present

## 2021-02-02 DIAGNOSIS — N170 Acute kidney failure with tubular necrosis: Secondary | ICD-10-CM | POA: Diagnosis present

## 2021-02-02 DIAGNOSIS — S12112D Nondisplaced Type II dens fracture, subsequent encounter for fracture with routine healing: Secondary | ICD-10-CM | POA: Diagnosis not present

## 2021-02-02 DIAGNOSIS — R4182 Altered mental status, unspecified: Secondary | ICD-10-CM | POA: Diagnosis present

## 2021-02-02 DIAGNOSIS — S022XXD Fracture of nasal bones, subsequent encounter for fracture with routine healing: Secondary | ICD-10-CM | POA: Diagnosis not present

## 2021-02-02 DIAGNOSIS — E7800 Pure hypercholesterolemia, unspecified: Secondary | ICD-10-CM | POA: Diagnosis present

## 2021-02-02 LAB — CBC WITH DIFFERENTIAL/PLATELET
Abs Immature Granulocytes: 0.03 10*3/uL (ref 0.00–0.07)
Basophils Absolute: 0.1 10*3/uL (ref 0.0–0.1)
Basophils Relative: 1 %
Eosinophils Absolute: 0.3 10*3/uL (ref 0.0–0.5)
Eosinophils Relative: 3 %
HCT: 33.8 % — ABNORMAL LOW (ref 36.0–46.0)
Hemoglobin: 10.5 g/dL — ABNORMAL LOW (ref 12.0–15.0)
Immature Granulocytes: 0 %
Lymphocytes Relative: 23 %
Lymphs Abs: 2.1 10*3/uL (ref 0.7–4.0)
MCH: 29.4 pg (ref 26.0–34.0)
MCHC: 31.1 g/dL (ref 30.0–36.0)
MCV: 94.7 fL (ref 80.0–100.0)
Monocytes Absolute: 0.9 10*3/uL (ref 0.1–1.0)
Monocytes Relative: 9 %
Neutro Abs: 5.8 10*3/uL (ref 1.7–7.7)
Neutrophils Relative %: 64 %
Platelets: 194 10*3/uL (ref 150–400)
RBC: 3.57 MIL/uL — ABNORMAL LOW (ref 3.87–5.11)
RDW: 13.6 % (ref 11.5–15.5)
WBC: 9.1 10*3/uL (ref 4.0–10.5)
nRBC: 0 % (ref 0.0–0.2)

## 2021-02-02 LAB — RAPID URINE DRUG SCREEN, HOSP PERFORMED
Amphetamines: NOT DETECTED
Barbiturates: NOT DETECTED
Benzodiazepines: NOT DETECTED
Cocaine: NOT DETECTED
Opiates: NOT DETECTED
Tetrahydrocannabinol: NOT DETECTED

## 2021-02-02 LAB — HEMOGLOBIN A1C
Hgb A1c MFr Bld: 5.3 % (ref 4.8–5.6)
Mean Plasma Glucose: 105.41 mg/dL

## 2021-02-02 LAB — COMPREHENSIVE METABOLIC PANEL
ALT: 32 U/L (ref 0–44)
AST: 30 U/L (ref 15–41)
Albumin: 3.3 g/dL — ABNORMAL LOW (ref 3.5–5.0)
Alkaline Phosphatase: 85 U/L (ref 38–126)
Anion gap: 9 (ref 5–15)
BUN: 41 mg/dL — ABNORMAL HIGH (ref 8–23)
CO2: 23 mmol/L (ref 22–32)
Calcium: 9.3 mg/dL (ref 8.9–10.3)
Chloride: 108 mmol/L (ref 98–111)
Creatinine, Ser: 1.78 mg/dL — ABNORMAL HIGH (ref 0.44–1.00)
GFR, Estimated: 27 mL/min — ABNORMAL LOW (ref >60)
Glucose, Bld: 82 mg/dL (ref 70–99)
Potassium: 4.5 mmol/L (ref 3.5–5.1)
Sodium: 140 mmol/L (ref 135–145)
Total Bilirubin: 1.1 mg/dL (ref 0.3–1.2)
Total Protein: 7 g/dL (ref 6.5–8.1)

## 2021-02-02 LAB — ECHOCARDIOGRAM COMPLETE
AR max vel: 2.02 cm2
AV Area VTI: 2.34 cm2
AV Area mean vel: 2.52 cm2
AV Mean grad: 11 mmHg
AV Peak grad: 18.8 mmHg
Ao pk vel: 2.17 m/s
Area-P 1/2: 2.33 cm2
Height: 67 in
MV M vel: 5.27 m/s
MV Peak grad: 111.2 mmHg
P 1/2 time: 436 msec
S' Lateral: 3.8 cm
Single Plane A4C EF: 60.6 %
Weight: 1940.05 oz

## 2021-02-02 LAB — LIPID PANEL
Cholesterol: 223 mg/dL — ABNORMAL HIGH (ref 0–200)
HDL: 52 mg/dL (ref >40)
LDL Cholesterol: 144 mg/dL — ABNORMAL HIGH (ref 0–99)
Total CHOL/HDL Ratio: 4.3 RATIO
Triglycerides: 137 mg/dL (ref <150)
VLDL: 27 mg/dL (ref 0–40)

## 2021-02-02 LAB — URINALYSIS, ROUTINE W REFLEX MICROSCOPIC
Bacteria, UA: NONE SEEN
Bilirubin Urine: NEGATIVE
Glucose, UA: NEGATIVE mg/dL
Ketones, ur: 5 mg/dL — AB
Nitrite: NEGATIVE
Protein, ur: NEGATIVE mg/dL
Specific Gravity, Urine: 1.021 (ref 1.005–1.030)
pH: 5 (ref 5.0–8.0)

## 2021-02-02 MED ORDER — POLYETHYLENE GLYCOL 3350 17 G PO PACK
17.0000 g | PACK | Freq: Two times a day (BID) | ORAL | Status: DC
  Administered 2021-02-02 – 2021-02-04 (×5): 17 g via ORAL
  Filled 2021-02-02 (×5): qty 1

## 2021-02-02 MED ORDER — ASPIRIN EC 81 MG PO TBEC
81.0000 mg | DELAYED_RELEASE_TABLET | Freq: Every day | ORAL | Status: DC
  Administered 2021-02-02 – 2021-02-04 (×3): 81 mg via ORAL
  Filled 2021-02-02 (×3): qty 1

## 2021-02-02 MED ORDER — METOPROLOL SUCCINATE ER 100 MG PO TB24: 100.0000 mg | ORAL_TABLET | Freq: Two times a day (BID) | ORAL | Status: DC

## 2021-02-02 MED ORDER — HALOPERIDOL LACTATE 5 MG/ML IJ SOLN: 1.0000 mg | Freq: Four times a day (QID) | INTRAMUSCULAR | Status: DC | PRN

## 2021-02-02 MED ORDER — SODIUM CHLORIDE 0.9 % IV SOLN: INTRAVENOUS | Status: AC

## 2021-02-02 MED ORDER — BENAZEPRIL HCL 20 MG PO TABS
20.0000 mg | ORAL_TABLET | Freq: Two times a day (BID) | ORAL | Status: DC
  Administered 2021-02-02 – 2021-02-04 (×5): 20 mg via ORAL
  Filled 2021-02-02 (×5): qty 1

## 2021-02-02 MED ORDER — METOPROLOL TARTRATE 5 MG/5ML IV SOLN
5.0000 mg | INTRAVENOUS | Status: DC | PRN
  Administered 2021-02-02: 5 mg via INTRAVENOUS

## 2021-02-02 MED ORDER — TRAMADOL HCL 50 MG PO TABS
50.0000 mg | ORAL_TABLET | Freq: Four times a day (QID) | ORAL | Status: DC | PRN
  Administered 2021-02-02: 25 mg via ORAL
  Filled 2021-02-02: qty 1

## 2021-02-02 MED ORDER — AMOXICILLIN-POT CLAVULANATE 500-125 MG PO TABS
1.0000 | ORAL_TABLET | Freq: Two times a day (BID) | ORAL | Status: AC
  Administered 2021-02-02 – 2021-02-04 (×5): 500 mg via ORAL
  Filled 2021-02-02 (×5): qty 1

## 2021-02-02 NOTE — NC FL2 (Addendum)
[place] LEVEL OF CARE SCREENING TOOL     IDENTIFICATION  Patient Name: Alexandria Walton Birthdate: 10-04-1929 Sex: female Admission Date (Current Location): 02/01/2021  [facility] and [place] Number:  Herbalist and Address:  The . [facility], [place] [address], [place], [place] [address]      Provider Number: [id number]  Attending Physician Name and Address:  Charlynne Cousins, MD  Relative Name and Phone Number:       Current Level of Care: Hospital Recommended Level of Care: [place] Prior Approval Number:    Date Approved/Denied:   PASRR Number: [id number]  Discharge Plan: SNF    Current Diagnoses: Patient Active Problem List   Diagnosis Date Noted  . TIA (transient ischemic attack) 02/01/2021  . Bilateral impacted cerumen 03/11/2019  . Closed fracture of multiple pubic rami, right, sequela 08/13/2018  . Unspecified open wound, left foot, subsequent encounter   . AKI (acute kidney injury) ([place])   . Protein-calorie malnutrition, severe 07/26/2016  . Left foot infection 07/25/2016  . Cellulitis of left foot 07/25/2016  . Dementia ([place]) 07/25/2016  . DNR (do not resuscitate) 07/25/2016  . Normocytic anemia 07/25/2016  . Dissection of aorta, thoracic ([place]) 02/22/2016  . Hypertension 02/22/2016  . Tibial plateau fracture, left 02/22/2016  . Chronic kidney disease, stage 3 ([place]) 02/22/2016  . Left tibial fracture   . Memory loss 01/30/2013  . Unspecified sleep apnea 12/25/2012  . Headache(784.0) 12/25/2012    Orientation RESPIRATION BLADDER Height & Weight     Self,Situation,Place  Normal Incontinent Weight: 121 lb 4.1 oz (55 kg) Height:  5\' 7"  (170.2 cm)  BEHAVIORAL SYMPTOMS/MOOD NEUROLOGICAL BOWEL NUTRITION STATUS      Continent Diet (See DC Summary)  AMBULATORY STATUS COMMUNICATION OF NEEDS Skin   Limited Assist Verbally Normal                       Personal Care Assistance Level of  Assistance  Bathing,Feeding,Dressing Bathing Assistance: Limited assistance Feeding assistance: Limited assistance Dressing Assistance: Limited assistance     Functional Limitations Info  Sight,Hearing,Speech Sight Info: Adequate Hearing Info: Adequate Speech Info: Adequate    SPECIAL CARE FACTORS FREQUENCY  PT (By licensed PT),OT (By licensed OT)     PT Frequency: 5x/week OT Frequency: 5x/week            Contractures Contractures Info: Not present    Additional Factors Info  Code Status,Allergies Code Status Info: DNR Allergies Info: Codeine, Morphine and Related           Current Medications (02/02/2021):  This is the current hospital active medication list Current Facility-Administered Medications  Medication Dose Route Frequency Provider Last Rate Last Admin  .  stroke: mapping our early stages of recovery book   Does not apply Once Lavina Hamman, MD      . 0.9 %  sodium chloride infusion   Intravenous Continuous Charlynne Cousins, MD 75 mL/hr at 02/02/21 1022 Other (enter comment in med admin window) at 02/02/21 1022  . acetaminophen (TYLENOL) tablet 650 mg  650 mg Oral Q4H PRN Lavina Hamman, MD   650 mg at 02/02/21 0239   Or  . acetaminophen (TYLENOL) 160 MG/5ML solution 650 mg  650 mg Per Tube Q4H PRN Lavina Hamman, MD       Or  . acetaminophen (TYLENOL) suppository 650 mg  650 mg Rectal Q4H PRN Lavina Hamman, MD      .  amoxicillin-clavulanate (AUGMENTIN) 500-125 MG per tablet 500 mg  1 tablet Oral BID Charlynne Cousins, MD   500 mg at 02/02/21 1031  . aspirin EC tablet 81 mg  81 mg Oral Daily Charlynne Cousins, MD   81 mg at 02/02/21 1031  . benazepril (LOTENSIN) tablet 20 mg  20 mg Oral BID Charlynne Cousins, MD   20 mg at 02/02/21 1031  . docusate sodium (COLACE) capsule 100 mg  100 mg Oral BID Lavina Hamman, MD   100 mg at 02/02/21 1031  . fentaNYL (SUBLIMAZE) injection 12.5-25 mcg  12.5-25 mcg Intravenous Q2H PRN Opyd, Ilene Qua, MD    12.5 mcg at 02/01/21 2059  . haloperidol lactate (HALDOL) injection 1 mg  1 mg Intravenous Q6H PRN Charlynne Cousins, MD      . heparin injection 5,000 Units  5,000 Units Subcutaneous Q8H Lavina Hamman, MD   5,000 Units at 02/02/21 1432  . metoprolol succinate (TOPROL-XL) 24 hr tablet 100 mg  100 mg Oral BID Opyd, Ilene Qua, MD   100 mg at 02/02/21 1031  . metoprolol tartrate (LOPRESSOR) injection 5 mg  5 mg Intravenous Q2H PRN Opyd, Ilene Qua, MD   5 mg at 02/02/21 0239  . polyethylene glycol (MIRALAX / GLYCOLAX) packet 17 g  17 g Oral BID Charlynne Cousins, MD   17 g at 02/02/21 1032  . traMADol (ULTRAM) tablet 50 mg  50 mg Oral Q6H PRN Charlynne Cousins, MD   25 mg at 02/02/21 1432     Discharge Medications: Please see discharge summary for a list of discharge medications.  Relevant Imaging Results:  Relevant Lab Results:   Additional Information SSN: [id number]  Marney Setting, Student-Social Work

## 2021-02-02 NOTE — Plan of Care (Signed)
  Problem: Pain Managment: Goal: General experience of comfort will improve Outcome: Progressing   

## 2021-02-02 NOTE — Evaluation (Signed)
Occupational Therapy Evaluation Patient Details Name: Alexandria Walton MRN: [id number] DOB: [birth year] Today's Date: 02/02/2021    History of Present Illness Pt is 87 yo female who presents with R sided weakness. Pt also with recent fall and subsequent numbness below the knees. Pt with nasal bone fx and found to have C2 fx as well. MRI neg for acute changes. PMH: HTN, CKD3, CAD, GERD, OSA, HLD, AAA, DM.   Clinical Impression   Patient admitted for above and limited by problem list below, including impaired balance, cervical precautions, generalized weakness, decreased activity tolerance.  Patient reports using RW for mobility and needing some assist for ADLs, her daughter lives with her and assists as needed.  She requires max assist +2 for bed mobility, mod assist +2 for transfers, max assist for UB ADLs and total assist +2 for LB ADLs.  Cognitively, she requires increased time for processing and problem solving, decreased short term memory noted.  With any mobilization OOB she is highly fearful of falling.  She will benefit from continued OT services while admitted and after dc at SNF level to optimize independence, safety with ADLs and mobility to decrease burden of care.     Follow Up Recommendations  SNF;Supervision/Assistance - 24 hour    Equipment Recommendations  Other (comment) (TBD at next venue of care)    Recommendations for Other Services       Precautions / Restrictions Precautions Precautions: Fall Precaution Comments: C2 fx Required Braces or Orthoses: Cervical Brace Cervical Brace: Hard collar;At all times Restrictions Weight Bearing Restrictions: No      Mobility Bed Mobility Overal bed mobility: Needs Assistance Bed Mobility: Rolling;Sidelying to Sit;Sit to Supine Rolling: Max assist Sidelying to sit: Max assist;+2 for physical assistance   Sit to supine: Max assist;+2 for physical assistance   General bed mobility comments: pt able to bridge knees partially  when cued, assist needed to complete task. Max A to LE's and trunk to come from SL to sitting. Pt fearful of falling and requiring max A +2 to return to supine partly due to fear with position changes    Transfers Overall transfer level: Needs assistance Equipment used: 2 person hand held assist Transfers: Sit to/from Stand Sit to Stand: Mod assist;+2 physical assistance         General transfer comment: mod A +2 for power up and UE support in standing. Pt stood 2x, first time maintained posterior lean against  bed. Second time was able to take full wt on LE's    Balance Overall balance assessment: Needs assistance;History of Falls Sitting-balance support: Bilateral upper extremity supported;Feet supported Sitting balance-Leahy Scale: Poor Sitting balance - Comments: posterior lean in sitting Postural control: Posterior lean Standing balance support: Bilateral upper extremity supported;During functional activity Standing balance-Leahy Scale: Poor Standing balance comment: reliant on UE and external support                           ADL either performed or assessed with clinical judgement   ADL Overall ADL's : Needs assistance/impaired     Grooming: Minimal assistance;Sitting   Upper Body Bathing: Sitting;Maximal assistance   Lower Body Bathing: Maximal assistance;+2 for safety/equipment;+2 for physical assistance;Sit to/from stand   Upper Body Dressing : Minimal assistance;Sitting   Lower Body Dressing: Total assistance;+2 for physical assistance;+2 for safety/equipment;Sit to/from stand   Toilet Transfer: Moderate assistance;+2 for physical assistance;+2 for safety/equipment Toilet Transfer Details (indicate cue type and reason): simulated to  HOB         Functional mobility during ADLs: Moderate assistance;+2 for physical assistance;+2 for safety/equipment General ADL Comments: pt limited by neck pain, precautions, weakness     Vision          Perception     Praxis      Pertinent Vitals/Pain Pain Assessment: Faces Faces Pain Scale: Hurts little more Pain Location: neck Pain Descriptors / Indicators: Constant;Sore;Grimacing Pain Intervention(s): Limited activity within patient's tolerance;Monitored during session;Repositioned     Hand Dominance Right   Extremity/Trunk Assessment Upper Extremity Assessment Upper Extremity Assessment: Generalized weakness (to 90* FF)   Lower Extremity Assessment Lower Extremity Assessment: Defer to PT evaluation LLE Deficits / Details: bruise noted L knee   Cervical / Trunk Assessment Cervical / Trunk Assessment: Kyphotic   Communication Communication Communication: No difficulties   Cognition Arousal/Alertness: Awake/alert Behavior During Therapy: WFL for tasks assessed/performed Overall Cognitive Status: No family/caregiver present to determine baseline cognitive functioning Area of Impairment: Memory;Following commands;Problem solving;Orientation                 Orientation Level: Disoriented to;Situation   Memory: Decreased short-term memory;Decreased recall of precautions Following Commands: Follows one step commands consistently;Follows one step commands with increased time     Problem Solving: Slow processing;Difficulty sequencing;Requires verbal cues;Decreased initiation General Comments: pt with some short term memory loss, increased time for processing and problem solving and sequencing   General Comments  family arrived at end of session, agreeable to rehab    Exercises     Shoulder Instructions      Home Living Family/patient expects to be discharged to:: Private residence Living Arrangements: Children Available Help at Discharge: Family;Available 24 hours/day Type of Home: House Home Access: Stairs to enter [employer] of Steps: 2-4 and then another 1   Home Layout: One level     Bathroom Shower/Tub: Radiographer, therapeutic: Standard     Home Equipment: Environmental consultant - 2 wheels;Bedside commode;Shower seat   Additional Comments: daughter lives with her and garnddaughter currently staying there too right now      Prior Functioning/Environment Level of Independence: Needs assistance  Gait / Transfers Assistance Needed: ambulates with RW per chart but pt reports she did not have it when she fell ADL's / Homemaking Assistance Needed: has needed assist with bathing and dressing since she fell, prior to that daughter was laying clothes out for her            OT Problem List: Decreased strength;Decreased activity tolerance;Impaired balance (sitting and/or standing);Decreased cognition;Decreased safety awareness;Decreased knowledge of use of DME or AE;Decreased knowledge of precautions;Pain      OT Treatment/Interventions: Self-care/ADL training;Therapeutic exercise;DME and/or AE instruction;Therapeutic activities;Cognitive remediation/compensation;Patient/family education;Balance training    OT Goals(Current goals can be found in the care plan section) Acute Rehab OT Goals Patient Stated Goal: get better OT Goal Formulation: With patient Time For Goal Achievement: 02/16/21 Potential to Achieve Goals: Good  OT Frequency: Min 2X/week   Barriers to D/C:            Co-evaluation PT/OT/SLP Co-Evaluation/Treatment: Yes Reason for Co-Treatment: Complexity of the patient's impairments (multi-system involvement);Necessary to address cognition/behavior during functional activity;To address functional/ADL transfers PT goals addressed during session: Mobility/safety with mobility;Balance OT goals addressed during session: ADL's and self-care      AM-PAC OT "6 Clicks" Daily Activity     Outcome Measure Help from another person eating meals?: A Little Help from another person taking care of  personal grooming?: A Little Help from another person toileting, which includes using toliet, bedpan, or urinal?:  Total Help from another person bathing (including washing, rinsing, drying)?: A Lot Help from another person to put on and taking off regular upper body clothing?: A Lot Help from another person to put on and taking off regular lower body clothing?: Total 6 Click Score: 12   End of Session Equipment Utilized During Treatment: Cervical collar Nurse Communication: Mobility status  Activity Tolerance: Patient tolerated treatment well Patient left: in bed;with call bell/phone within reach;with bed alarm set;with family/visitor present  OT Visit Diagnosis: Other abnormalities of gait and mobility (R26.89);Muscle weakness (generalized) (M62.81);Pain;Other symptoms and signs involving cognitive function;History of falling (Z91.81) Pain - part of body:  (neck)                Time: 1338-1410 OT Time Calculation (min): 32 min Charges:  OT General Charges $OT Visit: 1 Visit OT Evaluation $OT Eval Moderate Complexity: 1 Mod  Jolaine Artist, OT Acute Rehabilitation Services Pager [phone] Office [phone]   Delight Stare 02/02/2021, 2:58 PM

## 2021-02-02 NOTE — Consult Note (Addendum)
Reason for Consult: Type III odontoid fracture  referring Physician: Dr. Aileen Fass  Alexandria Walton is an 87 y.o. female.  HPI: Patient is an 87 year old individual with right-sided weakness and recent fall.  She has a type III odontoid fracture which is nondisplaced.  She also has a large aortic aneurysm on a CT of her chest.  Plan for treatment of the type III odontoid fracture is collar immobilization as all the fragments lined up well.  Past Medical History:  Diagnosis Date  . Abdominal aneurysm without mention of rupture   . Abdominal pain, unspecified site   . Acute bronchitis   . Allergic rhinitis due to pollen   . CAD (coronary artery disease)   . Chronic kidney disease, stage II (mild)   . Essential hypertension, malignant   . GERD (gastroesophageal reflux disease)   . Headache(784.0)   . High cholesterol   . Memory loss 01/30/2013  . OSA (obstructive sleep apnea)    mod OSA '07  . Osteoarthrosis, unspecified whether generalized or localized, unspecified site   . PE (pulmonary embolism)    2005  . Pure hypercholesterolemia     Past Surgical History:  Procedure Laterality Date  . CATARACT EXTRACTION W/PHACO  03/14/2012   Procedure: CATARACT EXTRACTION PHACO AND INTRAOCULAR LENS PLACEMENT (IOC);  Surgeon: Marylynn Pearson, MD;  Location: [facility];  Service: Ophthalmology;  Laterality: Left;  . EYE SURGERY  2010   cat ext right  . I & D EXTREMITY Left 07/29/2016   Procedure: Left Partial Calcaneus Excision;  Surgeon: Newt Minion, MD;  Location: [facility];  Service: Orthopedics;  Laterality: Left;  Marland Kitchen VASCULAR SURGERY  95? 97?   AAA    Family History  Problem Relation Age of Onset  . Anesthesia problems Neg Hx     Social History:  reports that she quit smoking about 27 years ago. She has never used smokeless tobacco. She reports that she does not drink alcohol and does not use drugs.  Allergies:  Allergies  Allergen Reactions  . Codeine Nausea Only  . Morphine And Related  Nausea Only  . Other Nausea Only    Medications: I have reviewed the patient's current medications.  Results for orders placed or performed during the hospital encounter of 02/01/21 (from the past 48 hour(s))  Resp Panel by RT-PCR (Flu A&B, Covid) Nasopharyngeal Swab     Status: None   Collection Time: 02/01/21 12:43 PM   Specimen: Nasopharyngeal Swab; Nasopharyngeal(NP) swabs in vial transport medium  Result Value Ref Range   SARS Coronavirus 2 by RT PCR NEGATIVE NEGATIVE    Comment: (NOTE) SARS-CoV-2 target nucleic acids are NOT DETECTED.  The SARS-CoV-2 RNA is generally detectable in upper respiratory specimens during the acute phase of infection. The lowest concentration of SARS-CoV-2 viral copies this assay can detect is 138 copies/mL. A negative result does not preclude SARS-Cov-2 infection and should not be used as the sole basis for treatment or other patient management decisions. A negative result may occur with  improper specimen collection/handling, submission of specimen other than nasopharyngeal swab, presence of viral mutation(s) within the areas targeted by this assay, and inadequate number of viral copies(<138 copies/mL). A negative result must be combined with clinical observations, patient history, and epidemiological information. The expected result is Negative.  Fact Sheet for Patients:  EntrepreneurPulse.com.au  Fact Sheet for Healthcare Providers:  IncredibleEmployment.be  This test is no t yet approved or cleared by the [place] FDA and  has  been authorized for detection and/or diagnosis of SARS-CoV-2 by FDA under an Emergency Use Authorization (EUA). This EUA will remain  in effect (meaning this test can be used) for the duration of the COVID-19 declaration under Section 564(b)(1) of the Act, 21 U.S.C.section 360bbb-3(b)(1), unless the authorization is terminated  or revoked sooner.       Influenza A by PCR  NEGATIVE NEGATIVE   Influenza B by PCR NEGATIVE NEGATIVE    Comment: (NOTE) The Xpert Xpress SARS-CoV-2/FLU/RSV plus assay is intended as an aid in the diagnosis of influenza from Nasopharyngeal swab specimens and should not be used as a sole basis for treatment. Nasal washings and aspirates are unacceptable for Xpert Xpress SARS-CoV-2/FLU/RSV testing.  Fact Sheet for Patients: EntrepreneurPulse.com.au  Fact Sheet for Healthcare Providers: IncredibleEmployment.be  This test is not yet approved or cleared by the [place] FDA and has been authorized for detection and/or diagnosis of SARS-CoV-2 by FDA under an Emergency Use Authorization (EUA). This EUA will remain in effect (meaning this test can be used) for the duration of the COVID-19 declaration under Section 564(b)(1) of the Act, 21 U.S.C. section 360bbb-3(b)(1), unless the authorization is terminated or revoked.  Performed at [facility], [place] [address], [place], [place] [address]   Ethanol     Status: None   Collection Time: 02/01/21 12:43 PM  Result Value Ref Range   Alcohol, Ethyl (B) <10 <10 mg/dL    Comment: (NOTE) Lowest detectable limit for serum alcohol is 10 mg/dL.  For medical purposes only. Performed at [facility], [place] [address], [place], [place] [address]   Protime-INR     Status: None   Collection Time: 02/01/21 12:43 PM  Result Value Ref Range   Prothrombin Time 15.1 11.4 - 15.2 seconds   INR 1.2 0.8 - 1.2    Comment: (NOTE) INR goal varies based on device and disease states. Performed at [facility], [place] [address], [place], [place] [address]   APTT     Status: None   Collection Time: 02/01/21 12:43 PM  Result Value Ref Range   aPTT 30 24 - 36 seconds    Comment: Performed at [facility] [address], [place], [place] [address]  CBC     Status: Abnormal   Collection Time: 02/01/21 12:43 PM  Result Value Ref Range   WBC  9.3 4.0 - 10.5 K/uL   RBC 3.61 (L) 3.87 - 5.11 MIL/uL   Hemoglobin 10.9 (L) 12.0 - 15.0 g/dL   HCT 34.1 (L) 36.0 - 46.0 %   MCV 94.5 80.0 - 100.0 fL   MCH 30.2 26.0 - 34.0 pg   MCHC 32.0 30.0 - 36.0 g/dL   RDW 13.9 11.5 - 15.5 %   Platelets 204 150 - 400 K/uL   nRBC 0.0 0.0 - 0.2 %    Comment: Performed at [facility], [place] [address] [place], [place] [address]  Differential     Status: None   Collection Time: 02/01/21 12:43 PM  Result Value Ref Range   Neutrophils Relative % 67 %   Neutro Abs 6.2 1.7 - 7.7 K/uL   Lymphocytes Relative 22 %   Lymphs Abs 2.0 0.7 - 4.0 K/uL   Monocytes Relative 8 %   Monocytes Absolute 0.7 0.1 - 1.0 K/uL   Eosinophils Relative 3 %   Eosinophils Absolute 0.3 0.0 - 0.5 K/uL   Basophils Relative 0 %   Basophils Absolute  0.0 0.0 - 0.1 K/uL   Immature Granulocytes 0 %   Abs Immature Granulocytes 0.04 0.00 - 0.07 K/uL    Comment: Performed at [facility], [place] [address], [place], [place] [address]  Comprehensive metabolic panel     Status: Abnormal   Collection Time: 02/01/21 12:43 PM  Result Value Ref Range   Sodium 141 135 - 145 mmol/L   Potassium 4.6 3.5 - 5.1 mmol/L   Chloride 108 98 - 111 mmol/L   CO2 26 22 - 32 mmol/L   Glucose, Bld 94 70 - 99 mg/dL    Comment: Glucose reference range applies only to samples taken after fasting for at least 8 hours.   BUN 47 (H) 8 - 23 mg/dL   Creatinine, Ser 2.29 (H) 0.44 - 1.00 mg/dL   Calcium 9.6 8.9 - 10.3 mg/dL   Total Protein 7.2 6.5 - 8.1 g/dL   Albumin 3.4 (L) 3.5 - 5.0 g/dL   AST 40 15 - 41 U/L   ALT 41 0 - 44 U/L   Alkaline Phosphatase 90 38 - 126 U/L   Total Bilirubin 0.9 0.3 - 1.2 mg/dL   GFR, Estimated 20 (L) >60 mL/min    Comment: (NOTE) Calculated using the CKD-EPI Creatinine Equation (2021)    Anion gap 7 5 - 15    Comment: Performed at [facility] [address] [place], [place] [address]  I-stat chem 8, ED     Status: Abnormal   Collection Time: 02/01/21 12:52  PM  Result Value Ref Range   Sodium 143 135 - 145 mmol/L   Potassium 4.6 3.5 - 5.1 mmol/L   Chloride 108 98 - 111 mmol/L   BUN 50 (H) 8 - 23 mg/dL   Creatinine, Ser 2.30 (H) 0.44 - 1.00 mg/dL   Glucose, Bld 90 70 - 99 mg/dL    Comment: Glucose reference range applies only to samples taken after fasting for at least 8 hours.   Calcium, Ion 1.18 1.15 - 1.40 mmol/L   TCO2 27 22 - 32 mmol/L   Hemoglobin 11.6 (L) 12.0 - 15.0 g/dL   HCT 34.0 (L) 36.0 - 46.0 %  Ammonia     Status: None   Collection Time: 02/01/21  1:44 PM  Result Value Ref Range   Ammonia 29 9 - 35 umol/L    Comment: Performed at [facility], [place] [address], [place], [place] [address]  Hemoglobin A1c     Status: None   Collection Time: 02/02/21  2:40 AM  Result Value Ref Range   Hgb A1c MFr Bld 5.3 4.8 - 5.6 %    Comment: (NOTE) Pre diabetes:          5.7%-6.4%  Diabetes:              >6.4%  Glycemic control for   <7.0% adults with diabetes    Mean Plasma Glucose 105.41 mg/dL    Comment: Performed at [facility] [address] [place], [place] [address]  Lipid panel     Status: Abnormal   Collection Time: 02/02/21  2:40 AM  Result Value Ref Range   Cholesterol 223 (H) 0 - 200 mg/dL   Triglycerides 137 <150 mg/dL   HDL 52 >40 mg/dL   Total CHOL/HDL Ratio 4.3 RATIO   VLDL 27 0 - 40 mg/dL   LDL Cholesterol 144 (H) 0 - 99 mg/dL    Comment:  Total Cholesterol/HDL:CHD Risk Coronary Heart Disease Risk Table                     Men   Women  1/2 Average Risk   3.4   3.3  Average Risk       5.0   4.4  2 X Average Risk   9.6   7.1  3 X Average Risk  23.4   11.0        Use the calculated Patient Ratio above and the CHD Risk Table to determine the patient's CHD Risk.        ATP III CLASSIFICATION (LDL):  <100     mg/dL   Optimal  100-129  mg/dL   Near or Above                    Optimal  130-159  mg/dL   Borderline  160-189  mg/dL   High  >190     mg/dL   Very High Performed at [facility] [address], [place], [place] [address]   CBC with Differential/Platelet     Status: Abnormal   Collection Time: 02/02/21  2:40 AM  Result Value Ref Range   WBC 9.1 4.0 - 10.5 K/uL   RBC 3.57 (L) 3.87 - 5.11 MIL/uL   Hemoglobin 10.5 (L) 12.0 - 15.0 g/dL   HCT 33.8 (L) 36.0 - 46.0 %   MCV 94.7 80.0 - 100.0 fL   MCH 29.4 26.0 - 34.0 pg   MCHC 31.1 30.0 - 36.0 g/dL   RDW 13.6 11.5 - 15.5 %   Platelets 194 150 - 400 K/uL   nRBC 0.0 0.0 - 0.2 %   Neutrophils Relative % 64 %   Neutro Abs 5.8 1.7 - 7.7 K/uL   Lymphocytes Relative 23 %   Lymphs Abs 2.1 0.7 - 4.0 K/uL   Monocytes Relative 9 %   Monocytes Absolute 0.9 0.1 - 1.0 K/uL   Eosinophils Relative 3 %   Eosinophils Absolute 0.3 0.0 - 0.5 K/uL   Basophils Relative 1 %   Basophils Absolute 0.1 0.0 - 0.1 K/uL   Immature Granulocytes 0 %   Abs Immature Granulocytes 0.03 0.00 - 0.07 K/uL    Comment: Performed at [facility] [address] [place], [place] [address]  Comprehensive metabolic panel     Status: Abnormal   Collection Time: 02/02/21  2:40 AM  Result Value Ref Range   Sodium 140 135 - 145 mmol/L   Potassium 4.5 3.5 - 5.1 mmol/L   Chloride 108 98 - 111 mmol/L   CO2 23 22 - 32 mmol/L   Glucose, Bld 82 70 - 99 mg/dL    Comment: Glucose reference range applies only to samples taken after fasting for at least 8 hours.   BUN 41 (H) 8 - 23 mg/dL   Creatinine, Ser 1.78 (H) 0.44 - 1.00 mg/dL   Calcium 9.3 8.9 - 10.3 mg/dL   Total Protein 7.0 6.5 - 8.1 g/dL   Albumin 3.3 (L) 3.5 - 5.0 g/dL   AST 30 15 - 41 U/L   ALT 32 0 - 44 U/L   Alkaline Phosphatase 85 38 - 126 U/L   Total Bilirubin 1.1 0.3 - 1.2 mg/dL   GFR, Estimated 27 (L) >60 mL/min    Comment: (NOTE) Calculated using the CKD-EPI Creatinine Equation (2021)    Anion gap 9 5 - 15    Comment:  Performed at [facility], [place] [address] [place],  [address]    CT CHEST WO CONTRAST  Result Date: 02/01/2021 CLINICAL DATA:  Chest  bruising after fall. Thoracic aortic aneurysm. EXAM: CT CHEST WITHOUT CONTRAST TECHNIQUE: Multidetector CT imaging of the chest was performed following the standard protocol without IV contrast. COMPARISON:  February 22, 2016. FINDINGS: Cardiovascular: Atherosclerosis and tortuosity of thoracic aorta is noted. There is aneurysmal dilatation of the distal transverse aortic arch and proximal descending thoracic aorta with maximum measured diameter of 6.5 cm which is significantly enlarged compared to prior exam of 2017. This most likely represents enlarging dissection. 5.6 cm aneurysmal dilatation of the distal portion of the ascending thoracic aorta is noted which is enlarged compared to prior exam, also consistent with enlarging dissection. 4.8 cm proximal ascending thoracic aortic aneurysm is noted. Mild cardiomegaly is noted. No pericardial effusion is noted. Coronary artery calcifications are noted. Mediastinum/Nodes: No enlarged mediastinal or axillary lymph nodes. Thyroid gland, trachea, and esophagus demonstrate no significant findings. Lungs/Pleura: No pneumothorax or pleural effusion is noted. Minimal bibasilar subsegmental atelectasis is noted. Upper Abdomen: No acute abnormality. Musculoskeletal: No chest wall mass or suspicious bone lesions identified. IMPRESSION: 1. There is aneurysmal dilatation of the distal transverse aortic arch and proximal descending thoracic aorta with maximum measured diameter of 6.5 cm which is significantly enlarged compared to prior exam of 2017. This most likely represents enlarging dissection. CT angiography of the thoracic aorta is recommended for further evaluation. 2. 5.6 cm aneurysmal dilatation of the distal portion of the ascending thoracic aorta is noted which is enlarged compared to prior exam, also consistent with enlarging dissection. CT angiography of thoracic aorta is recommended for further evaluation. 3. 4.8 cm proximal ascending thoracic aortic aneurysm is noted.  4. Coronary artery calcifications are noted suggesting coronary artery disease. 5. Aortic atherosclerosis. Aortic Atherosclerosis (ICD10-I70.0). Electronically Signed   By: Marijo Conception M.D.   On: 02/01/2021 19:33   CT CERVICAL SPINE WO CONTRAST  Result Date: 02/01/2021 CLINICAL DATA:  fall few weeks ago. entire face and chest is bruised. Neck pain Eval AA EXAM: CT CERVICAL SPINE WITHOUT CONTRAST TECHNIQUE: Multidetector CT imaging of the cervical spine was performed without intravenous contrast. Multiplanar CT image reconstructions were also generated. COMPARISON:  CT cervical spine 10/25/2012 FINDINGS: Alignment: Normal. Skull base and vertebrae: Acute nondisplaced C2 fracture of the body of the axis (9:15-16, 8:24). Multilevel degenerative changes of the spine. No aggressive appearing focal osseous lesion or focal pathologic process. Soft tissues and spinal canal: No prevertebral fluid or swelling. No visible canal hematoma. Upper chest: Biapical pleural/pulmonary scarring. Other: Aneurysmal aorta with suggestion of dissection. Carotid artery atherosclerotic plaque within the neck. IMPRESSION: 1. Acute type III C2 fracture.  Please place C-collar. 2. Please see separately dictated CT chest 02/01/2021 regarding aneurysm/dissection of the aorta. These results were called by telephone at the time of interpretation on 02/01/2021 at 7:36 pm to provider PA Margarita Mail, who verbally acknowledged these results. Electronically Signed   By: Iven Finn M.D.   On: 02/01/2021 19:44   MR ANGIO HEAD WO CONTRAST  Result Date: 02/02/2021 CLINICAL DATA:  Transient ischemic attack EXAM: MRI HEAD WITHOUT CONTRAST MRA HEAD WITHOUT CONTRAST TECHNIQUE: Multiplanar, multiecho pulse sequences of the brain and surrounding structures were obtained without intravenous contrast. Angiographic images of the head were obtained using MRA technique without contrast. COMPARISON:  None. FINDINGS: MRI HEAD FINDINGS Brain: No acute  infarct, mass effect or extra-axial collection. Multifocal  chronic microhemorrhage. Confluent hyperintense T2-weighted white matter signal. Diffuse, severe atrophy. 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 The midline structures are normal. Vascular: Major flow voids are preserved. Skull and upper cervical spine: Normal calvarium and skull base. Visualized upper cervical spine and soft tissues are normal. Sinuses/Orbits:No paranasal sinus fluid levels or advanced mucosal thickening. No mastoid or middle ear effusion. Normal orbits. MRA HEAD FINDINGS POSTERIOR CIRCULATION: --Vertebral arteries: Normal --Inferior cerebellar arteries: Normal. --Basilar artery: Normal. --Superior cerebellar arteries: Normal. --Posterior cerebral arteries: Normal. ANTERIOR CIRCULATION: --Intracranial internal carotid arteries: Normal. --Anterior cerebral arteries (ACA): Normal. --Middle cerebral arteries (MCA): Normal. ANATOMIC VARIANTS: Fetal origin of the left PCA. IMPRESSION: 1. No acute intracranial abnormality. 2. Severe atrophy and chronic small vessel disease. 3. Normal intracranial MRA. Electronically Signed   By: Ulyses Jarred M.D.   On: 02/02/2021 02:45   MR BRAIN WO CONTRAST  Result Date: 02/02/2021 CLINICAL DATA:  Transient ischemic attack EXAM: MRI HEAD WITHOUT CONTRAST MRA HEAD WITHOUT CONTRAST TECHNIQUE: Multiplanar, multiecho pulse sequences of the brain and surrounding structures were obtained without intravenous contrast. Angiographic images of the head were obtained using MRA technique without contrast. COMPARISON:  None. FINDINGS: MRI HEAD FINDINGS Brain: No acute infarct, mass effect or extra-axial collection. Multifocal chronic microhemorrhage. Confluent hyperintense T2-weighted white matter signal. Diffuse, severe atrophy. 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 The midline structures are normal. Vascular: Major flow voids are preserved. Skull and upper  cervical spine: Normal calvarium and skull base. Visualized upper cervical spine and soft tissues are normal. Sinuses/Orbits:No paranasal sinus fluid levels or advanced mucosal thickening. No mastoid or middle ear effusion. Normal orbits. MRA HEAD FINDINGS POSTERIOR CIRCULATION: --Vertebral arteries: Normal --Inferior cerebellar arteries: Normal. --Basilar artery: Normal. --Superior cerebellar arteries: Normal. --Posterior cerebral arteries: Normal. ANTERIOR CIRCULATION: --Intracranial internal carotid arteries: Normal. --Anterior cerebral arteries (ACA): Normal. --Middle cerebral arteries (MCA): Normal. ANATOMIC VARIANTS: Fetal origin of the left PCA. IMPRESSION: 1. No acute intracranial abnormality. 2. Severe atrophy and chronic small vessel disease. 3. Normal intracranial MRA. Electronically Signed   By: Ulyses Jarred M.D.   On: 02/02/2021 02:45   DG CHEST PORT 1 VIEW  Result Date: 02/01/2021 CLINICAL DATA:  Chest pain EXAM: PORTABLE CHEST 1 VIEW COMPARISON:  04/15/2020, 11/28/2018 FINDINGS: No focal opacity or pleural effusion. There is mild cardiomegaly. Suspected increased size of aneurysmal dilatation and known dissection at the aortic arch with transverse arch diameter of approximate 6.8 cm, compared with 5.8 cm on prior radiograph. IMPRESSION: No acute pulmonary infiltrate. Mild cardiomegaly. Suspected increased size of aneurysmal dilatation at the aortic arch; patient has known history of type a dissection. Given significant increase in size/change in appearance, suggest follow-up CT angiography. Critical Value/emergent results were called by telephone at the time of interpretation on 02/01/2021 at 6:06 pm to provider [facility] PATEL , who verbally acknowledged these results. Electronically Signed   By: Donavan Foil M.D.   On: 02/01/2021 18:06   DG Abd Portable 1V  Result Date: 02/01/2021 CLINICAL DATA:  Constipated EXAM: PORTABLE ABDOMEN - 1 VIEW COMPARISON:  CT 02/22/2016 FINDINGS: Diffusely  calcified aorta. Nonobstructed gas pattern with moderate stool in the colon. Phleboliths in the pelvis. Advanced degenerative changes of both hips. IMPRESSION: Nonobstructed gas pattern with moderate stool in the colon. Electronically Signed   By: Donavan Foil M.D.   On: 02/01/2021 17:48   CT HEAD CODE STROKE WO CONTRAST  Result Date: 02/01/2021 CLINICAL DATA:  Code stroke.  Altered mental  status EXAM: CT HEAD WITHOUT CONTRAST TECHNIQUE: Contiguous axial images were obtained from the base of the skull through the vertex without intravenous contrast. COMPARISON:  01/23/2021 FINDINGS: Brain: There is no acute intracranial hemorrhage or mass effect. No new loss of gray-white differentiation. Chronic infarcts of the left basal ganglia and adjacent white matter and right insula. Additional patchy and confluent areas of hypoattenuation in the supratentorial white matter likely reflects stable advanced chronic microvascular ischemic changes. Prominence of the ventricles and sulci reflects stable parenchymal volume loss. No extra-axial collection. Vascular: No hyperdense vessel. There is intracranial atherosclerotic calcification at the skull base. Skull: Unremarkable. Sinuses/Orbits: No acute abnormality. Other: Mastoid air cells are clear. ASPECTS ([place] Stroke Program Early CT Score) - Ganglionic level infarction (caudate, lentiform nuclei, internal capsule, insula, M1-M3 cortex): 7 - Supraganglionic infarction (M4-M6 cortex): 3 Total score (0-10 with 10 being normal): 10 IMPRESSION: There is no acute intracranial hemorrhage or evidence of acute infarction. ASPECT score is 10. Stable chronic findings detailed above. These results were communicated to Dr. Lorrin Goodell at 1:06 pm on 02/01/2021 by text page via the [facility] messaging system. Electronically Signed   By: Macy Mis M.D.   On: 02/01/2021 13:09    Review of Systems  Constitutional: Positive for activity change.  HENT: Negative.   Eyes: Negative.    Respiratory: Negative.   Gastrointestinal: Negative.   Endocrine: Negative.   Genitourinary: Negative.   Allergic/Immunologic: Negative.   Neurological: Negative.   Hematological: Negative.   Psychiatric/Behavioral: Negative.    Blood pressure (!) 153/81, pulse 77, temperature 97.7 F (36.5 C), temperature source Oral, resp. rate 18, height 5\' 7"  (1.702 m), weight 55 kg, SpO2 97 %. Physical Exam Constitutional:      Appearance: Normal appearance.  HENT:     Head:     Comments: Significant facial ecchymoses worse on the right than on the left.  Conjunctiva are clear.    Nose: Nose normal.     Mouth/Throat:     Mouth: Mucous membranes are moist.  Eyes:     Extraocular Movements: Extraocular movements intact.     Pupils: Pupils are equal, round, and reactive to light.  Neck:     Comments: In hard cervical collar Cardiovascular:     Rate and Rhythm: Normal rate.     Pulses: Normal pulses.     Heart sounds: Normal heart sounds.  Pulmonary:     Effort: Pulmonary effort is normal.     Breath sounds: Normal breath sounds.  Neurological:     Mental Status: She is alert.     Comments: Alert and talking normally.  No facial asymmetry appreciated at this time.  Upper extremity strength appears intact with 4 out of 5 strength in all muscle groups tested lower extremity strength similarly 4 out of 5 in all muscle groups tested.  Psychiatric:        Mood and Affect: Mood normal.        Behavior: Behavior normal.        Thought Content: Thought content normal.        Judgment: Judgment normal.     Assessment/Plan: Type III odontoid fracture nondisplaced.  Plan: Hard collar immobilization.  Blanchie Dessert Ashla Murph 02/02/2021, 10:09 AM

## 2021-02-02 NOTE — Progress Notes (Addendum)
[facility] PROGRESS NOTE    Progress Note  Alexandria Walton  BJY:[id number] DOB: 1929-10-20 DOA: 02/01/2021 PCP: Rogers Blocker, MD     Brief Narrative:   Alexandria Walton is an 87 y.o. female past medical history of chronic kidney disease stage III tension AAA comes in with right-sided weakness that started on the day of admission.  She relate when she woke up she was having slurred speech, the daughter relates some facial droop and right-sided weakness and brought her to the ED. Significant studies: 02/01/2021 CT of the head showed no acute findings. 02/01/2021 abdominal x-ray moderate stool in colon. 02/01/2021 chest x-ray no acute findings.   02/01/2021 CT of the neck showed an acute type III C2 fracture 2022 CT of the chest showed aneurysmal dilation of the transverse aortic arc with maximal measurement of 6.5 cm which is enlarged compared to prior study in 2017 5.6 cm aneurysmal dilation of the distal portion of the ascending aorta enlarged compared to prior exam.  And a 4.8 cm proximal ascending aortic aneurysmal dilation noted. 02/02/2021 MRI of the brain showed no acute findings severe atrophy. 02/02/2021 MRA of the brain unremarkable intracranial arteries Antibiotics: None  Microbiology data: Blood culture:  Procedures: None  Assessment/Plan:   TIA (transient ischemic attack): HgbA1c 5.3, fasting lipid panel HDL 52, LDL 144 MRI, MRA of the brain without contrast showed no acute findings PT, OT, pending . Transthoracic Echo pending Start patient on ASA 81mg  daily. Continue high-dose statin  Telemetry monitoring EEG showed right frontotemporal region suggestive of focal cerebral dysfunction, there were no elective form discharges present. Appreciate neurology's assistance. Family would like the patient placed in a skilled nursing facility if necessary.  Syncope/unwitnessed fall/nasal fracture CT of the head was unremarkable patient on Augmentin and was recommended to  follow-up with ENT which will be done as an outpatient. Patient had no prodromal symptoms and not confused did not lose consciousness. Likely mechanical fall. Continue Augmentin.  Left leg base pain due to C2 fracture: The case was discussed with neurosurgeon who recommended to place a hard cervical collar. Formal evaluation later on today.  Acute kidney injury on chronic kidney disease stage IIIb 21.4-1.7 on admission on admission 2.3. She was added on IV fluids and her creatinine has returned to baseline. We will allow 12 more hours IV fluid hydration and resume ACE inhibitor. Essential hypertension  History of thoracic aortic arch dissection: Noted not a surgical candidate the does not want surgical intervention.  Moderate constipation: Start MiraLAX p.o. twice daily for 2 days.  RN Pressure Injury Documentation: Pressure Injury 07/25/16 odorous, draining 2X2 wound on left heel. (Active)  07/25/16   Location: Heel  Location Orientation: Left  Staging:   Wound Description (Comments): odorous, draining 2X2 wound on left heel.  Present on Admission: Yes     DVT prophylaxis: heparin Family Communication:daughter Status is: Observation  The patient remains OBS appropriate and will d/c before 2 midnights.  Dispo: The patient is from: Home              Anticipated d/c is to: Home              Patient currently is not medically stable to d/c.   Difficult to place patient No        Code Status:     Code Status Orders  (From admission, onward)         Start     Ordered   02/01/21 1646  Do not attempt resuscitation (DNR)  Continuous       Question Answer Comment  In the event of cardiac or respiratory ARREST Do not call a "code blue"   In the event of cardiac or respiratory ARREST Do not perform Intubation, CPR, defibrillation or ACLS   In the event of cardiac or respiratory ARREST Use medication by any route, position, wound care, and other measures to relive  pain and suffering. May use oxygen, suction and manual treatment of airway obstruction as needed for comfort.      02/01/21 1647        Code Status History    Date Active Date Inactive Code Status Order ID Comments User Context   02/01/2021 1554 02/01/2021 1647 DNR [id number]  Lavina Hamman, MD ED   07/25/2016 1809 08/01/2016 1732 DNR [id number]  Murlean Iba, MD Inpatient   02/22/2016 1419 02/26/2016 2243 DNR [id number]  Corey Harold, NP ED   02/22/2016 1218 02/22/2016 1419 Full Code [id number]  Willia Craze, NP ED   Advance Care Planning Activity    Advance Directive Documentation   Flowsheet Row Most Recent Value  Type of Advance Directive Healthcare Power of [place], Out of facility DNR (pink MOST or yellow form)  Pre-existing out of facility DNR order (yellow form or pink MOST form) --  "MOST" Form in Place? --        IV Access:    Peripheral IV   Procedures and diagnostic studies:   CT CHEST WO CONTRAST  Result Date: 02/01/2021 CLINICAL DATA:  Chest bruising after fall. Thoracic aortic aneurysm. EXAM: CT CHEST WITHOUT CONTRAST TECHNIQUE: Multidetector CT imaging of the chest was performed following the standard protocol without IV contrast. COMPARISON:  February 22, 2016. FINDINGS: Cardiovascular: Atherosclerosis and tortuosity of thoracic aorta is noted. There is aneurysmal dilatation of the distal transverse aortic arch and proximal descending thoracic aorta with maximum measured diameter of 6.5 cm which is significantly enlarged compared to prior exam of 2017. This most likely represents enlarging dissection. 5.6 cm aneurysmal dilatation of the distal portion of the ascending thoracic aorta is noted which is enlarged compared to prior exam, also consistent with enlarging dissection. 4.8 cm proximal ascending thoracic aortic aneurysm is noted. Mild cardiomegaly is noted. No pericardial effusion is noted. Coronary artery calcifications are noted. Mediastinum/Nodes: No  enlarged mediastinal or axillary lymph nodes. Thyroid gland, trachea, and esophagus demonstrate no significant findings. Lungs/Pleura: No pneumothorax or pleural effusion is noted. Minimal bibasilar subsegmental atelectasis is noted. Upper Abdomen: No acute abnormality. Musculoskeletal: No chest wall mass or suspicious bone lesions identified. IMPRESSION: 1. There is aneurysmal dilatation of the distal transverse aortic arch and proximal descending thoracic aorta with maximum measured diameter of 6.5 cm which is significantly enlarged compared to prior exam of 2017. This most likely represents enlarging dissection. CT angiography of the thoracic aorta is recommended for further evaluation. 2. 5.6 cm aneurysmal dilatation of the distal portion of the ascending thoracic aorta is noted which is enlarged compared to prior exam, also consistent with enlarging dissection. CT angiography of thoracic aorta is recommended for further evaluation. 3. 4.8 cm proximal ascending thoracic aortic aneurysm is noted. 4. Coronary artery calcifications are noted suggesting coronary artery disease. 5. Aortic atherosclerosis. Aortic Atherosclerosis (ICD10-I70.0). Electronically Signed   By: Marijo Conception M.D.   On: 02/01/2021 19:33   CT CERVICAL SPINE WO CONTRAST  Result Date: 02/01/2021 CLINICAL DATA:  fall few weeks ago. entire face and chest is bruised.  Neck pain Eval AA EXAM: CT CERVICAL SPINE WITHOUT CONTRAST TECHNIQUE: Multidetector CT imaging of the cervical spine was performed without intravenous contrast. Multiplanar CT image reconstructions were also generated. COMPARISON:  CT cervical spine 10/25/2012 FINDINGS: Alignment: Normal. Skull base and vertebrae: Acute nondisplaced C2 fracture of the body of the axis (9:15-16, 8:24). Multilevel degenerative changes of the spine. No aggressive appearing focal osseous lesion or focal pathologic process. Soft tissues and spinal canal: No prevertebral fluid or swelling. No visible  canal hematoma. Upper chest: Biapical pleural/pulmonary scarring. Other: Aneurysmal aorta with suggestion of dissection. Carotid artery atherosclerotic plaque within the neck. IMPRESSION: 1. Acute type III C2 fracture.  Please place C-collar. 2. Please see separately dictated CT chest 02/01/2021 regarding aneurysm/dissection of the aorta. These results were called by telephone at the time of interpretation on 02/01/2021 at 7:36 pm to provider PA Margarita Mail, who verbally acknowledged these results. Electronically Signed   By: Iven Finn M.D.   On: 02/01/2021 19:44   MR ANGIO HEAD WO CONTRAST  Result Date: 02/02/2021 CLINICAL DATA:  Transient ischemic attack EXAM: MRI HEAD WITHOUT CONTRAST MRA HEAD WITHOUT CONTRAST TECHNIQUE: Multiplanar, multiecho pulse sequences of the brain and surrounding structures were obtained without intravenous contrast. Angiographic images of the head were obtained using MRA technique without contrast. COMPARISON:  None. FINDINGS: MRI HEAD FINDINGS Brain: No acute infarct, mass effect or extra-axial collection. Multifocal chronic microhemorrhage. Confluent hyperintense T2-weighted white matter signal. Diffuse, severe atrophy. 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 The midline structures are normal. Vascular: Major flow voids are preserved. Skull and upper cervical spine: Normal calvarium and skull base. Visualized upper cervical spine and soft tissues are normal. Sinuses/Orbits:No paranasal sinus fluid levels or advanced mucosal thickening. No mastoid or middle ear effusion. Normal orbits. MRA HEAD FINDINGS POSTERIOR CIRCULATION: --Vertebral arteries: Normal --Inferior cerebellar arteries: Normal. --Basilar artery: Normal. --Superior cerebellar arteries: Normal. --Posterior cerebral arteries: Normal. ANTERIOR CIRCULATION: --Intracranial internal carotid arteries: Normal. --Anterior cerebral arteries (ACA): Normal. --Middle cerebral arteries (MCA):  Normal. ANATOMIC VARIANTS: Fetal origin of the left PCA. IMPRESSION: 1. No acute intracranial abnormality. 2. Severe atrophy and chronic small vessel disease. 3. Normal intracranial MRA. Electronically Signed   By: Ulyses Jarred M.D.   On: 02/02/2021 02:45   MR BRAIN WO CONTRAST  Result Date: 02/02/2021 CLINICAL DATA:  Transient ischemic attack EXAM: MRI HEAD WITHOUT CONTRAST MRA HEAD WITHOUT CONTRAST TECHNIQUE: Multiplanar, multiecho pulse sequences of the brain and surrounding structures were obtained without intravenous contrast. Angiographic images of the head were obtained using MRA technique without contrast. COMPARISON:  None. FINDINGS: MRI HEAD FINDINGS Brain: No acute infarct, mass effect or extra-axial collection. Multifocal chronic microhemorrhage. Confluent hyperintense T2-weighted white matter signal. Diffuse, severe atrophy. 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 The midline structures are normal. Vascular: Major flow voids are preserved. Skull and upper cervical spine: Normal calvarium and skull base. Visualized upper cervical spine and soft tissues are normal. Sinuses/Orbits:No paranasal sinus fluid levels or advanced mucosal thickening. No mastoid or middle ear effusion. Normal orbits. MRA HEAD FINDINGS POSTERIOR CIRCULATION: --Vertebral arteries: Normal --Inferior cerebellar arteries: Normal. --Basilar artery: Normal. --Superior cerebellar arteries: Normal. --Posterior cerebral arteries: Normal. ANTERIOR CIRCULATION: --Intracranial internal carotid arteries: Normal. --Anterior cerebral arteries (ACA): Normal. --Middle cerebral arteries (MCA): Normal. ANATOMIC VARIANTS: Fetal origin of the left PCA. IMPRESSION: 1. No acute intracranial abnormality. 2. Severe atrophy and chronic small vessel disease. 3. Normal intracranial MRA. Electronically Signed   By: Lennette Bihari  [place] M.D.   On: 02/02/2021 02:45   DG CHEST PORT 1 VIEW  Result Date: 02/01/2021 CLINICAL DATA:   Chest pain EXAM: PORTABLE CHEST 1 VIEW COMPARISON:  04/15/2020, 11/28/2018 FINDINGS: No focal opacity or pleural effusion. There is mild cardiomegaly. Suspected increased size of aneurysmal dilatation and known dissection at the aortic arch with transverse arch diameter of approximate 6.8 cm, compared with 5.8 cm on prior radiograph. IMPRESSION: No acute pulmonary infiltrate. Mild cardiomegaly. Suspected increased size of aneurysmal dilatation at the aortic arch; patient has known history of type a dissection. Given significant increase in size/change in appearance, suggest follow-up CT angiography. Critical Value/emergent results were called by telephone at the time of interpretation on 02/01/2021 at 6:06 pm to provider [facility] PATEL , who verbally acknowledged these results. Electronically Signed   By: Donavan Foil M.D.   On: 02/01/2021 18:06   DG Abd Portable 1V  Result Date: 02/01/2021 CLINICAL DATA:  Constipated EXAM: PORTABLE ABDOMEN - 1 VIEW COMPARISON:  CT 02/22/2016 FINDINGS: Diffusely calcified aorta. Nonobstructed gas pattern with moderate stool in the colon. Phleboliths in the pelvis. Advanced degenerative changes of both hips. IMPRESSION: Nonobstructed gas pattern with moderate stool in the colon. Electronically Signed   By: Donavan Foil M.D.   On: 02/01/2021 17:48   CT HEAD CODE STROKE WO CONTRAST  Result Date: 02/01/2021 CLINICAL DATA:  Code stroke.  Altered mental status EXAM: CT HEAD WITHOUT CONTRAST TECHNIQUE: Contiguous axial images were obtained from the base of the skull through the vertex without intravenous contrast. COMPARISON:  01/23/2021 FINDINGS: Brain: There is no acute intracranial hemorrhage or mass effect. No new loss of gray-white differentiation. Chronic infarcts of the left basal ganglia and adjacent white matter and right insula. Additional patchy and confluent areas of hypoattenuation in the supratentorial white matter likely reflects stable advanced chronic microvascular  ischemic changes. Prominence of the ventricles and sulci reflects stable parenchymal volume loss. No extra-axial collection. Vascular: No hyperdense vessel. There is intracranial atherosclerotic calcification at the skull base. Skull: Unremarkable. Sinuses/Orbits: No acute abnormality. Other: Mastoid air cells are clear. ASPECTS ([place] Stroke Program Early CT Score) - Ganglionic level infarction (caudate, lentiform nuclei, internal capsule, insula, M1-M3 cortex): 7 - Supraganglionic infarction (M4-M6 cortex): 3 Total score (0-10 with 10 being normal): 10 IMPRESSION: There is no acute intracranial hemorrhage or evidence of acute infarction. ASPECT score is 10. Stable chronic findings detailed above. These results were communicated to Dr. Lorrin Goodell at 1:06 pm on 02/01/2021 by text page via the [facility] messaging system. Electronically Signed   By: Macy Mis M.D.   On: 02/01/2021 13:09     Medical Consultants:    None.   Subjective:    Alexandria Walton relates she continues to have neck pain.  Objective:    Vitals:   02/02/21 0034 02/02/21 0227 02/02/21 0352 02/02/21 0736  BP: (!) 151/84 (!) 159/82 137/86 (!) 153/81  Pulse: 78 78 74 77  Resp:   18 18  Temp: 98 F (36.7 C)  98.2 F (36.8 C) 97.7 F (36.5 C)  TempSrc: Oral  Oral Oral  SpO2: 96% 96% 98% 97%  Weight:      Height:       SpO2: 97 %   Intake/Output Summary (Last 24 hours) at 02/02/2021 0853 Last data filed at 02/02/2021 0509 Gross per 24 hour  Intake 575 ml  Output 350 ml  Net 225 ml   Filed Weights   02/01/21 1233 02/01/21 1958  Weight: 57.6 kg  55 kg    Exam: General exam: In no acute distress, traumatic phase with bruise bilaterally eyes and face area Respiratory system: Good air movement and clear to auscultation. Cardiovascular system: S1 & S2 heard, RRR. No JVD. Gastrointestinal system: Abdomen is nondistended, soft and nontender.  Central nervous system: In a good mood awake alert oriented x3 nonfocal  physical exam. Extremities: No pedal edema. Skin: No rashes, lesions or ulcers Psychiatry: Judgment and insight appeared appropriate and normal mood and affect are appropriate.   Data Reviewed:    Labs: Basic Metabolic Panel: Recent Labs  Lab 02/01/21 1243 02/01/21 1252 02/02/21 0240  NA 141 143 140  K 4.6 4.6 4.5  CL 108 108 108  CO2 26  --  23  GLUCOSE 94 90 82  BUN 47* 50* 41*  CREATININE 2.29* 2.30* 1.78*  CALCIUM 9.6  --  9.3   GFR Estimated Creatinine Clearance: 18.6 mL/min (A) (by C-G formula based on SCr of 1.78 mg/dL (H)). Liver Function Tests: Recent Labs  Lab 02/01/21 1243 02/02/21 0240  AST 40 30  ALT 41 32  ALKPHOS 90 85  BILITOT 0.9 1.1  PROT 7.2 7.0  ALBUMIN 3.4* 3.3*   No results for input(s): LIPASE, AMYLASE in the last 168 hours. Recent Labs  Lab 02/01/21 1344  AMMONIA 29   Coagulation profile Recent Labs  Lab 02/01/21 1243  INR 1.2   COVID-19 Labs  No results for input(s): DDIMER, FERRITIN, LDH, CRP in the last 72 hours.  Lab Results  Component Value Date   [place] NEGATIVE 02/01/2021    CBC: Recent Labs  Lab 02/01/21 1243 02/01/21 1252 02/02/21 0240  WBC 9.3  --  9.1  NEUTROABS 6.2  --  5.8  HGB 10.9* 11.6* 10.5*  HCT 34.1* 34.0* 33.8*  MCV 94.5  --  94.7  PLT 204  --  194   Cardiac Enzymes: No results for input(s): CKTOTAL, CKMB, CKMBINDEX, TROPONINI in the last 168 hours. BNP (last 3 results) No results for input(s): PROBNP in the last 8760 hours. CBG: No results for input(s): GLUCAP in the last 168 hours. D-Dimer: No results for input(s): DDIMER in the last 72 hours. Hgb A1c: Recent Labs    02/02/21 0240  HGBA1C 5.3   Lipid Profile: Recent Labs    02/02/21 0240  CHOL 223*  HDL 52  LDLCALC 144*  TRIG 137  CHOLHDL 4.3   Thyroid function studies: No results for input(s): TSH, T4TOTAL, T3FREE, THYROIDAB in the last 72 hours.  Invalid input(s): FREET3 Anemia work up: No results for input(s):  VITAMINB12, FOLATE, FERRITIN, TIBC, IRON, RETICCTPCT in the last 72 hours. Sepsis Labs: Recent Labs  Lab 02/01/21 1243 02/02/21 0240  WBC 9.3 9.1   Microbiology Recent Results (from the past 240 hour(s))  Resp Panel by RT-PCR (Flu A&B, Covid) Nasopharyngeal Swab     Status: None   Collection Time: 02/01/21 12:43 PM   Specimen: Nasopharyngeal Swab; Nasopharyngeal(NP) swabs in vial transport medium  Result Value Ref Range Status   SARS Coronavirus 2 by RT PCR NEGATIVE NEGATIVE Final    Comment: (NOTE) SARS-CoV-2 target nucleic acids are NOT DETECTED.  The SARS-CoV-2 RNA is generally detectable in upper respiratory specimens during the acute phase of infection. The lowest concentration of SARS-CoV-2 viral copies this assay can detect is 138 copies/mL. A negative result does not preclude SARS-Cov-2 infection and should not be used as the sole basis for treatment or other patient management decisions. A negative result may  occur with  improper specimen collection/handling, submission of specimen other than nasopharyngeal swab, presence of viral mutation(s) within the areas targeted by this assay, and inadequate number of viral copies(<138 copies/mL). A negative result must be combined with clinical observations, patient history, and epidemiological information. The expected result is Negative.  Fact Sheet for Patients:  EntrepreneurPulse.com.au  Fact Sheet for Healthcare Providers:  IncredibleEmployment.be  This test is no t yet approved or cleared by the [place] FDA and  has been authorized for detection and/or diagnosis of SARS-CoV-2 by FDA under an Emergency Use Authorization (EUA). This EUA will remain  in effect (meaning this test can be used) for the duration of the COVID-19 declaration under Section 564(b)(1) of the Act, 21 U.S.C.section 360bbb-3(b)(1), unless the authorization is terminated  or revoked sooner.       Influenza  A by PCR NEGATIVE NEGATIVE Final   Influenza B by PCR NEGATIVE NEGATIVE Final    Comment: (NOTE) The Xpert Xpress SARS-CoV-2/FLU/RSV plus assay is intended as an aid in the diagnosis of influenza from Nasopharyngeal swab specimens and should not be used as a sole basis for treatment. Nasal washings and aspirates are unacceptable for Xpert Xpress SARS-CoV-2/FLU/RSV testing.  Fact Sheet for Patients: EntrepreneurPulse.com.au  Fact Sheet for Healthcare Providers: IncredibleEmployment.be  This test is not yet approved or cleared by the [place] FDA and has been authorized for detection and/or diagnosis of SARS-CoV-2 by FDA under an Emergency Use Authorization (EUA). This EUA will remain in effect (meaning this test can be used) for the duration of the COVID-19 declaration under Section 564(b)(1) of the Act, 21 U.S.C. section 360bbb-3(b)(1), unless the authorization is terminated or revoked.  Performed at [facility], [place] [address] [place], [place] [address]      Medications:   .  stroke: mapping our early stages of recovery book   Does not apply Once  . amoxicillin-clavulanate  1 tablet Oral BID  . aspirin  300 mg Rectal Daily   Or  . aspirin  325 mg Oral Daily  . docusate sodium  100 mg Oral BID  . heparin  5,000 Units Subcutaneous Q8H  . metoprolol succinate  100 mg Oral BID  . polyethylene glycol  17 g Oral Daily   Continuous Infusions: . sodium chloride 75 mL/hr at 02/02/21 0300      LOS: 0 days   Charlynne Cousins  [facility]  02/02/2021, 8:53 AM

## 2021-02-02 NOTE — Consult Note (Addendum)
   Telespecialist EEG Report  EEG Date 02/01/21 read 02/02/2021   Duration: 26:46  Clinical Indication:  Suspected seizure r/o epileptiform discharges  EEG Procedure:  This is a digitally recorded routine electroencephalogram. The international 10-20 electrode placement system is used for scalp electrode placement. Eighteen channels of scalp EEG are recorded The data are stored digitally and reviewed in reformatted montages for optimal display.  EEG Description: This EEG is well organized.  There was a well formed, well sustained and symmetrical 7.5-8 hz alpha rhythm that attenuated with eye opening and drowsiness .  The background consisted of a mixture of alpha and beta frequencies of low to medium voltage.  During drowsiness, background slowing appeared.  FIRDA (frontal intermittent rhythmic delta slowing was present and it is a normal finding in this age group.  Stage 2 sleep was not recorded.  EMG artifact was present during some portion of this exam precluding full evaluation of underlying eeg rhythms.  Intermittent polymorphic delta slowing was present in the right fronto-temporal region.  There were no epileptiform discharges.    Activating procedures:  These were not performed. EKG:   The EKG rhythm strip showed a NSR at 78bpm  EEG Classification:  Abnormal 1.  Intermittent polymorphic delta slowing, right, fronto-temporal  EEG Interpretation: This routine EEG recorded in the awake and drowsy s is abnormal.  The focal finding in the right fronto temporal region is suggestive of focal cerebral dysfunction in this region.  There were no epileptiform discharges present.

## 2021-02-02 NOTE — Progress Notes (Signed)
SLP Cancellation Note  Patient Details Name: Alexandria Walton MRN: [id number] DOB: 1929/03/13   Cancelled treatment:       Reason Eval/Treat Not Completed: SLP screened. Order was received for swallowing evaluation but pt has passed the [place] swallow screen and a PO diet was initiated. No concerns per RN, therefore will defer our evaluation. Will f/u for speech-language evaluation as able.     Osie Bond., M.A. [facility] Pager [phone] Office [phone]  02/02/2021, 4:43 PM

## 2021-02-02 NOTE — Progress Notes (Signed)
STROKE TEAM PROGRESS NOTE   INTERVAL HISTORY [place] 3/19 at home without clear reason. Family heard the fall and found her on the floor.   Type II Odontoid fracture seen on CT yesterday. Placed in C collar. Plan is management in C collar for the present. Her daughter  is at the bedside. We discussed plan of care, diagnostic findings and diagnosis. Questions were answered.    Vitals:   02/01/21 2301 02/02/21 0034 02/02/21 0227 02/02/21 0352  BP: 130/79 (!) 151/84 (!) 159/82 137/86  Pulse: 77 78 78 74  Resp:    18  Temp:  98 F (36.7 C)  98.2 F (36.8 C)  TempSrc:  Oral  Oral  SpO2:  96% 96% 98%  Weight:      Height:       CBC:  Recent Labs  Lab 02/01/21 1243 02/01/21 1252 02/02/21 0240  WBC 9.3  --  9.1  NEUTROABS 6.2  --  5.8  HGB 10.9* 11.6* 10.5*  HCT 34.1* 34.0* 33.8*  MCV 94.5  --  94.7  PLT 204  --  063   Basic Metabolic Panel:  Recent Labs  Lab 02/01/21 1243 02/01/21 1252 02/02/21 0240  NA 141 143 140  K 4.6 4.6 4.5  CL 108 108 108  CO2 26  --  23  GLUCOSE 94 90 82  BUN 47* 50* 41*  CREATININE 2.29* 2.30* 1.78*  CALCIUM 9.6  --  9.3   Lipid Panel:  Recent Labs  Lab 02/02/21 0240  CHOL 223*  TRIG 137  HDL 52  CHOLHDL 4.3  VLDL 27  LDLCALC 144*   HgbA1c:  Recent Labs  Lab 02/02/21 0240  HGBA1C 5.3   Urine Drug Screen: No results for input(s): LABOPIA, COCAINSCRNUR, LABBENZ, AMPHETMU, THCU, LABBARB in the last 168 hours.  Alcohol Level  Recent Labs  Lab 02/01/21 1243  ETH <10    IMAGING past 24 hours CT CHEST WO CONTRAST  Result Date: 02/01/2021 CLINICAL DATA:  Chest bruising after fall. Thoracic aortic aneurysm. EXAM: CT CHEST WITHOUT CONTRAST TECHNIQUE: Multidetector CT imaging of the chest was performed following the standard protocol without IV contrast. COMPARISON:  February 22, 2016. FINDINGS: Cardiovascular: Atherosclerosis and tortuosity of thoracic aorta is noted. There is aneurysmal dilatation of the distal transverse aortic arch  and proximal descending thoracic aorta with maximum measured diameter of 6.5 cm which is significantly enlarged compared to prior exam of 2017. This most likely represents enlarging dissection. 5.6 cm aneurysmal dilatation of the distal portion of the ascending thoracic aorta is noted which is enlarged compared to prior exam, also consistent with enlarging dissection. 4.8 cm proximal ascending thoracic aortic aneurysm is noted. Mild cardiomegaly is noted. No pericardial effusion is noted. Coronary artery calcifications are noted. Mediastinum/Nodes: No enlarged mediastinal or axillary lymph nodes. Thyroid gland, trachea, and esophagus demonstrate no significant findings. Lungs/Pleura: No pneumothorax or pleural effusion is noted. Minimal bibasilar subsegmental atelectasis is noted. Upper Abdomen: No acute abnormality. Musculoskeletal: No chest wall mass or suspicious bone lesions identified. IMPRESSION: 1. There is aneurysmal dilatation of the distal transverse aortic arch and proximal descending thoracic aorta with maximum measured diameter of 6.5 cm which is significantly enlarged compared to prior exam of 2017. This most likely represents enlarging dissection. CT angiography of the thoracic aorta is recommended for further evaluation. 2. 5.6 cm aneurysmal dilatation of the distal portion of the ascending thoracic aorta is noted which is enlarged compared to prior exam, also consistent with enlarging dissection. CT  angiography of thoracic aorta is recommended for further evaluation. 3. 4.8 cm proximal ascending thoracic aortic aneurysm is noted. 4. Coronary artery calcifications are noted suggesting coronary artery disease. 5. Aortic atherosclerosis. Aortic Atherosclerosis (ICD10-I70.0). Electronically Signed   By: Marijo Conception M.D.   On: 02/01/2021 19:33   CT CERVICAL SPINE WO CONTRAST  Result Date: 02/01/2021 CLINICAL DATA:  fall few weeks ago. entire face and chest is bruised. Neck pain Eval AA EXAM: CT  CERVICAL SPINE WITHOUT CONTRAST TECHNIQUE: Multidetector CT imaging of the cervical spine was performed without intravenous contrast. Multiplanar CT image reconstructions were also generated. COMPARISON:  CT cervical spine 10/25/2012 FINDINGS: Alignment: Normal. Skull base and vertebrae: Acute nondisplaced C2 fracture of the body of the axis (9:15-16, 8:24). Multilevel degenerative changes of the spine. No aggressive appearing focal osseous lesion or focal pathologic process. Soft tissues and spinal canal: No prevertebral fluid or swelling. No visible canal hematoma. Upper chest: Biapical pleural/pulmonary scarring. Other: Aneurysmal aorta with suggestion of dissection. Carotid artery atherosclerotic plaque within the neck. IMPRESSION: 1. Acute type III C2 fracture.  Please place C-collar. 2. Please see separately dictated CT chest 02/01/2021 regarding aneurysm/dissection of the aorta. These results were called by telephone at the time of interpretation on 02/01/2021 at 7:36 pm to provider PA Margarita Mail, who verbally acknowledged these results. Electronically Signed   By: Iven Finn M.D.   On: 02/01/2021 19:44   MR ANGIO HEAD WO CONTRAST  Result Date: 02/02/2021 CLINICAL DATA:  Transient ischemic attack EXAM: MRI HEAD WITHOUT CONTRAST MRA HEAD WITHOUT CONTRAST TECHNIQUE: Multiplanar, multiecho pulse sequences of the brain and surrounding structures were obtained without intravenous contrast. Angiographic images of the head were obtained using MRA technique without contrast. COMPARISON:  None. FINDINGS: MRI HEAD FINDINGS Brain: No acute infarct, mass effect or extra-axial collection. Multifocal chronic microhemorrhage. Confluent hyperintense T2-weighted white matter signal. Diffuse, severe atrophy. 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 The midline structures are normal. Vascular: Major flow voids are preserved. Skull and upper cervical spine: Normal calvarium and skull  base. Visualized upper cervical spine and soft tissues are normal. Sinuses/Orbits:No paranasal sinus fluid levels or advanced mucosal thickening. No mastoid or middle ear effusion. Normal orbits. MRA HEAD FINDINGS POSTERIOR CIRCULATION: --Vertebral arteries: Normal --Inferior cerebellar arteries: Normal. --Basilar artery: Normal. --Superior cerebellar arteries: Normal. --Posterior cerebral arteries: Normal. ANTERIOR CIRCULATION: --Intracranial internal carotid arteries: Normal. --Anterior cerebral arteries (ACA): Normal. --Middle cerebral arteries (MCA): Normal. ANATOMIC VARIANTS: Fetal origin of the left PCA. IMPRESSION: 1. No acute intracranial abnormality. 2. Severe atrophy and chronic small vessel disease. 3. Normal intracranial MRA. Electronically Signed   By: Ulyses Jarred M.D.   On: 02/02/2021 02:45   MR BRAIN WO CONTRAST  Result Date: 02/02/2021 CLINICAL DATA:  Transient ischemic attack EXAM: MRI HEAD WITHOUT CONTRAST MRA HEAD WITHOUT CONTRAST TECHNIQUE: Multiplanar, multiecho pulse sequences of the brain and surrounding structures were obtained without intravenous contrast. Angiographic images of the head were obtained using MRA technique without contrast. COMPARISON:  None. FINDINGS: MRI HEAD FINDINGS Brain: No acute infarct, mass effect or extra-axial collection. Multifocal chronic microhemorrhage. Confluent hyperintense T2-weighted white matter signal. Diffuse, severe atrophy. 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 The midline structures are normal. Vascular: Major flow voids are preserved. Skull and upper cervical spine: Normal calvarium and skull base. Visualized upper cervical spine and soft tissues are normal. Sinuses/Orbits:No paranasal sinus fluid levels or advanced mucosal thickening. No mastoid or middle ear effusion. Normal  orbits. MRA HEAD FINDINGS POSTERIOR CIRCULATION: --Vertebral arteries: Normal --Inferior cerebellar arteries: Normal. --Basilar artery:  Normal. --Superior cerebellar arteries: Normal. --Posterior cerebral arteries: Normal. ANTERIOR CIRCULATION: --Intracranial internal carotid arteries: Normal. --Anterior cerebral arteries (ACA): Normal. --Middle cerebral arteries (MCA): Normal. ANATOMIC VARIANTS: Fetal origin of the left PCA. IMPRESSION: 1. No acute intracranial abnormality. 2. Severe atrophy and chronic small vessel disease. 3. Normal intracranial MRA. Electronically Signed   By: Ulyses Jarred M.D.   On: 02/02/2021 02:45   DG CHEST PORT 1 VIEW  Result Date: 02/01/2021 CLINICAL DATA:  Chest pain EXAM: PORTABLE CHEST 1 VIEW COMPARISON:  04/15/2020, 11/28/2018 FINDINGS: No focal opacity or pleural effusion. There is mild cardiomegaly. Suspected increased size of aneurysmal dilatation and known dissection at the aortic arch with transverse arch diameter of approximate 6.8 cm, compared with 5.8 cm on prior radiograph. IMPRESSION: No acute pulmonary infiltrate. Mild cardiomegaly. Suspected increased size of aneurysmal dilatation at the aortic arch; patient has known history of type a dissection. Given significant increase in size/change in appearance, suggest follow-up CT angiography. Critical Value/emergent results were called by telephone at the time of interpretation on 02/01/2021 at 6:06 pm to provider [facility] PATEL , who verbally acknowledged these results. Electronically Signed   By: Donavan Foil M.D.   On: 02/01/2021 18:06   DG Abd Portable 1V  Result Date: 02/01/2021 CLINICAL DATA:  Constipated EXAM: PORTABLE ABDOMEN - 1 VIEW COMPARISON:  CT 02/22/2016 FINDINGS: Diffusely calcified aorta. Nonobstructed gas pattern with moderate stool in the colon. Phleboliths in the pelvis. Advanced degenerative changes of both hips. IMPRESSION: Nonobstructed gas pattern with moderate stool in the colon. Electronically Signed   By: Donavan Foil M.D.   On: 02/01/2021 17:48   CT HEAD CODE STROKE WO CONTRAST  Result Date: 02/01/2021 CLINICAL DATA:  Code  stroke.  Altered mental status EXAM: CT HEAD WITHOUT CONTRAST TECHNIQUE: Contiguous axial images were obtained from the base of the skull through the vertex without intravenous contrast. COMPARISON:  01/23/2021 FINDINGS: Brain: There is no acute intracranial hemorrhage or mass effect. No new loss of gray-white differentiation. Chronic infarcts of the left basal ganglia and adjacent white matter and right insula. Additional patchy and confluent areas of hypoattenuation in the supratentorial white matter likely reflects stable advanced chronic microvascular ischemic changes. Prominence of the ventricles and sulci reflects stable parenchymal volume loss. No extra-axial collection. Vascular: No hyperdense vessel. There is intracranial atherosclerotic calcification at the skull base. Skull: Unremarkable. Sinuses/Orbits: No acute abnormality. Other: Mastoid air cells are clear. ASPECTS ([place] Stroke Program Early CT Score) - Ganglionic level infarction (caudate, lentiform nuclei, internal capsule, insula, M1-M3 cortex): 7 - Supraganglionic infarction (M4-M6 cortex): 3 Total score (0-10 with 10 being normal): 10 IMPRESSION: There is no acute intracranial hemorrhage or evidence of acute infarction. ASPECT score is 10. Stable chronic findings detailed above. These results were communicated to Dr. Lorrin Goodell at 1:06 pm on 02/01/2021 by text page via the [facility] messaging system. Electronically Signed   By: Macy Mis M.D.   On: 02/01/2021 13:09   PHYSICAL EXAM Pleasant frail malnourished looking elderly lady not in distress.  She is wearing a cervical collar.  . Afebrile. Head is nontraumatic. Neck is supple without bruit.    Cardiac exam no murmur or gallop. Lungs are clear to auscultation. Distal pulses are well felt. Neurological Exam : She is awake alert oriented to time place and person.  Speech is clear though slightly hypophonic.  Hearing slightly diminished.  No facial weakness.  Tongue midline.  Motor system  exam symmetric upper extremity strength.  Mild bilateral lower extremity proximal symmetric weakness.  Good distal strength.  No focal weakness.  Sensation is preserved bilaterally.  Gait not tested  Impression  :Alexandria Walton is a 87 y.o. female with PMH significant for HTN, CKD2, HLD, AAA, OA, hx of PE, dementia, recent fall with nasal fracture with facial swelling who presents with dysarthria, RUE weakness and BL lower ext weakness and R facial droop. She went to bed at 2300 on 01/31/21 and woke up with these symptoms. Eventually brought in to the hospital by daughter when symptoms did not improve. Daughter reports that speech is much better and facial droop is gone.  Prior hx of stroke with R sided deficit but no to minimal residual deficit. Takes aspirin 81mg  daily at home.  At baseline, patient struggles with dressing undressing herself. Does not cook or do groceries. Can walk with a walker. Has memory impairment and is forgetful.  ASSESSMENT/PLAN Ms. KEZIYAH KNEALE is a 87 y.o. female with history of  NIHSS 5 Stroke like episode, likely TIA due to SVD History of recent fall with C2 fracture   CTH   Negative for a large hypodensity concerning for a large territory infarct or hyperdensity concerning for an ICH   MRI   No acute intracranial abnormality. 2. Severe atrophy and chronic small vessel disease   MRA  .Normal intracranial MRA.  CT chest wo contrast There is aneurysmal dilatation of the distal transverse aortic arch and proximal descending thoracic aorta with maximum measured diameter of 6.5 cm which is significantly enlarged compared to prior exam of 2017. This most likely represents enlarging dissection. CT angiography of the thoracic aorta is recommended for further evaluation. 5.6 cm aneurysmal dilatation of the distal portion of the ascending thoracic aorta is noted which is enlarged compared to prior exam, also consistent with enlarging dissection.  CT angiography of thoracic aorta is recommended for further evaluation. 4.8 cm proximal ascending thoracic aortic aneurysm is noted. Coronary artery calcifications are noted suggesting coronary artery disease. Aortic atherosclerosis.  CT cervical spine Acute type III C2 fracture.    Carotid Doppler Pending  EEG  No seizure: The focal finding in the right fronto temporal region is suggestive of focal cerebral dysfunction in this region. There were no epileptiform discharges present  2D Echo  EF 76-16%, Grade I diastolic dysfunction. Moderate LVH.  Severely dilated left atrium. No thrombus, wall motion abnormality or shunt found.    LDL 144  HgbA1c 5.3  VTE prophylaxis - per primary team    Diet   DIET DYS 3 Room service appropriate? Yes; Fluid consistency: Thin   On ASA 81mg  PTA will recommend she continue ASA 81mg  alone  in light of recent nasal and cervical fractures with extensive ecchymosis on entire face and extending down to neck.   Therapy recommendations:  SNF  Disposition:  SNF  Likely TIA   Hypertension . BP goal normotensive  Hyperlipidemia  Home meds: None  LDL 144, goal < 70  Lipitor 40mg     Other Stroke Risk Factors  Advanced Age >/= 71   Former Cigarette smoker quit 1994  Hx stroke/TIA  Coronary artery disease  Obstructive sleep apnea, on CPAP at home  Large aortic aneurysm and aortic artherosclerosis on CT chest.   Leave on ASA 81mg   Other Active Problems  Type II Odontoid fracture: Manage in Cervical collar immobilization for the present per Dr. Ellene Route.  Hospital day #  0  I have personally obtained history,examined this patient, reviewed notes, independently viewed imaging studies, participated in medical decision making and plan of care.ROS completed by me personally and pertinent positives fully documented  I have made any additions or clarifications directly to the above note. Agree with note above.  Patient presented with transient  symptoms of dysarthria right upper extremity numbness and mild bilateral leg weakness possibly posterior circulation TIA due to small vessel disease.  Neurovascular imaging is unremarkable.  Recommend check MRI scan cervical spine to rule out upper cervical spine injury following the recent fall and odontoid fracture.  Recommend aspirin for stroke prevention and aggressive risk factor modification.  Long discussion patient and daughter at the bedside and answered questions.  Discussed with Dr. Tammi Klippel.  Greater than 50% time during this 35-minute visit was spent on counseling and coordination of care about UTI and discussion of stroke prevention and answering questions.  Antony Contras, MD Medical Director [facility] Pager: [phone] 02/02/2021 4:00 PM   To contact Stroke Continuity provider, please refer to [email or web address] After hours, contact General Neurology

## 2021-02-02 NOTE — Progress Notes (Signed)
Orthopedic Tech Progress Note Patient Details:  Alexandria Walton May 27, 1929 [id number] Patient has collar Patient ID: [name], female   DOB: 1928/11/14, 87 y.o.   MRN: [id number]   [name] 02/02/2021, 6:35 PM

## 2021-02-02 NOTE — Plan of Care (Signed)
  Problem: Education: Goal: Knowledge of General Education information will improve Description: Including pain rating scale, medication(s)/side effects and non-pharmacologic comfort measures Outcome: Progressing   Problem: Health Behavior/Discharge Planning: Goal: Ability to manage health-related needs will improve Outcome: Progressing   Problem: Clinical Measurements: Goal: Ability to maintain clinical measurements within normal limits will improve Outcome: Progressing Goal: Will remain free from infection Outcome: Progressing Goal: Diagnostic test results will improve Outcome: Progressing Goal: Respiratory complications will improve Outcome: Progressing Goal: Cardiovascular complication will be avoided Outcome: Progressing   Problem: Activity: Goal: Risk for activity intolerance will decrease Outcome: Progressing   Problem: Nutrition: Goal: Adequate nutrition will be maintained Outcome: Progressing   Problem: Coping: Goal: Level of anxiety will decrease Outcome: Progressing   Problem: Elimination: Goal: Will not experience complications related to bowel motility Outcome: Progressing Goal: Will not experience complications related to urinary retention Outcome: Progressing   Problem: Pain Managment: Goal: General experience of comfort will improve Outcome: Progressing   Problem: Safety: Goal: Ability to remain free from injury will improve Outcome: Progressing   Problem: Skin Integrity: Goal: Risk for impaired skin integrity will decrease Outcome: Progressing   Problem: Education: Goal: Knowledge of disease or condition will improve Outcome: Progressing Goal: Knowledge of secondary prevention will improve Outcome: Progressing Goal: Knowledge of patient specific risk factors addressed and post discharge goals established will improve Outcome: Progressing   Problem: Coping: Goal: Will identify appropriate support needs Outcome: Progressing   Problem:  Self-Care: Goal: Ability to participate in self-care as condition permits will improve Outcome: Progressing Goal: Verbalization of feelings and concerns over difficulty with self-care will improve Outcome: Progressing Goal: Ability to communicate needs accurately will improve Outcome: Progressing   Problem: Nutrition: Goal: Risk of aspiration will decrease Outcome: Progressing Goal: Dietary intake will improve Outcome: Progressing   Problem: Ischemic Stroke/TIA Tissue Perfusion: Goal: Complications of ischemic stroke/TIA will be minimized Outcome: Progressing

## 2021-02-02 NOTE — TOC Initial Note (Signed)
Transition of Care [facility]) - Initial/Assessment Note    Patient Details  Name: Alexandria Walton MRN: [id number] Date of Birth: 05/14/1929  Transition of Care [facility] CM/SW Contact:    Alexandria Walton, [place] Work Phone Number: 02/02/2021, 3:57 PM  Clinical Narrative:    MSW Student spoke with patient's daughter about PT/OT recommending SNF. Daughter was in agreement and wants to find a facility in the [place] area. FL2 completed and faxed out. Awaiting bed offers.               Expected Discharge Plan: Skilled Nursing Facility Barriers to Discharge: Continued Medical Work up   Patient Goals and CMS Choice Patient states their goals for this hospitalization and ongoing recovery are:: To get well CMS Medicare.gov Compare Post Acute Care list provided to:: Patient Represenative (must comment) Choice offered to / list presented to : Adult Children  Expected Discharge Plan and Services Expected Discharge Plan: [place] arrangements for the past 2 months: Single Family Home                                      Prior Living Arrangements/Services Living arrangements for the past 2 months: Single Family Home Lives with:: Self   Do you feel safe going back to the place where you live?: Yes      Need for Family Participation in Patient Care: Yes (Comment) Care giver support system in place?: No (comment)   Criminal Activity/Legal Involvement Pertinent to Current Situation/Hospitalization: No - Comment as needed  Activities of Daily Living      Permission Sought/Granted Permission sought to share information with : Facility Retail banker granted to share information with : Yes, Verbal Permission Granted  Share Information with NAME: Alexandria Walton  Permission granted to share info w AGENCY: SNF  Permission granted to share info w Relationship: Daughter     Emotional Assessment Appearance:: Appears older than  stated age Attitude/Demeanor/Rapport: Unable to Assess Affect (typically observed): Unable to Assess Orientation: : Oriented to Self,Oriented to Place,Oriented to Situation Alcohol / Substance Use: Not Applicable Psych Involvement: No (comment)  Admission diagnosis:  Cough [R05.9] TIA (transient ischemic attack) [G45.9] Constipated [K59.00] Patient Active Problem List   Diagnosis Date Noted  . TIA (transient ischemic attack) 02/01/2021  . Bilateral impacted cerumen 03/11/2019  . Closed fracture of multiple pubic rami, right, sequela 08/13/2018  . Unspecified open wound, left foot, subsequent encounter   . AKI (acute kidney injury) ([place])   . Protein-calorie malnutrition, severe 07/26/2016  . Left foot infection 07/25/2016  . Cellulitis of left foot 07/25/2016  . Dementia ([place]) 07/25/2016  . DNR (do not resuscitate) 07/25/2016  . Normocytic anemia 07/25/2016  . Dissection of aorta, thoracic ([place]) 02/22/2016  . Hypertension 02/22/2016  . Tibial plateau fracture, left 02/22/2016  . Chronic kidney disease, stage 3 ([place]) 02/22/2016  . Left tibial fracture   . Memory loss 01/30/2013  . Unspecified sleep apnea 12/25/2012  . Headache(784.0) 12/25/2012   PCP:  Rogers Blocker, MD Pharmacy:   [id number] - [place] [place] [address] Phone: [phone] Fax: [phone]     Social Determinants of Health (SDOH) Interventions    Readmission Risk Interventions No flowsheet data found.

## 2021-02-02 NOTE — Evaluation (Signed)
Physical Therapy Evaluation Patient Details Name: Alexandria Walton MRN: [id number] DOB: 1929-03-24 Today's Date: 02/02/2021   History of Present Illness  Pt is 87 yo female who presents with R sided weakness. Pt also with recent fall and subsequent numbness below the knees. Pt with nasal bone fx and found to have C2 fx as well. MRI neg for acute changes. PMH: HTN, CKD3, CAD, GERD, OSA, HLD, AAA, DM.  Clinical Impression  Pt admitted with above diagnosis. Pt relays that her daughter has back issues and cannot give physical care at home. Pt was ambulatory with RW before falling. Currently very fearful of falling and requiring max A +2 to come to EOB and return to supine. Pt stood EOB with mod A +2 and took small side steps to [facility] with mod A +2 for support and posterior lean. Given current limitations recommend SNF for rehab before returning home. Pt and family agreeable to this.  Pt currently with functional limitations due to the deficits listed below (see PT Problem List). Pt will benefit from skilled PT to increase their independence and safety with mobility to allow discharge to the venue listed below.       Follow Up Recommendations SNF;Supervision/Assistance - 24 hour    Equipment Recommendations  None recommended by PT    Recommendations for Other Services       Precautions / Restrictions Precautions Precautions: Fall Precaution Comments: C2 fx Required Braces or Orthoses: Cervical Brace Cervical Brace: Hard collar;At all times Restrictions Weight Bearing Restrictions: No      Mobility  Bed Mobility Overal bed mobility: Needs Assistance Bed Mobility: Rolling;Sidelying to Sit;Sit to Supine Rolling: Max assist Sidelying to sit: Max assist;+2 for physical assistance   Sit to supine: Max assist;+2 for physical assistance   General bed mobility comments: pt able to bridge knees partially when cued, assist needed to complete task. Max A to LE's and trunk to come from SL to sitting.  Pt fearful of falling and requiring max A +2 to return to supine partly due to fear with position changes    Transfers Overall transfer level: Needs assistance Equipment used: 2 person hand held assist Transfers: Sit to/from Stand Sit to Stand: Mod assist;+2 physical assistance         General transfer comment: mod A +2 for power up and UE support in standing. Pt stood 2x, first time maintained posterior lean against  bed. Second time was able to take full wt on LE's  Ambulation/Gait Ambulation/Gait assistance: Mod assist;+2 physical assistance Gait Distance (Feet): 2 Feet Assistive device: 2 person hand held assist Gait Pattern/deviations: Step-to pattern Gait velocity: decreased Gait velocity interpretation: <1.31 ft/sec, indicative of household ambulator General Gait Details: side steps to [facility], difficulty picking up feet  Stairs            Wheelchair Mobility    Modified Rankin (Stroke Patients Only) Modified Rankin (Stroke Patients Only) Pre-Morbid Rankin Score: Moderately severe disability Modified Rankin: Severe disability     Balance Overall balance assessment: Needs assistance;History of Falls Sitting-balance support: Bilateral upper extremity supported;Feet supported Sitting balance-Leahy Scale: Poor Sitting balance - Comments: posterior lean in sitting with posterior pelvic tilt Postural control: Posterior lean Standing balance support: Bilateral upper extremity supported;During functional activity Standing balance-Leahy Scale: Poor Standing balance comment: reliant on UE and external support                             Pertinent  Vitals/Pain Pain Assessment: Faces Faces Pain Scale: Hurts little more Pain Location: neck Pain Descriptors / Indicators: Constant;Sore;Grimacing Pain Intervention(s): Limited activity within patient's tolerance;Monitored during session    Home Living Family/patient expects to be discharged to:: Private  residence Living Arrangements: Children Available Help at Discharge: Family;Available 24 hours/day Type of Home: House Home Access: Stairs to enter   [employer] of Steps: 2-4 and then another 1 Home Layout: One level Home Equipment: Walker - 2 wheels;Bedside commode;Shower seat Additional Comments: daughter lives with her and garnddaughter currently staying there too right now    Prior Function Level of Independence: Needs assistance   Gait / Transfers Assistance Needed: ambulates with RW per chart but pt reports she did not have it when she fell  ADL's / Homemaking Assistance Needed: has needed assist with bathing and dressing since she fell, prior to that daughter was laying clothes out for her        Hand Dominance   Dominant Hand: Right    Extremity/Trunk Assessment   Upper Extremity Assessment Upper Extremity Assessment: Defer to OT evaluation    Lower Extremity Assessment Lower Extremity Assessment: Generalized weakness;LLE deficits/detail LLE Deficits / Details: bruise noted L knee    Cervical / Trunk Assessment Cervical / Trunk Assessment: Kyphotic  Communication   Communication: No difficulties  Cognition Arousal/Alertness: Awake/alert Behavior During Therapy: WFL for tasks assessed/performed Overall Cognitive Status: No family/caregiver present to determine baseline cognitive functioning Area of Impairment: Memory;Following commands;Problem solving;Orientation                 Orientation Level: Time;Disoriented to;Situation   Memory: Decreased short-term memory;Decreased recall of precautions Following Commands: Follows one step commands consistently;Follows one step commands with increased time     Problem Solving: Slow processing;Difficulty sequencing;Requires verbal cues General Comments: has trouble remembering birth year and needs extra time for processing for all questions      General Comments General comments (skin integrity,  edema, etc.): family arrived end of session. Pt notes that daughter has back injury and cannot assist physically in the way she currently needs    Exercises     Assessment/Plan    PT Assessment Patient needs continued PT services  PT Problem List Decreased strength;Decreased activity tolerance;Decreased balance;Decreased mobility;Decreased cognition;Decreased knowledge of use of DME;Decreased knowledge of precautions;Decreased safety awareness;Pain       PT Treatment Interventions DME instruction;Gait training;Functional mobility training;Therapeutic activities;Therapeutic exercise;Balance training;Patient/family education    PT Goals (Current goals can be found in the Care Plan section)  Acute Rehab PT Goals Patient Stated Goal: get better PT Goal Formulation: With patient/family Time For Goal Achievement: 02/16/21 Potential to Achieve Goals: Good    Frequency Min 3X/week   Barriers to discharge        Co-evaluation PT/OT/SLP Co-Evaluation/Treatment: Yes Reason for Co-Treatment: Complexity of the patient's impairments (multi-system involvement);Necessary to address cognition/behavior during functional activity PT goals addressed during session: Mobility/safety with mobility;Balance         AM-PAC PT "6 Clicks" Mobility  Outcome Measure Help needed turning from your back to your side while in a flat bed without using bedrails?: A Lot Help needed moving from lying on your back to sitting on the side of a flat bed without using bedrails?: A Lot Help needed moving to and from a bed to a chair (including a wheelchair)?: A Lot Help needed standing up from a chair using your arms (e.g., wheelchair or bedside chair)?: A Lot Help needed to walk in hospital room?:  Total Help needed climbing 3-5 steps with a railing? : Total 6 Click Score: 10    End of Session Equipment Utilized During Treatment: Cervical collar Activity Tolerance: Patient limited by pain;Other (comment) (fear of  falling) Patient left: in bed;with call bell/phone within reach;with bed alarm set;with family/visitor present Nurse Communication: Mobility status PT Visit Diagnosis: Unsteadiness on feet (R26.81);Muscle weakness (generalized) (M62.81);Difficulty in walking, not elsewhere classified (R26.2);Pain Pain - part of body:  (neck)    Time: 9437-0052 PT Time Calculation (min) (ACUTE ONLY): 32 min   Charges:   PT Evaluation $PT Eval Moderate Complexity: [place]  Pager [phone] Office [place] 02/02/2021, 2:30 PM

## 2021-02-03 LAB — SARS CORONAVIRUS 2 (TAT 6-24 HRS): SARS Coronavirus 2: NEGATIVE

## 2021-02-03 NOTE — ED Notes (Cosign Needed)
I received a call from radiologist about a CTcspine ordered by Dr. Posey Pronto that shows a type 3 C2 spinal fracture. She also relayed that although she was not reading the ct chest she can see what appears to be a dissecting thoracic aortic aneurysm. I called [facility] floor coverage - Dr. Noberto Retort - to notify him of emergent findings.   Margarita Mail, PA-C 02/03/21 1233

## 2021-02-03 NOTE — Hospital Course (Addendum)
IMPRESSIONS     1. Moderate hypertrophy of the basal septum with otherwise mild  concentric LVH. Left ventricular ejection fraction, by estimation, is 60  to 65%. The left ventricle has normal function. The left ventricle has no  regional wall motion abnormalities. There  is moderate left ventricular hypertrophy of the basal-septal segment. Left  ventricular diastolic parameters are consistent with Grade I diastolic  dysfunction (impaired relaxation). Elevated left ventricular end-diastolic  pressure.   2. Right ventricular systolic function is normal. The right ventricular  size is normal. There is mildly elevated pulmonary artery systolic  pressure.   3. Left atrial size was severely dilated.   4. The mitral valve is degenerative. Mild mitral valve regurgitation. No  evidence of mitral stenosis.   5. The aortic valve is calcified. There is mild calcification of the  aortic valve. There is mild thickening of the aortic valve. Aortic valve  regurgitation is moderate. Mild aortic valve stenosis. Aortic  regurgitation PHT measures 436 msec. Aortic valve   mean gradient measures 11.0 mmHg. Aortic valve Vmax measures 2.17 m/s.   6. Aortic dilatation noted. There is moderate dilatation of the ascending  aorta, measuring 43 mm.   7. The inferior vena cava is dilated in size with >50% respiratory  variability, suggesting right atrial pressure of 8 mmHg.     type III odontoid fracture which is nondisplaced.  She also has a large aortic aneurysm on a CT of her chest.   Plan for treatment of the type III odontoid fracture is collar immobilization as all the fragments lined up well.     EEG Classification:  Abnormal 1.  Intermittent polymorphic delta slowing, right, fronto-temporal   EEG Interpretation: This routine EEG recorded in the awake and drowsy s is abnormal.  The focal finding in the right fronto temporal region is suggestive of focal cerebral dysfunction in this region.  There  were no epileptiform discharges present.     [facility]

## 2021-02-03 NOTE — Progress Notes (Signed)
STROKE TEAM PROGRESS NOTE   INTERVAL HISTORY No acute events overnight.  Sitting up in bed eating an orange without apparent difficulty.  No new concerns today. Continues to report expected cervicalgia. Wearing hard cervical collar.   Her daughter  is again at the bedside. We discussed MR spine findings, plan of care, and diagnosis. Questions were answered.    Vitals:   02/02/21 1520 02/02/21 2007 02/02/21 2345 02/03/21 0346  BP: 100/75 137/87 (!) 165/93 130/83  Pulse: 87 80 66 73  Resp: 16 16 18 18   Temp: 98.4 F (36.9 C) 97.9 F (36.6 C) 97.7 F (36.5 C) 98.1 F (36.7 C)  TempSrc:  Oral Oral Oral  SpO2: 97% 95% 96% 96%  Weight:      Height:       CBC:  Recent Labs  Lab 02/01/21 1243 02/01/21 1252 02/02/21 0240  WBC 9.3  --  9.1  NEUTROABS 6.2  --  5.8  HGB 10.9* 11.6* 10.5*  HCT 34.1* 34.0* 33.8*  MCV 94.5  --  94.7  PLT 204  --  559   Basic Metabolic Panel:  Recent Labs  Lab 02/01/21 1243 02/01/21 1252 02/02/21 0240  NA 141 143 140  K 4.6 4.6 4.5  CL 108 108 108  CO2 26  --  23  GLUCOSE 94 90 82  BUN 47* 50* 41*  CREATININE 2.29* 2.30* 1.78*  CALCIUM 9.6  --  9.3   Lipid Panel:  Recent Labs  Lab 02/02/21 0240  CHOL 223*  TRIG 137  HDL 52  CHOLHDL 4.3  VLDL 27  LDLCALC 144*   HgbA1c:  Recent Labs  Lab 02/02/21 0240  HGBA1C 5.3   Urine Drug Screen:  Recent Labs  Lab 02/01/21 2209  LABOPIA NONE DETECTED  COCAINSCRNUR NONE DETECTED  LABBENZ NONE DETECTED  AMPHETMU NONE DETECTED  THCU NONE DETECTED  LABBARB NONE DETECTED    Alcohol Level  Recent Labs  Lab 02/01/21 1243  ETH <10    IMAGING past 24 hours MR CERVICAL SPINE WO CONTRAST  Result Date: 02/02/2021 CLINICAL DATA:  Neck trauma status post fall EXAM: MRI CERVICAL SPINE WITHOUT CONTRAST TECHNIQUE: Multiplanar, multisequence MR imaging of the cervical spine was performed. No intravenous contrast was administered. COMPARISON:  None. FINDINGS: Alignment: Physiologic. Vertebrae:  No discitis or osteomyelitis. No aggressive osseous lesion. Nondisplaced type III fracture of the odontoid process. Cord: Normal signal and morphology. Posterior Fossa, vertebral arteries, paraspinal tissues: Posterior fossa demonstrates no focal abnormality. Vertebral artery flow voids are maintained. Paraspinal soft tissues are unremarkable. Disc levels: Discs: Degenerative disease with disc height loss at C4-5 and C7-T1. C2-3: No significant disc bulge. No neural foraminal stenosis. No central canal stenosis. C3-4: Broad-based disc bulge with a small central disc protrusion. Mild bilateral facet arthropathy. No foraminal stenosis. No central canal stenosis. C4-5: Broad-based disc osteophyte complex. No neural foraminal stenosis. No central canal stenosis. C5-6: Mild broad-based disc bulge. Bilateral uncovertebral degenerative changes. Moderate bilateral foraminal stenosis. No central canal stenosis. C6-7: Broad-based disc osteophyte complex. Bilateral uncovertebral degenerative changes. Severe left foraminal stenosis. Moderate right foraminal stenosis. No central canal stenosis. C7-T1: No significant disc bulge. No neural foraminal stenosis. No central canal stenosis. IMPRESSION: 1. Nondisplaced type III fracture of the odontoid process. 2. No other acute injury of the cervical spine. 3. Cervical spine spondylosis as described above. Electronically Signed   By: Kathreen Devoid   On: 02/02/2021 18:16   ECHOCARDIOGRAM COMPLETE  Result Date: 02/02/2021    ECHOCARDIOGRAM  REPORT   Patient Name:   ANESSA CHARLEY Date of Exam: 02/02/2021 Medical Rec #:  [id number]     Height:       67.0 in Accession #:    [id number]    Weight:       121.3 lb Date of Birth:  1929-10-30     BSA:          1.635 m Patient Age:    87 years      BP:           137/86 mmHg Patient Gender: F             HR:           71 bpm. Exam Location:  Inpatient Procedure: 2D Echo, Cardiac Doppler and Color Doppler Indications:    TIA, syncope  History:         Patient has no prior history of Echocardiogram examinations.                 CAD; Risk Factors:Dyslipidemia and Hypertension.  Sonographer:    Luisa Hart RDCS Referring Phys: [id number] [place]  1. Moderate hypertrophy of the basal septum with otherwise mild concentric LVH. Left ventricular ejection fraction, by estimation, is 60 to 65%. The left ventricle has normal function. The left ventricle has no regional wall motion abnormalities. There is moderate left ventricular hypertrophy of the basal-septal segment. Left ventricular diastolic parameters are consistent with Grade I diastolic dysfunction (impaired relaxation). Elevated left ventricular end-diastolic pressure.  2. Right ventricular systolic function is normal. The right ventricular size is normal. There is mildly elevated pulmonary artery systolic pressure.  3. Left atrial size was severely dilated.  4. The mitral valve is degenerative. Mild mitral valve regurgitation. No evidence of mitral stenosis.  5. The aortic valve is calcified. There is mild calcification of the aortic valve. There is mild thickening of the aortic valve. Aortic valve regurgitation is moderate. Mild aortic valve stenosis. Aortic regurgitation PHT measures 436 msec. Aortic valve  mean gradient measures 11.0 mmHg. Aortic valve Vmax measures 2.17 m/s.  6. Aortic dilatation noted. There is moderate dilatation of the ascending aorta, measuring 43 mm.  7. The inferior vena cava is dilated in size with >50% respiratory variability, suggesting right atrial pressure of 8 mmHg. FINDINGS  Left Ventricle: Moderate hypertrophy of the basal septum with otherwise mild concentric LVH. Left ventricular ejection fraction, by estimation, is 60 to 65%. The left ventricle has normal function. The left ventricle has no regional wall motion abnormalities. The left ventricular internal cavity size was normal in size. There is moderate left ventricular hypertrophy of the basal-septal  segment. Left ventricular diastolic parameters are consistent with Grade I diastolic dysfunction (impaired relaxation). Elevated left ventricular end-diastolic pressure. Right Ventricle: The right ventricular size is normal. No increase in right ventricular wall thickness. Right ventricular systolic function is normal. There is mildly elevated pulmonary artery systolic pressure. The tricuspid regurgitant velocity is 2.74  m/s, and with an assumed right atrial pressure of 8 mmHg, the estimated right ventricular systolic pressure is 91.6 mmHg. Left Atrium: Left atrial size was severely dilated. Right Atrium: Right atrial size was normal in size. Pericardium: There is no evidence of pericardial effusion. Mitral Valve: The mitral valve is degenerative in appearance. Mild mitral valve regurgitation. No evidence of mitral valve stenosis. Tricuspid Valve: The tricuspid valve is normal in structure. Tricuspid valve regurgitation is trivial. No evidence of tricuspid stenosis. Aortic Valve: The aortic valve  is calcified. There is mild calcification of the aortic valve. There is mild thickening of the aortic valve. Aortic valve regurgitation is moderate. Aortic regurgitation PHT measures 436 msec. Mild aortic stenosis is present. Aortic valve mean gradient measures 11.0 mmHg. Aortic valve peak gradient measures 18.8 mmHg. Aortic valve area, by VTI measures 2.34 cm. Pulmonic Valve: The pulmonic valve was normal in structure. Pulmonic valve regurgitation is not visualized. No evidence of pulmonic stenosis. Aorta: Aortic dilatation noted. There is moderate dilatation of the ascending aorta, measuring 43 mm. Venous: The inferior vena cava is dilated in size with greater than 50% respiratory variability, suggesting right atrial pressure of 8 mmHg. IAS/Shunts: No atrial level shunt detected by color flow Doppler.  LEFT VENTRICLE PLAX 2D LVIDd:         4.70 cm     Diastology LVIDs:         3.80 cm     LV e' medial:    2.50 cm/s LV  PW:         1.20 cm     LV E/e' medial:  20.3 LV IVS:        1.40 cm     LV e' lateral:   8.59 cm/s LVOT diam:     2.40 cm     LV E/e' lateral: 5.9 LV SV:         105 LV SV Index:   64 LVOT Area:     4.52 cm  LV Volumes (MOD) LV vol d, MOD A4C: 92.6 ml LV vol s, MOD A4C: 36.5 ml LV SV MOD A4C:     92.6 ml RIGHT VENTRICLE RV S prime:     13.60 cm/s  PULMONARY VEINS TAPSE (M-mode): 2.1 cm      A Reversal Duration: 106.00 msec                             A Reversal Velocity: 20.90 cm/s                             Diastolic Velocity:  86.76 cm/s                             S/D Velocity:        0.90                             Systolic Velocity:   72.09 cm/s LEFT ATRIUM             Index       RIGHT ATRIUM           Index LA Vol (A2C):   95.8 ml 58.60 ml/m RA Area:     11.20 cm LA Vol (A4C):   42.2 ml 25.81 ml/m RA Volume:   22.50 ml  13.76 ml/m LA Biplane Vol: 67.2 ml 41.11 ml/m  AORTIC VALVE AV Area (Vmax):    2.02 cm AV Area (Vmean):   2.52 cm AV Area (VTI):     2.34 cm AV Vmax:           217.00 cm/s AV Vmean:          124.920 cm/s AV VTI:            0.447 m AV Peak Grad:      18.8  mmHg AV Mean Grad:      11.0 mmHg LVOT Vmax:         97.00 cm/s LVOT Vmean:        69.500 cm/s LVOT VTI:          0.231 m LVOT/AV VTI ratio: 0.52 AI PHT:            436 msec  AORTA Ao Root diam: 4.50 cm Ao Asc diam:  4.30 cm MITRAL VALVE               TRICUSPID VALVE MV Area (PHT): 2.33 cm    TR Peak grad:   30.0 mmHg MV Decel Time: 325 msec    TR Vmax:        274.00 cm/s MR Peak grad: 111.2 mmHg MR Mean grad: 71.7 mmHg    SHUNTS MR Vmax:      527.33 cm/s  Systemic VTI:  0.23 m MR Vmean:     397.3 cm/s   Systemic Diam: 2.40 cm MV E velocity: 50.80 cm/s MV A velocity: 85.10 cm/s MV E/A ratio:  0.60 Skeet Latch MD Electronically signed by Skeet Latch MD Signature Date/Time: 02/02/2021/11:10:32 AM    Final    PHYSICAL EXAM Pleasant frail malnourished looking elderly lady not in distress.  She is wearing a cervical  collar.  . Afebrile. Head is nontraumatic. Neck is supple without bruit.    Cardiac exam no murmur or gallop. Lungs are clear to auscultation. Distal pulses are well felt. Neurological Exam : She is awake alert oriented to time place and person.  Speech is clear though slightly hypophonic.  Hearing slightly diminished.  No facial weakness.  Tongue midline.  Motor system exam symmetric upper extremity strength.  Mild bilateral lower extremity proximal symmetric weakness.  Good distal strength.  No focal weakness.  Sensation is preserved bilaterally.  Gait not tested  Impression  :KAYANA THOEN is a 87 y.o. female with PMH significant for HTN, CKD2, HLD, AAA, OA, hx of PE, dementia, recent fall with nasal fracture with facial swelling who presents with dysarthria, RUE weakness and BL lower ext weakness and R facial droop. She went to bed at 2300 on 01/31/21 and woke up with these symptoms. Eventually brought in to the hospital by daughter when symptoms did not improve. Daughter reports that speech is much better and facial droop is gone.  Prior hx of stroke with R sided deficit but no to minimal residual deficit. Takes aspirin 81mg  daily at home.  At baseline, patient struggles with dressing undressing herself. Does not cook or do groceries. Can walk with a walker. Has memory impairment and is forgetful.  ASSESSMENT/PLAN ALGIE CALES is a 87 y.o. female with PMH significant for HTN, CKD2, HLD, AAA, OA, hx of PE, dementia, recent fall with nasal fracture with facial swelling who presents with dysarthria, RUE weakness and BL lower ext weakness and R facial droop. She went to bed at 2300 on 01/31/21 and woke up with these symptoms. Eventually brought in to the hospital by daughter when symptoms did not improve. Daughter reports that speech is much better and facial droop is gone. NIHSS 5  Stroke like episode, likely TIA due to SVD History of recent fall with C2 fracture but MRI cervical spine does not  show any cord injury or contusion   CTH   Negative for a large hypodensity concerning for a large territory infarct or hyperdensity concerning for an ICH   MRI   No acute  intracranial abnormality. Severe atrophy and chronic small vessel disease   MRA  Normal intracranial MRA.  CT chest wo contrast There is aneurysmal dilatation of the distal transverse aortic arch and proximal descending thoracic aorta with maximum measured diameter of 6.5 cm which is significantly enlarged compared to prior exam of 2017. This most likely represents enlarging dissection. CT angiography of the thoracic aorta is recommended for further evaluation. 5.6 cm aneurysmal dilatation of the distal portion of the ascending thoracic aorta is noted which is enlarged compared to prior exam, also consistent with enlarging dissection. CT angiography of thoracic aorta is recommended for further evaluation. 4.8 cm proximal ascending thoracic aortic aneurysm is noted. Coronary artery calcifications are noted suggesting coronary artery disease. Aortic atherosclerosis.  CT cervical spine Acute type III C2 fracture.    Carotid Doppler Pending  EEG  No seizure: The focal finding in the right fronto temporal region is suggestive of focal cerebral dysfunction in this region. There were no epileptiform discharges present  2D Echo  EF 16-10%, Grade I diastolic dysfunction. Moderate LVH.  Severely dilated left atrium. No thrombus, wall motion abnormality or shunt found.    MR Cervical Spine  Nondisplaced type III fracture of the odontoid process. No other acute injury of the cervical spine. Cervical spine spondylosis  Hospital day # 1    LDL 144  HgbA1c 5.3  VTE prophylaxis - per primary team    Diet   DIET DYS 3 Room service appropriate? Yes; Fluid consistency: Thin   On ASA 81mg  PTA will recommend she continue ASA 81mg  alone  in light of recent nasal and cervical fractures with extensive ecchymosis on  entire face and extending down to neck.   Therapy recommendations:  SNF  Disposition:  SNF  Likely TIA   Hypertension . BP goal normotensive  Hyperlipidemia  Home meds: None  LDL 144, goal < 70  Lipitor 40mg     Other Stroke Risk Factors  Advanced Age >/= 45   Former Cigarette smoker quit 1994  Hx stroke/TIA  Coronary artery disease  Obstructive sleep apnea, on CPAP at home  Large aortic aneurysm and aortic artherosclerosis on CT chest.   Leave on ASA 81mg   Other Active Problems  Type II Odontoid fracture: Manage in Cervical collar immobilization for the present per Dr. Ellene Route.    Patient presented with transient symptoms of dysarthria right upper extremity numbness and mild bilateral leg weakness possibly posterior circulation TIA due to small vessel disease.  Neurovascular and cervical spine imaging is unremarkable.     Recommend aspirin for stroke prevention and aggressive risk factor modification.  Long discussion patient and daughter at the bedside and answered questions.  Discussed with Dr. Verlon Au.  Greater than 50% time during this 25-minute visit was spent on counseling and coordination of care about TIA and discussion of stroke prevention and answering questions. Stroke team will sign off.  Kindly call for questions Antony Contras, MD Medical Director [place] Pager: [phone] 02/03/2021 7:55 AM   To contact Stroke Continuity provider, please refer to [email or web address] After hours, contact General Neurology
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
## 2021-02-03 NOTE — TOC Progression Note (Signed)
Transition of Care [facility]) - Progression Note    Patient Details  Name: Alexandria Walton MRN: [id number] Date of Birth: September 25, 1929  Transition of Care [facility]) CM/SW Contact  Alexandria Walton, Alexandria Walton Work Phone Number: 02/03/2021, 2:21 PM  Clinical Narrative:   MSW Student provided bed offers to patients daughter Alexandria Walton, who voiced desire for [facility], [facility] or [facility]. Alexandria Walton was still pending so CSW inquired about referral. Later Alexandria Walton accepted the referral. Alexandria Walton is leaning towards [facility], CSW to start insurance authorization, will follow up.     Expected Discharge Plan: [place] Barriers to Discharge: Continued Medical Work up  Expected Discharge Plan and Services Expected Discharge Plan: [place] arrangements for the past 2 months: Single Family Home                                       Social Determinants of Health (SDOH) Interventions    Readmission Risk Interventions No flowsheet data found.

## 2021-02-04 ENCOUNTER — Encounter (HOSPITAL_COMMUNITY): Payer: Medicare Other

## 2021-02-04 LAB — RENAL FUNCTION PANEL
Albumin: 3 g/dL — ABNORMAL LOW (ref 3.5–5.0)
Anion gap: 6 (ref 5–15)
BUN: 37 mg/dL — ABNORMAL HIGH (ref 8–23)
CO2: 25 mmol/L (ref 22–32)
Calcium: 9.4 mg/dL (ref 8.9–10.3)
Chloride: 110 mmol/L (ref 98–111)
Creatinine, Ser: 1.65 mg/dL — ABNORMAL HIGH (ref 0.44–1.00)
GFR, Estimated: 30 mL/min — ABNORMAL LOW (ref >60)
Glucose, Bld: 85 mg/dL (ref 70–99)
Phosphorus: 2.9 mg/dL (ref 2.5–4.6)
Potassium: 4.1 mmol/L (ref 3.5–5.1)
Sodium: 141 mmol/L (ref 135–145)

## 2021-02-04 MED ORDER — ACETAMINOPHEN 325 MG PO TABS: 650.0000 mg | ORAL_TABLET | ORAL | Status: AC | PRN

## 2021-02-04 MED ORDER — ATORVASTATIN CALCIUM 80 MG PO TABS: 80.0000 mg | ORAL_TABLET | Freq: Every day | ORAL | Status: AC

## 2021-02-04 MED ORDER — POLYETHYLENE GLYCOL 3350 17 G PO PACK: 17.0000 g | PACK | Freq: Two times a day (BID) | ORAL | 0 refills | Status: AC

## 2021-02-04 NOTE — TOC Transition Note (Addendum)
Transition of Care [facility]) - CM/SW Discharge Note   Patient Details  Name: Alexandria Walton MRN: [id number] Date of Birth: 05/14/1929  Transition of Care [facility]) CM/SW Contact:  Geralynn Ochs, LCSW Phone Number: 02/04/2021, 10:51 AM   Clinical Narrative:   Nurse to call report to [phone], Room 123.  Please call daughter Arville Go when Corey Harold arrives, [phone].    Final next level of care: Skilled Nursing Facility Barriers to Discharge: Barriers Resolved   Patient Goals and CMS Choice Patient states their goals for this hospitalization and ongoing recovery are:: To get well CMS Medicare.gov Compare Post Acute Care list provided to:: Patient Represenative (must comment) Choice offered to / list presented to : Adult Children  Discharge Placement              Patient chooses bed at: [facility] Patient to be transferred to facility by: [place] Name of family member notified: [name] Patient and family notified of of transfer: 02/04/21  Discharge Plan and Services                                     Social Determinants of Health (SDOH) Interventions     Readmission Risk Interventions No flowsheet data found.

## 2021-02-04 NOTE — Progress Notes (Signed)
Report given to [place] at [facility]. Awaiting transport by [facility]

## 2021-02-04 NOTE — Discharge Summary (Signed)
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 moderate dilatation of the ascending aorta, measuring 43 mm. Venous: The inferior vena cava is dilated in size with greater than 50% respiratory variability, suggesting right atrial pressure of 8 mmHg. IAS/Shunts: No atrial level shunt detected by color flow Doppler.  LEFT VENTRICLE PLAX 2D LVIDd:         4.70 cm     Diastology LVIDs:         3.80 cm     LV e' medial:    2.50 cm/s LV PW:         1.20 cm     LV E/e' medial:  20.3 LV IVS:        1.40 cm     LV e' lateral:   8.59 cm/s LVOT diam:     2.40 cm     LV E/e' lateral: 5.9 LV SV:         105 LV SV Index:   64 LVOT Area:     4.52 cm  LV Volumes (MOD) LV vol d, MOD A4C: 92.6 ml LV vol s, MOD A4C: 36.5 ml LV SV MOD A4C:     92.6 ml RIGHT VENTRICLE RV S prime:     13.60 cm/s  PULMONARY VEINS TAPSE (M-mode): 2.1 cm      A Reversal Duration: 106.00 msec                             A Reversal Velocity: 20.90 cm/s                             Diastolic Velocity:  02.58 cm/s                             S/D Velocity:        0.90                             Systolic Velocity:   52.77 cm/s LEFT ATRIUM             Index       RIGHT ATRIUM           Index LA  Vol (A2C):   95.8 ml 58.60 ml/m RA Area:     11.20 cm LA Vol (A4C):   42.2 ml 25.81 ml/m RA Volume:   22.50 ml  13.76 ml/m LA Biplane Vol: 67.2 ml 41.11 ml/m  AORTIC VALVE AV Area (Vmax):    2.02 cm AV Area (Vmean):   2.52 cm AV Area (VTI):     2.34 cm AV Vmax:  217.00 cm/s AV Vmean:          124.920 cm/s AV VTI:            0.447 m AV Peak Grad:      18.8 mmHg AV Mean Grad:      11.0 mmHg LVOT Vmax:         97.00 cm/s LVOT Vmean:        69.500 cm/s LVOT VTI:          0.231 m LVOT/AV VTI ratio: 0.52 AI PHT:            436 msec  AORTA Ao Root diam: 4.50 cm Ao Asc diam:  4.30 cm MITRAL VALVE               TRICUSPID VALVE MV Area (PHT): 2.33 cm    TR Peak grad:   30.0 mmHg MV Decel Time: 325 msec    TR Vmax:        274.00 cm/s MR Peak grad: 111.2 mmHg MR Mean grad: 71.7 mmHg    SHUNTS MR Vmax:      527.33 cm/s  Systemic VTI:  0.23 m MR Vmean:     397.3 cm/s   Systemic Diam: 2.40 cm MV E velocity: 50.80 cm/s MV A velocity: 85.10 cm/s MV E/A ratio:  0.60 Skeet Latch MD Electronically signed by Skeet Latch MD Signature Date/Time: 02/02/2021/11:10:32 AM    Final    CT HEAD CODE STROKE WO CONTRAST  Result Date: 02/01/2021 CLINICAL DATA:  Code stroke.  Altered mental status EXAM: CT HEAD WITHOUT CONTRAST TECHNIQUE: Contiguous axial images were obtained from the base of the skull through the vertex without intravenous contrast. COMPARISON:  01/23/2021 FINDINGS: Brain: There is no acute intracranial hemorrhage or mass effect. No new loss of gray-white differentiation. Chronic infarcts of the left basal ganglia and adjacent white matter and right insula. Additional patchy and confluent areas of hypoattenuation in the supratentorial white matter likely reflects stable advanced chronic microvascular ischemic changes. Prominence of the ventricles and sulci reflects stable parenchymal volume loss. No extra-axial collection. Vascular: No hyperdense vessel. There is intracranial atherosclerotic  calcification at the skull base. Skull: Unremarkable. Sinuses/Orbits: No acute abnormality. Other: Mastoid air cells are clear. ASPECTS (Rewey Stroke Program Early CT Score) - Ganglionic level infarction (caudate, lentiform nuclei, internal capsule, insula, M1-M3 cortex): 7 - Supraganglionic infarction (M4-M6 cortex): 3 Total score (0-10 with 10 being normal): 10 IMPRESSION: There is no acute intracranial hemorrhage or evidence of acute infarction. ASPECT score is 10. Stable chronic findings detailed above. These results were communicated to Dr. Lorrin Goodell at 1:06 pm on 02/01/2021 by text page via the [facility] messaging system. Electronically Signed   By: Macy Mis M.D.   On: 02/01/2021 13:09   CT Maxillofacial Wo Contrast  Result Date: 01/23/2021 CLINICAL DATA:  87 year old female with facial trauma. EXAM: CT HEAD WITHOUT CONTRAST CT MAXILLOFACIAL WITHOUT CONTRAST TECHNIQUE: Multidetector CT imaging of the head and maxillofacial structures were performed using the standard protocol without intravenous contrast. Multiplanar CT image reconstructions of the maxillofacial structures were also generated. COMPARISON:  Head CT dated 02/12/2020. FINDINGS: CT HEAD FINDINGS Brain: Moderate age-related atrophy and chronic microvascular ischemic changes. Old left basal ganglia infarct. There is no acute intracranial hemorrhage. No mass effect midline shift. No extra-axial fluid collection. Vascular: No hyperdense vessel or unexpected calcification. Skull: Normal. Negative for fracture or focal lesion. Other: Small forehead hematoma. CT MAXILLOFACIAL FINDINGS Osseous: Mildly displaced acute fractures of the nasal bone.  There is old-appearing fracture of the left zygomatic arch. Age indeterminate fracture of the left orbital floor, possibly old. There is also age indeterminate fracture of the right lamina Propecia likely old. Clinical correlation is recommended. No other acute fracture. No mandibular subluxation. Orbits:  The globes and retro-orbital fat are preserved. Sinuses: Mild mucoperiosteal thickening of paranasal sinuses. No air-fluid level. Partial opacification of the right mastoid air cells. The left mastoid air cells are clear. Soft tissues: Soft tissue swelling over the nose. IMPRESSION: 1. No acute intracranial pathology. 2. Age-related atrophy and chronic microvascular ischemic changes. Old left basal ganglia infarct. 3. Mildly displaced acute fractures of the nasal bone. 4. Age indeterminate fractures of the left orbital floor, right lamina Propecia, and left zygomatic arch. Clinical correlation is recommended. Electronically Signed   By: Anner Crete M.D.   On: 01/23/2021 22:13    Microbiology: Recent Results (from the past 240 hour(s))  Resp Panel by RT-PCR (Flu A&B, Covid) Nasopharyngeal Swab     Status: None   Collection Time: 02/01/21 12:43 PM   Specimen: Nasopharyngeal Swab; Nasopharyngeal(NP) swabs in vial transport medium  Result Value Ref Range Status   SARS Coronavirus 2 by RT PCR NEGATIVE NEGATIVE Final    Comment: (NOTE) SARS-CoV-2 target nucleic acids are NOT DETECTED.  The SARS-CoV-2 RNA is generally detectable in upper respiratory specimens during the acute phase of infection. The lowest concentration of SARS-CoV-2 viral copies this assay can detect is 138 copies/mL. A negative result does not preclude SARS-Cov-2 infection and should not be used as the sole basis for treatment or other patient management decisions. A negative result may occur with  improper specimen collection/handling, submission of specimen other than nasopharyngeal swab, presence of viral mutation(s) within the areas targeted by this assay, and inadequate number of viral copies(<138 copies/mL). A negative result must be combined with clinical observations, patient history, and epidemiological information. The expected result is Negative.  Fact Sheet for Patients:   EntrepreneurPulse.com.au  Fact Sheet for Healthcare Providers:  IncredibleEmployment.be  This test is no t yet approved or cleared by the [place] FDA and  has been authorized for detection and/or diagnosis of SARS-CoV-2 by FDA under an Emergency Use Authorization (EUA). This EUA will remain  in effect (meaning this test can be used) for the duration of the COVID-19 declaration under Section 564(b)(1) of the Act, 21 U.S.C.section 360bbb-3(b)(1), unless the authorization is terminated  or revoked sooner.       Influenza A by PCR NEGATIVE NEGATIVE Final   Influenza B by PCR NEGATIVE NEGATIVE Final    Comment: (NOTE) The Xpert Xpress SARS-CoV-2/FLU/RSV plus assay is intended as an aid in the diagnosis of influenza from Nasopharyngeal swab specimens and should not be used as a sole basis for treatment. Nasal washings and aspirates are unacceptable for Xpert Xpress SARS-CoV-2/FLU/RSV testing.  Fact Sheet for Patients: EntrepreneurPulse.com.au  Fact Sheet for Healthcare Providers: IncredibleEmployment.be  This test is not yet approved or cleared by the [place] FDA and has been authorized for detection and/or diagnosis of SARS-CoV-2 by FDA under an Emergency Use Authorization (EUA). This EUA will remain in effect (meaning this test can be used) for the duration of the COVID-19 declaration under Section 564(b)(1) of the Act, 21 U.S.C. section 360bbb-3(b)(1), unless the authorization is terminated or revoked.  Performed at [facility], [place] [address], [place], [place] [address]   SARS CORONAVIRUS 2 (TAT 6-24 HRS) Nasopharyngeal Nasopharyngeal Swab     Status: None  Collection Time: 02/03/21  4:43 PM   Specimen: Nasopharyngeal Swab  Result Value Ref Range Status   SARS Coronavirus 2 NEGATIVE NEGATIVE Final    Comment: (NOTE) SARS-CoV-2 target nucleic acids are NOT DETECTED.  The  SARS-CoV-2 RNA is generally detectable in upper and lower respiratory specimens during the acute phase of infection. Negative results do not preclude SARS-CoV-2 infection, do not rule out co-infections with other pathogens, and should not be used as the sole basis for treatment or other patient management decisions. Negative results must be combined with clinical observations, patient history, and epidemiological information. The expected result is Negative.  Fact Sheet for Patients: SugarRoll.be  Fact Sheet for Healthcare Providers: https://www.woods-mathews.com/  This test is not yet approved or cleared by the [place] FDA and  has been authorized for detection and/or diagnosis of SARS-CoV-2 by FDA under an Emergency Use Authorization (EUA). This EUA will remain  in effect (meaning this test can be used) for the duration of the COVID-19 declaration under Se ction 564(b)(1) of the Act, 21 U.S.C. section 360bbb-3(b)(1), unless the authorization is terminated or revoked sooner.  Performed at [facility], [place] [address] [place], [place] [address]      Labs: Basic Metabolic Panel: Recent Labs  Lab 02/01/21 1243 02/01/21 1252 02/02/21 0240 02/04/21 0329  NA 141 143 140 141  K 4.6 4.6 4.5 4.1  CL 108 108 108 110  CO2 26  --  23 25  GLUCOSE 94 90 82 85  BUN 47* 50* 41* 37*  CREATININE 2.29* 2.30* 1.78* 1.65*  CALCIUM 9.6  --  9.3 9.4  PHOS  --   --   --  2.9   Liver Function Tests: Recent Labs  Lab 02/01/21 1243 02/02/21 0240 02/04/21 0329  AST 40 30  --   ALT 41 32  --   ALKPHOS 90 85  --   BILITOT 0.9 1.1  --   PROT 7.2 7.0  --   ALBUMIN 3.4* 3.3* 3.0*   No results for input(s): LIPASE, AMYLASE in the last 168 hours. Recent Labs  Lab 02/01/21 1344  AMMONIA 29   CBC: Recent Labs  Lab 02/01/21 1243 02/01/21 1252 02/02/21 0240  WBC 9.3  --  9.1  NEUTROABS 6.2  --  5.8  HGB 10.9* 11.6* 10.5*  HCT  34.1* 34.0* 33.8*  MCV 94.5  --  94.7  PLT 204  --  194   Cardiac Enzymes: No results for input(s): CKTOTAL, CKMB, CKMBINDEX, TROPONINI in the last 168 hours. BNP: BNP (last 3 results) Recent Labs    04/15/20 1526  BNP 320.4*    ProBNP (last 3 results) No results for input(s): PROBNP in the last 8760 hours.  CBG: No results for input(s): GLUCAP in the last 168 hours.     Signed:  Nita Sells MD   [facility] 02/04/2021, 10:06 AM

## 2021-02-04 NOTE — Progress Notes (Signed)
Physical Therapy Treatment Patient Details Name: Alexandria Walton MRN: [id number] DOB: Dec 07, 1928 Today's Date: 02/04/2021    History of Present Illness Pt is 87 yo female who presents with R sided weakness. Pt also with recent fall and subsequent numbness below the knees. Pt with nasal bone fx and found to have C2 fx as well. MRI neg for acute changes. PMH: HTN, CKD3, CAD, GERD, OSA, HLD, AAA, DM.    PT Comments    Session limited by incontinence. Patient required maxA+2 for bed mobility and modA+2 for sit to stand transfer with HHAx2. Upon standing, patient with bowel incontinence. Returned to supine as patient with request of bed pan and immediate need. Rolling L<>R with maxA for pericare and bed pan placement. Continue to recommend SNF for ongoing Physical Therapy.       Follow Up Recommendations  SNF;Supervision/Assistance - 24 hour     Equipment Recommendations  None recommended by PT    Recommendations for Other Services       Precautions / Restrictions Precautions Precautions: Fall Precaution Comments: C2 fx Required Braces or Orthoses: Cervical Brace Cervical Brace: Hard collar;At all times Restrictions Weight Bearing Restrictions: No    Mobility  Bed Mobility Overal bed mobility: Needs Assistance Bed Mobility: Rolling;Sidelying to Sit;Sit to Supine Rolling: Max assist Sidelying to sit: Max assist;+2 for physical assistance   Sit to supine: Max assist;+2 for physical assistance   General bed mobility comments: maxA to initiate rolling and maxA+2 for sidelying to sit and sit to supine. MaxA for rolling L<>R for pericare and bedpan placement    Transfers Overall transfer level: Needs assistance Equipment used: 2 person hand held assist Transfers: Sit to/from Stand Sit to Stand: Mod assist;+2 physical assistance         General transfer comment: modA+2 for power up and UE support. Upon standing, patient incontinent of bowels and returned to sitting with patient  requesting bed pan  Ambulation/Gait                 Stairs             Wheelchair Mobility    Modified Rankin (Stroke Patients Only) Modified Rankin (Stroke Patients Only) Pre-Morbid Rankin Score: Moderately severe disability Modified Rankin: Severe disability     Balance Overall balance assessment: Needs assistance;History of Falls Sitting-balance support: Bilateral upper extremity supported;Feet supported Sitting balance-Leahy Scale: Poor Sitting balance - Comments: posterior lean in sitting Postural control: Posterior lean Standing balance support: Bilateral upper extremity supported;During functional activity Standing balance-Leahy Scale: Poor Standing balance comment: reliant on UE and external support                            Cognition Arousal/Alertness: Awake/alert Behavior During Therapy: WFL for tasks assessed/performed Overall Cognitive Status: No family/caregiver present to determine baseline cognitive functioning Area of Impairment: Memory;Following commands;Problem solving;Orientation                 Orientation Level: Disoriented to;Situation   Memory: Decreased short-term memory;Decreased recall of precautions Following Commands: Follows one step commands consistently;Follows one step commands with increased time     Problem Solving: Slow processing;Difficulty sequencing;Requires verbal cues;Decreased initiation        Exercises      General Comments        Pertinent Vitals/Pain Pain Assessment: No/denies pain    Home Living  Prior Function            PT Goals (current goals can now be found in the care plan section) Acute Rehab PT Goals Patient Stated Goal: get better PT Goal Formulation: With patient/family Time For Goal Achievement: 02/16/21 Potential to Achieve Goals: Good Progress towards PT goals: Progressing toward goals    Frequency    Min 3X/week      PT  Plan Current plan remains appropriate    Co-evaluation              AM-PAC PT "6 Clicks" Mobility   Outcome Measure  Help needed turning from your back to your side while in a flat bed without using bedrails?: A Lot Help needed moving from lying on your back to sitting on the side of a flat bed without using bedrails?: A Lot Help needed moving to and from a bed to a chair (including a wheelchair)?: A Lot Help needed standing up from a chair using your arms (e.g., wheelchair or bedside chair)?: A Lot Help needed to walk in hospital room?: Total Help needed climbing 3-5 steps with a railing? : Total 6 Click Score: 10    End of Session Equipment Utilized During Treatment: Cervical collar;Gait belt Activity Tolerance: Other (comment) (incontinence) Patient left: in bed;with call bell/phone within reach;Other (comment) (MD in room) Nurse Communication: Mobility status;Other (comment) (on bed pan) PT Visit Diagnosis: Unsteadiness on feet (R26.81);Muscle weakness (generalized) (M62.81);Difficulty in walking, not elsewhere classified (R26.2);Pain     Time: 6301-6010 PT Time Calculation (min) (ACUTE ONLY): 23 min  Charges:  $Therapeutic Activity: 23-37 mins                     Alexandria Walton A. Gilford Rile PT, DPT Acute Rehabilitation Services Pager [phone] Office [phone]    Linna Hoff 02/04/2021, 12:59 PM

## 2021-03-12 ENCOUNTER — Ambulatory Visit: Payer: Medicare Other | Admitting: Podiatry

## 2021-05-07 DEATH — deceased

## 2021-06-30 IMAGING — MR MR MRA HEAD W/O CM
8 of 12 series · 25 of 48 positions shown · non-contrast
Comparison: None.

CLINICAL DATA: Transient ischemic attack

EXAM:
MRI HEAD WITHOUT CONTRAST
MRA HEAD WITHOUT CONTRAST
TECHNIQUE: Multiplanar, multiecho pulse sequences of the brain and surrounding
structures were obtained without intravenous contrast. Angiographic
images of the head were obtained using MRA technique without
contrast.

[Series 2: DWI · axial · 3.0mm · 0.94mm/px · z∈[-31,+105]mm · 7 of 102 slices shown (1 of 3)]
[im 1/102]
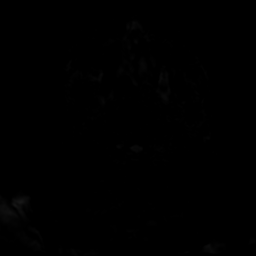
[im 17/102]
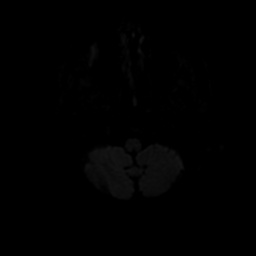
[im 34/102]
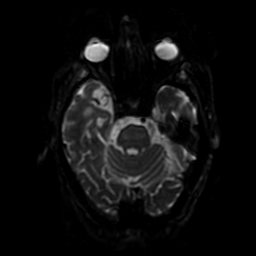
[im 51/102]
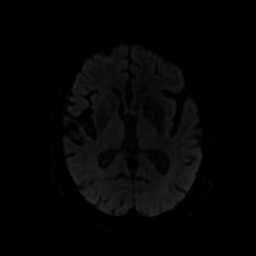
[im 68/102]
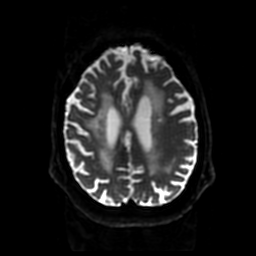
[im 85/102]
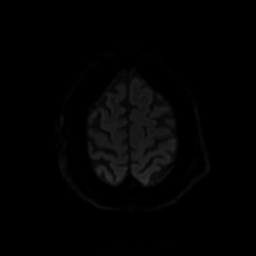
[im 102/102]
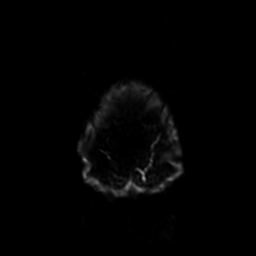

[Series 4: DWI · coronal · 4.0mm · 0.94mm/px · 4 of 70 slices shown (2 of 3)]
[im 1/70]
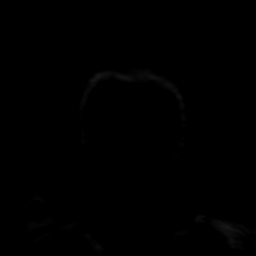
[im 24/70]
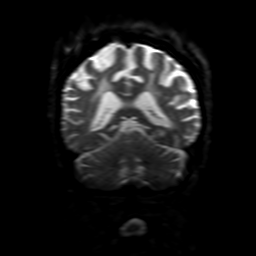
[im 47/70]
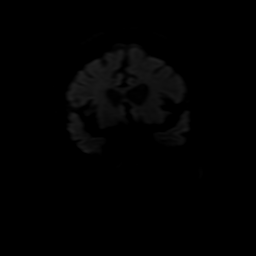
[im 70/70]
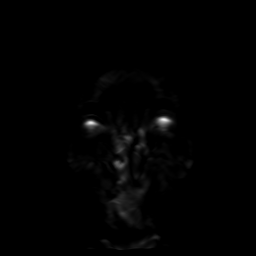

[Series 5: FLAIR · sagittal · 5.0mm · 0.23mm/px · 1 of 23 slices shown (1 of 2)]
[im 1/23]
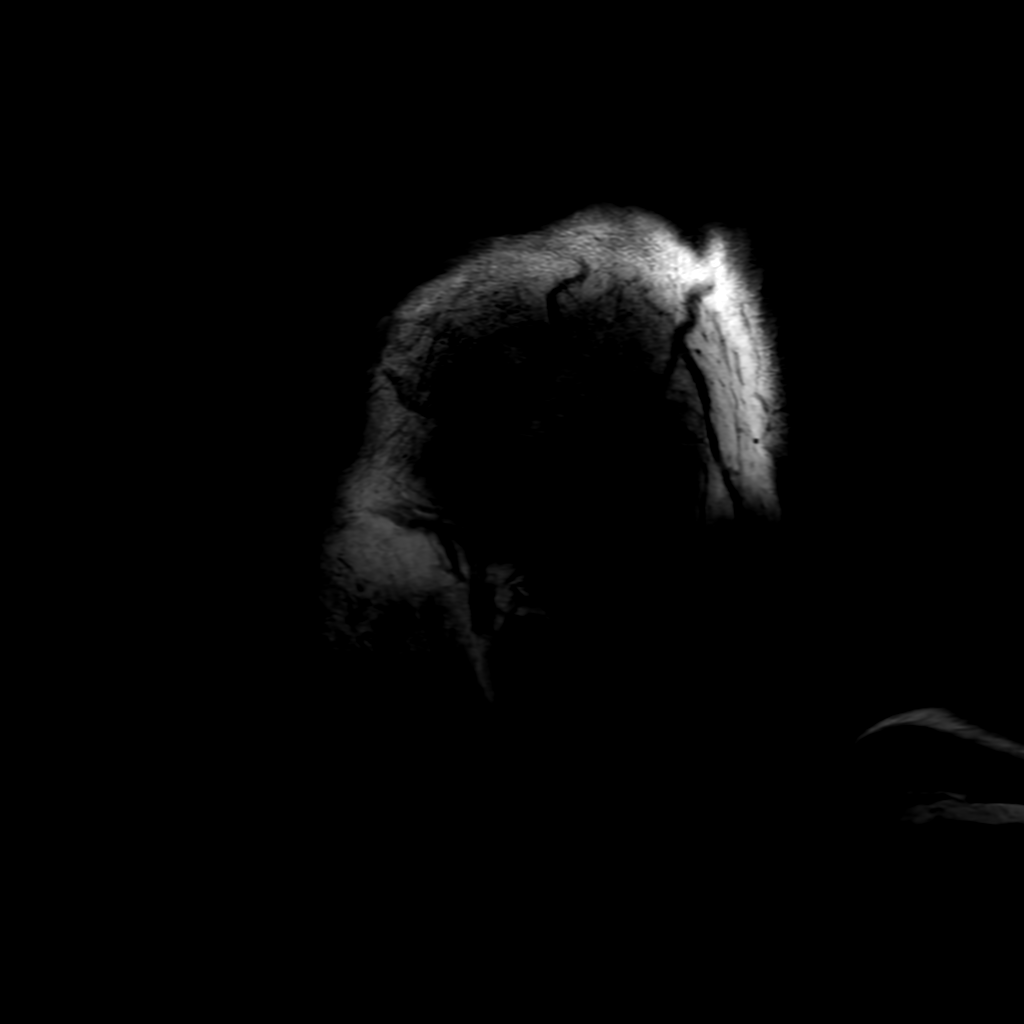

[Series 6: T2 · axial · 5.0mm · 0.23mm/px · z∈[-48,+96]mm · 2 of 26 slices shown]
[im 1/26]
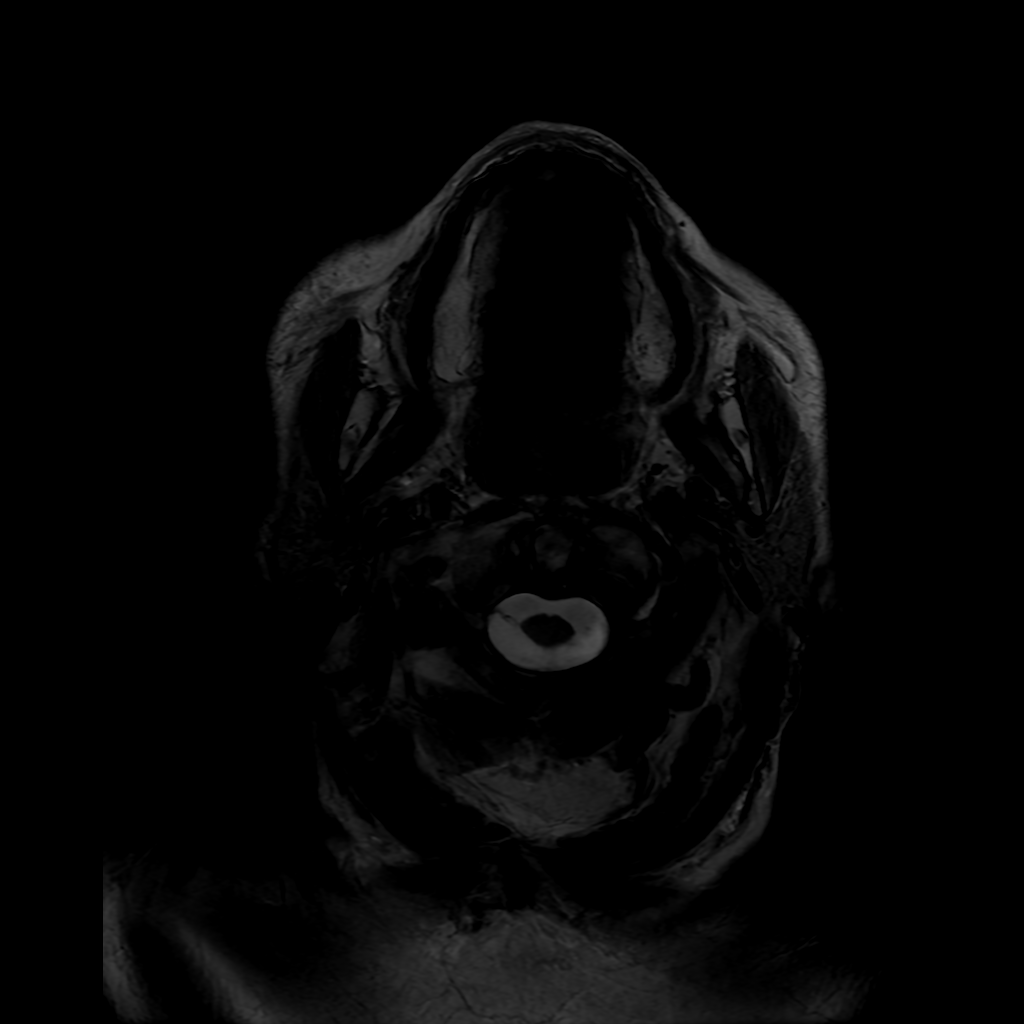
[im 26/26]
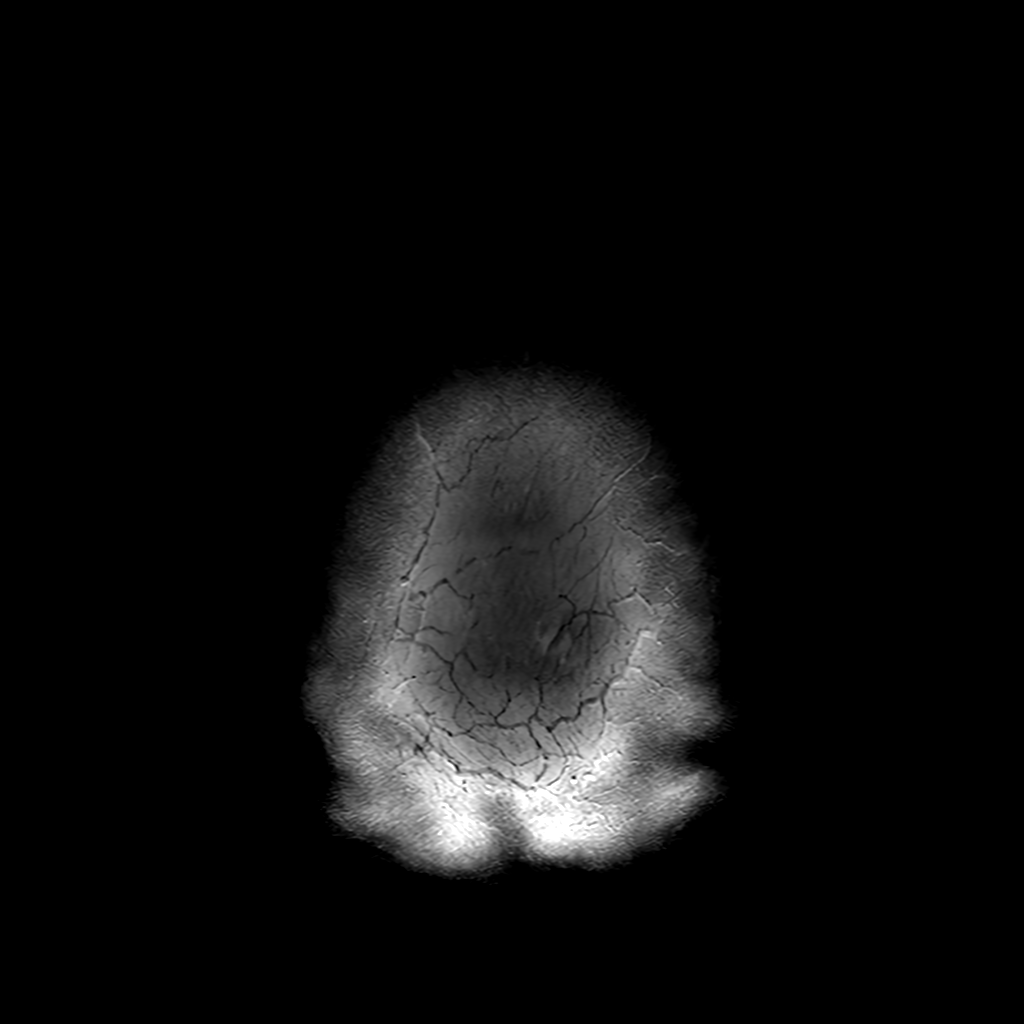

[Series 7: FLAIR · axial · 3.0mm · 0.45mm/px · z∈[-49,+94]mm · 2 of 26 slices shown (2 of 2)]
[im 1/26]
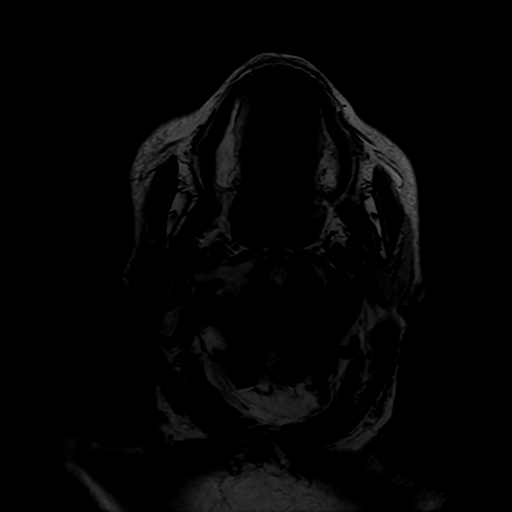
[im 26/26]
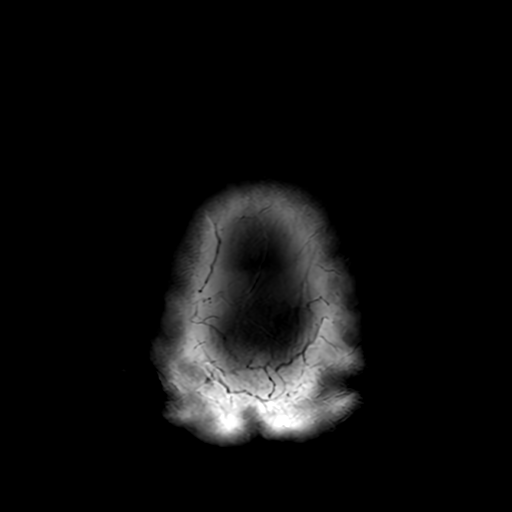

[Series 250: ADC · axial · 3.0mm · 0.94mm/px · z∈[-31,+105]mm · 3 of 51 slices shown (1 of 2)]
[im 1/51]
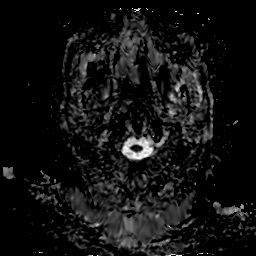
[im 26/51]
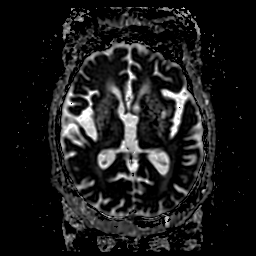
[im 51/51]
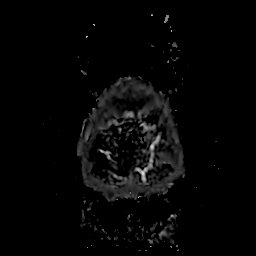

[Series 410: DWI · coronal · 4.0mm · 0.94mm/px · 4 of 69 slices shown (3 of 3)]
[im 1/69]
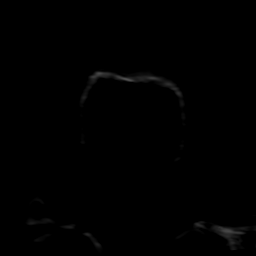
[im 23/69]
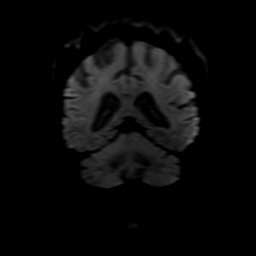
[im 46/69]
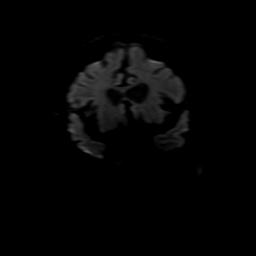
[im 69/69]
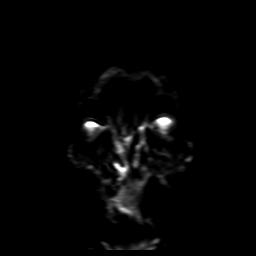

[Series 450: ADC · coronal · 4.0mm · 0.94mm/px · 2 of 35 slices shown (2 of 2)]
[im 1/35]
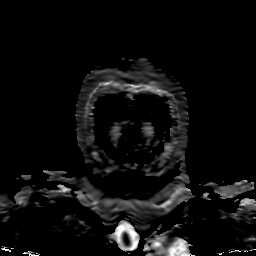
[im 35/35]
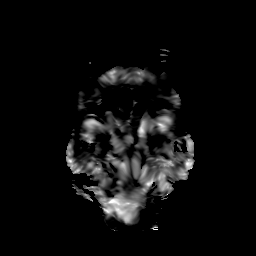

[25 of 48 positions shown; findings below may reference images not displayed]

FINDINGS: MRI HEAD FINDINGS

Brain: No acute infarct, mass effect or extra-axial collection.
Multifocal chronic microhemorrhage. Confluent hyperintense
T2-weighted white matter signal. Diffuse, severe atrophy. Old
bilateral cerebellar infarcts and old left basal ganglia infarct.
The midline structures are normal.

Vascular: Major flow voids are preserved.

Skull and upper cervical spine: Normal calvarium and skull base.
Visualized upper cervical spine and soft tissues are normal.

Sinuses/Orbits:No paranasal sinus fluid levels or advanced mucosal
thickening. No mastoid or middle ear effusion. Normal orbits.

MRA HEAD FINDINGS

POSTERIOR CIRCULATION:

--Vertebral arteries: Normal

--Inferior cerebellar arteries: Normal.

--Basilar artery: Normal.

--Superior cerebellar arteries: Normal.

--Posterior cerebral arteries: Normal.

ANTERIOR CIRCULATION:

--Intracranial internal carotid arteries: Normal.

--Anterior cerebral arteries (ACA): Normal.

--Middle cerebral arteries (MCA): Normal.

ANATOMIC VARIANTS: Fetal origin of the left PCA.
IMPRESSION: 1. No acute intracranial abnormality.
2. Severe atrophy and chronic small vessel disease.
3. Normal intracranial MRA.
